# Patient Record
Sex: Female | Born: 1990
Health system: Southern US, Community
[De-identification: ages and names within clinical notes are randomized; demographics above are authoritative.]

## PROBLEM LIST (undated history)

## (undated) DIAGNOSIS — F419 Anxiety disorder, unspecified: Secondary | ICD-10-CM

## (undated) DIAGNOSIS — N979 Female infertility, unspecified: Secondary | ICD-10-CM

## (undated) DIAGNOSIS — A159 Respiratory tuberculosis unspecified: Secondary | ICD-10-CM

## (undated) HISTORY — PX: BREAST SURGERY: SHX581

## (undated) HISTORY — DX: Respiratory tuberculosis unspecified: A15.9

## (undated) HISTORY — DX: Female infertility, unspecified: N97.9

---

## 2017-04-09 ENCOUNTER — Emergency Department (HOSPITAL_COMMUNITY)
Admission: EM | Admit: 2017-04-09 | Discharge: 2017-04-09 | Disposition: A | Discharge status: Home / Self Care | Payer: Self-pay | Attending: Emergency Medicine | Admitting: Emergency Medicine

## 2017-04-09 ENCOUNTER — Encounter (HOSPITAL_COMMUNITY): Payer: Self-pay | Admitting: Emergency Medicine

## 2017-04-09 DIAGNOSIS — B373 Candidiasis of vulva and vagina: Secondary | ICD-10-CM | POA: Insufficient documentation

## 2017-04-09 DIAGNOSIS — B3731 Acute candidiasis of vulva and vagina: Secondary | ICD-10-CM

## 2017-04-09 LAB — WET PREP, GENITAL
Clue Cells Wet Prep HPF POC: NONE SEEN
Sperm: NONE SEEN
Trich, Wet Prep: NONE SEEN

## 2017-04-09 MED ORDER — TERCONAZOLE 0.8 % VA CREA: 1.0000 | TOPICAL_CREAM | Freq: Every day | VAGINAL | 0 refills | Status: DC

## 2017-04-09 MED ORDER — FLUCONAZOLE 150 MG PO TABS
150.0000 mg | ORAL_TABLET | Freq: Once | ORAL | Status: AC
  Administered 2017-04-09: 150 mg via ORAL
  Filled 2017-04-09: qty 1

## 2017-04-09 NOTE — ED Provider Notes (Signed)
[facility] EMERGENCY DEPARTMENT Provider Note   CSN: [id number] Arrival date & time: 04/09/17  1956     History   Chief Complaint Chief Complaint  Patient presents with  . vaginal itching    HPI Nancy Hurst is a 27 y.o. female who presents to the ED for vaginal itching and burning for 2 days. Patient denies vaginal bleeding or discharge. Patient is sexually active. No hx of STI's.   HPI  History reviewed. No pertinent past medical history.  There are no active problems to display for this patient.   History reviewed. No pertinent surgical history.  OB History    No data available       Home Medications    Prior to Admission medications   Medication Sig Start Date End Date Taking? Authorizing Provider  terconazole (TERAZOL 3) 0.8 % vaginal cream Place 1 applicator vaginally at bedtime. 04/09/17   Janne Napoleon, NP    Family History No family history on file.  Social History Social History   Tobacco Use  . Smoking status: Never Smoker  . Smokeless tobacco: Never Used  Substance Use Topics  . Alcohol use: No    Frequency: Never  . Drug use: No     Allergies   Patient has no allergy information on record.   Review of Systems Review of Systems  Genitourinary: Positive for vaginal pain.  All other systems reviewed and are negative.    Physical Exam Updated Vital Signs BP 107/64 (BP Location: Right Arm)   Pulse 68   Temp 98.4 F (36.9 C) (Oral)   Resp 12   Ht 5\' 4"  (1.626 m)   Wt 43 kg (94 lb 12.8 oz)   LMP 03/16/2017 (Exact Date)   SpO2 100%   BMI 16.27 kg/m   Physical Exam  Constitutional: She appears well-developed and well-nourished. No distress.  HENT:  Head: Normocephalic.  Eyes: EOM are normal.  Neck: Neck supple.  Cardiovascular: Normal rate.  Pulmonary/Chest: Effort normal.  Abdominal: Soft. There is no tenderness.  Genitourinary:  Genitourinary Comments: External genitalia without lesions, thick white  cheesy d/c vaginal vault. No CMT, no adnexal tenderness, uterus not enlarged.   Musculoskeletal: Normal range of motion.  Neurological: She is alert.  Skin: Skin is warm and dry.  Psychiatric: She has a normal mood and affect.  Nursing note and vitals reviewed.    ED Treatments / Results  Labs (all labs ordered are listed, but only abnormal results are displayed) Labs Reviewed  WET PREP, GENITAL - Abnormal; Notable for the following components:      Result Value   Yeast Wet Prep HPF POC PRESENT (*)    WBC, Wet Prep HPF POC MANY (*)    All other components within normal limits  GC/CHLAMYDIA PROBE AMP ([place]) NOT AT [facility]  WET PREP  (BD AFFIRM) ([place])    Radiology No results found.  Procedures Procedures (including critical care time)  Medications Ordered in ED Medications  fluconazole (DIFLUCAN) tablet 150 mg (150 mg Oral Given 04/09/17 2253)     Initial Impression / Assessment and Plan / ED Course  I have reviewed the triage vital signs and the nursing notes. 27 y.o. female with vaginal itching stable for d/c. Will treat for yeast infection. Discussed with the patient that cultures are pending and someone will call if she needs additional treatment.   Final Clinical Impressions(s) / ED Diagnoses   Final diagnoses:  Monilial vaginitis  ED Discharge Orders        Ordered    terconazole (TERAZOL 3) 0.8 % vaginal cream  Daily at bedtime     04/09/17 2228       Kerrie Buffaloeese, [place], [place] 04/09/17 2255    Margarita Grizzleay, Danielle, MD 04/10/17 [phone]

## 2017-04-09 NOTE — ED Triage Notes (Signed)
Reports having vaginal itching and burning for two days.  Denies having any discharge.

## 2017-04-11 LAB — GC/CHLAMYDIA PROBE AMP (~~LOC~~) NOT AT ARMC
Chlamydia: NEGATIVE
Neisseria Gonorrhea: NEGATIVE

## 2017-10-31 ENCOUNTER — Emergency Department (HOSPITAL_COMMUNITY): Payer: Self-pay

## 2017-10-31 ENCOUNTER — Emergency Department (HOSPITAL_COMMUNITY)
Admission: EM | Admit: 2017-10-31 | Discharge: 2017-10-31 | Disposition: A | Discharge status: Home / Self Care | Payer: Self-pay | Attending: Emergency Medicine | Admitting: Emergency Medicine

## 2017-10-31 ENCOUNTER — Encounter (HOSPITAL_COMMUNITY): Payer: Self-pay | Admitting: Emergency Medicine

## 2017-10-31 DIAGNOSIS — N739 Female pelvic inflammatory disease, unspecified: Secondary | ICD-10-CM | POA: Insufficient documentation

## 2017-10-31 LAB — COMPREHENSIVE METABOLIC PANEL
ALT: 11 U/L (ref 0–44)
AST: 18 U/L (ref 15–41)
Albumin: 4 g/dL (ref 3.5–5.0)
Alkaline Phosphatase: 39 U/L (ref 38–126)
Anion gap: 10 (ref 5–15)
BUN: 13 mg/dL (ref 6–20)
CHLORIDE: 107 mmol/L (ref 98–111)
CO2: 22 mmol/L (ref 22–32)
CREATININE: 0.6 mg/dL (ref 0.44–1.00)
Calcium: 9 mg/dL (ref 8.9–10.3)
GFR calc non Af Amer: >60 mL/min (ref >60)
Glucose, Bld: 104 mg/dL — ABNORMAL HIGH (ref 70–99)
Potassium: 3.6 mmol/L (ref 3.5–5.1)
SODIUM: 139 mmol/L (ref 135–145)
Total Bilirubin: 0.8 mg/dL (ref 0.3–1.2)
Total Protein: 8 g/dL (ref 6.5–8.1)

## 2017-10-31 LAB — CBC
HCT: 39.2 % (ref 36.0–46.0)
Hemoglobin: 13.1 g/dL (ref 12.0–15.0)
MCH: 29.8 pg (ref 26.0–34.0)
MCHC: 33.4 g/dL (ref 30.0–36.0)
MCV: 89.3 fL (ref 78.0–100.0)
PLATELETS: 429 10*3/uL — AB (ref 150–400)
RBC: 4.39 MIL/uL (ref 3.87–5.11)
RDW: 14.3 % (ref 11.5–15.5)
WBC: 12.6 10*3/uL — ABNORMAL HIGH (ref 4.0–10.5)

## 2017-10-31 LAB — URINALYSIS, ROUTINE W REFLEX MICROSCOPIC
BILIRUBIN URINE: NEGATIVE
Bacteria, UA: NONE SEEN
GLUCOSE, UA: NEGATIVE mg/dL
KETONES UR: 5 mg/dL — AB
NITRITE: NEGATIVE
PH: 5 (ref 5.0–8.0)
Protein, ur: 100 mg/dL — AB
SPECIFIC GRAVITY, URINE: 1.026 (ref 1.005–1.030)

## 2017-10-31 LAB — WET PREP, GENITAL
CLUE CELLS WET PREP: NONE SEEN
SPERM: NONE SEEN
Trich, Wet Prep: NONE SEEN
Yeast Wet Prep HPF POC: NONE SEEN

## 2017-10-31 LAB — LIPASE, BLOOD: LIPASE: 30 U/L (ref 11–51)

## 2017-10-31 LAB — I-STAT BETA HCG BLOOD, ED (MC, WL, AP ONLY): I-stat hCG, quantitative: <5 m[IU]/mL (ref <5)

## 2017-10-31 MED ORDER — DOXYCYCLINE HYCLATE 100 MG PO TABS
100.0000 mg | ORAL_TABLET | Freq: Once | ORAL | Status: AC
  Administered 2017-10-31: 100 mg via ORAL
  Filled 2017-10-31: qty 1

## 2017-10-31 MED ORDER — KETOROLAC TROMETHAMINE 30 MG/ML IJ SOLN
30.0000 mg | Freq: Once | INTRAMUSCULAR | Status: AC
  Administered 2017-10-31: 30 mg via INTRAVENOUS
  Filled 2017-10-31: qty 1

## 2017-10-31 MED ORDER — ONDANSETRON 4 MG PO TBDP
4.0000 mg | ORAL_TABLET | Freq: Once | ORAL | Status: AC | PRN
  Administered 2017-10-31: 4 mg via ORAL
  Filled 2017-10-31: qty 1

## 2017-10-31 MED ORDER — NAPROXEN 500 MG PO TABS: 500.0000 mg | ORAL_TABLET | Freq: Two times a day (BID) | ORAL | 0 refills | Status: AC

## 2017-10-31 MED ORDER — MORPHINE SULFATE (PF) 4 MG/ML IV SOLN
4.0000 mg | Freq: Once | INTRAVENOUS | Status: AC
  Administered 2017-10-31: 4 mg via INTRAVENOUS
  Filled 2017-10-31: qty 1

## 2017-10-31 MED ORDER — IOPAMIDOL (ISOVUE-300) INJECTION 61%
INTRAVENOUS | Status: AC
  Filled 2017-10-31: qty 100

## 2017-10-31 MED ORDER — IOPAMIDOL (ISOVUE-300) INJECTION 61%
100.0000 mL | Freq: Once | INTRAVENOUS | Status: AC | PRN
  Administered 2017-10-31: 100 mL via INTRAVENOUS

## 2017-10-31 MED ORDER — CEFTRIAXONE SODIUM 250 MG IJ SOLR
250.0000 mg | Freq: Once | INTRAMUSCULAR | Status: AC
  Administered 2017-10-31: 250 mg via INTRAMUSCULAR
  Filled 2017-10-31: qty 250

## 2017-10-31 MED ORDER — ONDANSETRON 4 MG PO TBDP: 4.0000 mg | ORAL_TABLET | Freq: Three times a day (TID) | ORAL | 0 refills | Status: DC | PRN

## 2017-10-31 MED ORDER — AZITHROMYCIN 250 MG PO TABS
1000.0000 mg | ORAL_TABLET | Freq: Once | ORAL | Status: AC
  Administered 2017-10-31: 1000 mg via ORAL
  Filled 2017-10-31: qty 4

## 2017-10-31 MED ORDER — DOXYCYCLINE HYCLATE 100 MG PO CAPS: 100.0000 mg | ORAL_CAPSULE | Freq: Two times a day (BID) | ORAL | 0 refills | Status: AC

## 2017-10-31 MED ORDER — SODIUM CHLORIDE 0.9 % IV BOLUS
1000.0000 mL | Freq: Once | INTRAVENOUS | Status: AC
  Administered 2017-10-31: 1000 mL via INTRAVENOUS

## 2017-10-31 NOTE — Discharge Instructions (Addendum)
You were evaluated in the emergency room for abdominal pain.   You have pelvic inflammatory disease.  Your gonorrhea and chlamydia tests are pending, you will be called in 2-3 days if they are positive only.    Take doxycyline until completed.   For pain you can take acetaminophen (tylenol) [phone] mg every 6 hours PLUS naproxen 500 mg every 12 hours.   Return for fevers, chills, sweats, worsening pain.  Usted fue evaluado en la sala de emergencias por dolor abdominal.   Tiene enfermedad inflamatoria plvica. Sus pruebas de [place] y clamidia estn pendientes, se Engineer, structuralle llamar en 2-3 das si solo son positivas. Tome Albertson'sdoxiciclina hasta que se complete.   Para el dolor, puede tomar acetaminophen (tylenol) [phone] mg cada 6 horas MS naproxeno 500 mg cada 12 horas. Regrese por fiebres, escalofros, sudores, empeoramiento del dolor.

## 2017-10-31 NOTE — ED Notes (Signed)
Patient transported to CT 

## 2017-10-31 NOTE — ED Triage Notes (Signed)
Per [facility] pt from home for abd pain with nausea. Pt currently on her menstrual cycle.

## 2017-10-31 NOTE — ED Provider Notes (Signed)
[facility]-EMERGENCY DEPT Provider Note   CSN: [id number] Arrival date & time: 10/31/17  1434     History   Chief Complaint Chief Complaint  Patient presents with  . Abdominal Pain    HPI Nancy Hurst is a 27 y.o. female with history of colitis is here for evaluation of abdominal pain.  Pain is described as severe, constant, localized to the lower abdomen and around the bellybutton.  Pain began at 2 AM.  She took Advil without relief.  Associated symptoms include nausea, none bilious nonbloody emesis.  Reports history of colitis many years ago and this feels similar but slightly worse.  Has not had anything to eat today.  Denies fevers, hematemesis, changes in bowel movements, dysuria, urinary frequency, bladder pressure, abnormal vaginal discharge or bleeding.  Last BM was 7 AM today, normal.  Menstrual cycle began yesterday.  She is sexually active with one female partner without condom use.  Remote h/o of pelvic infection but not sure what type.  4 family members at bedside. Spanish speaking only. No previous pregnancies. No recent pelvic procedures.   HPI  History reviewed. No pertinent past medical history.  There are no active problems to display for this patient.   History reviewed. No pertinent surgical history.   OB History   None      Home Medications    Prior to Admission medications   Medication Sig Start Date End Date Taking? Authorizing Provider  ibuprofen (ADVIL,MOTRIN) 200 MG tablet Take 600 mg by mouth every 6 (six) hours as needed for mild pain.   Yes [provider]  doxycycline (VIBRAMYCIN) 100 MG capsule Take 1 capsule (100 mg total) by mouth 2 (two) times daily for 14 days. 10/31/17 11/14/17  Liberty Handy, PA-C  naproxen (NAPROSYN) 500 MG tablet Take 1 tablet (500 mg total) by mouth 2 (two) times daily for 14 days. 10/31/17 11/14/17  Liberty Handy, PA-C  ondansetron (ZOFRAN ODT) 4 MG disintegrating  tablet Take 1 tablet (4 mg total) by mouth every 8 (eight) hours as needed for nausea or vomiting. 10/31/17   Liberty Handy, PA-C    Family History No family history on file.  Social History Social History   Tobacco Use  . Smoking status: Not on file  Substance Use Topics  . Alcohol use: Not on file  . Drug use: Not on file     Allergies   Patient has no known allergies.   Review of Systems Review of Systems  Gastrointestinal: Positive for abdominal pain, nausea and vomiting.  Genitourinary: Positive for vaginal bleeding (menstrual cycle).  All other systems reviewed and are negative.    Physical Exam Updated Vital Signs BP (!) 87/59   Pulse 94   Resp 18   LMP 10/31/2017   SpO2 100%   Physical Exam  Constitutional: She is oriented to person, place, and time. She appears well-developed and well-nourished.  Nontoxic but appears uncomfortable.  HENT:  Head: Normocephalic and atraumatic.  Nose: Nose normal.  Eyes: Pupils are equal, round, and reactive to light. Conjunctivae and EOM are normal.  Neck: Normal range of motion.  Cardiovascular: Normal rate and regular rhythm.  Pulmonary/Chest: Effort normal and breath sounds normal.  Abdominal: Soft. Bowel sounds are normal. There is tenderness in the right lower quadrant, periumbilical area, suprapubic area and left lower quadrant.  Diffuse lower abdominal tenderness.  Mild guarding throughout.  Positive McBurney's.  Active bowel sounds to lower quadrants.  Soft.  No CVA tenderness.  Genitourinary: Cervix exhibits motion tenderness. There is bleeding in the vagina.  Genitourinary Comments:  External genitalia normal without tenderness, lesions.  No groin lymphadenopathy.  Cervix closed with dark blood, moderate blood in vaginal vault.  Marked CMT.  Non palpable adnexa. Pelvic done with RN at bedside.  Musculoskeletal: Normal range of motion.  Neurological: She is alert and oriented to person, place, and time.    Skin: Skin is warm and dry. Capillary refill takes less than 2 seconds.  Psychiatric: She has a normal mood and affect. Her behavior is normal.  Nursing note and vitals reviewed.    ED Treatments / Results  Labs (all labs ordered are listed, but only abnormal results are displayed) Labs Reviewed  WET PREP, GENITAL - Abnormal; Notable for the following components:      Result Value   WBC, Wet Prep HPF POC FEW (*)    All other components within normal limits  COMPREHENSIVE METABOLIC PANEL - Abnormal; Notable for the following components:   Glucose, Bld 104 (*)    All other components within normal limits  CBC - Abnormal; Notable for the following components:   WBC 12.6 (*)    Platelets 429 (*)    All other components within normal limits  URINALYSIS, ROUTINE W REFLEX MICROSCOPIC - Abnormal; Notable for the following components:   APPearance CLOUDY (*)    Hgb urine dipstick LARGE (*)    Ketones, ur 5 (*)    Protein, ur 100 (*)    Leukocytes, UA TRACE (*)    RBC / HPF >50 (*)    All other components within normal limits  LIPASE, BLOOD  HIV ANTIBODY (ROUTINE TESTING W REFLEX)  I-STAT BETA HCG BLOOD, ED ([facility], [facility], [facility] ONLY)  GC/CHLAMYDIA PROBE AMP ([place]    EKG None  Radiology Ct Abdomen Pelvis W Contrast  Result Date: 10/31/2017 CLINICAL DATA:  Lower abdominal pain.  Nausea.  Leukocytosis. EXAM: CT ABDOMEN AND PELVIS WITH CONTRAST TECHNIQUE: Multidetector CT imaging of the abdomen and pelvis was performed using the standard protocol following bolus administration of intravenous contrast. CONTRAST:  100mL ISOVUE-300 IOPAMIDOL (ISOVUE-300) INJECTION 61% COMPARISON:  None. FINDINGS: Lower chest: No significant pulmonary nodules or acute consolidative airspace disease. Hepatobiliary: Normal liver size. No liver mass. Normal gallbladder with no radiopaque cholelithiasis. No biliary ductal dilatation. Pancreas: Normal, with no mass or duct dilation. Spleen: Normal  size. No mass. Adrenals/Urinary Tract: Normal adrenals. Normal kidneys with no hydronephrosis and no renal mass. Normal bladder. Stomach/Bowel: Normal non-distended stomach. Normal caliber small bowel. Mild wall thickening within a few pelvic small bowel loops. Prominent edema in the lower small bowel mesentery and lower omentum. Mild wall thickening and wall hyperenhancement throughout the appendix with gas noted within the appendiceal lumen. Mild wall thickening in the cecum. Otherwise normal large bowel with no diverticulosis. Vascular/Lymphatic: Normal caliber abdominal aorta. Patent portal, splenic, hepatic and renal veins. No pathologically enlarged lymph nodes in the abdomen or pelvis. Reproductive: There is extensive thickening and hyperenhancement of the pelvic peritoneum. Moderate volume free fluid throughout the pelvis. Serosal enhancement throughout the uterus and adnexal structures. No adnexal mass. Other: No pneumoperitoneum. Musculoskeletal: No aggressive appearing focal osseous lesions. IMPRESSION: 1. Extensive thickening and hyperenhancement throughout the pelvic peritoneum. Moderate volume free fluid throughout the pelvis. Serosal enhancement throughout the uterus and adnexal structures. Prominent edema in the lower omentum and lower small bowel mesentery. This spectrum of findings is most compatible with acute pelvic  inflammatory disease (PID). No evidence of tubo-ovarian abscess. 2. Mild wall thickening in the pelvic small bowel loops, appendix and cecum, more likely reactive. Electronically Signed   By: Delbert Phenix M.D.   On: 10/31/2017 19:10    Procedures Procedures (including critical care time)  Medications Ordered in ED Medications  ondansetron (ZOFRAN-ODT) disintegrating tablet 4 mg (4 mg Oral Given 10/31/17 1731)  morphine 4 MG/ML injection 4 mg (4 mg Intravenous Given 10/31/17 1733)  sodium chloride 0.9 % bolus 1,000 mL (0 mLs Intravenous Stopped 10/31/17 2309)  iopamidol  (ISOVUE-300) 61 % injection 100 mL (100 mLs Intravenous Contrast Given 10/31/17 1831)  ketorolac (TORADOL) 30 MG/ML injection 30 mg (30 mg Intravenous Given 10/31/17 2108)  cefTRIAXone (ROCEPHIN) injection 250 mg (250 mg Intramuscular Given 10/31/17 2256)  doxycycline (VIBRA-TABS) tablet 100 mg (100 mg Oral Given 10/31/17 2257)  azithromycin (ZITHROMAX) tablet 1,000 mg (1,000 mg Oral Given 10/31/17 2256)     Initial Impression / Assessment and Plan / ED Course  I have reviewed the triage vital signs and the nursing notes.  Pertinent labs & imaging results that were available during my care of the patient were reviewed by me and considered in my medical decision making (see chart for details).  Clinical Course as of Nov 01 2310  Mon Oct 31, 2017  1700 WBC(!): 12.6 [CG]  1700 Hgb urine dipstick(!): LARGE [CG]  1700 Protein(!): 100 [CG]  1700 Leukocytes, UA(!): TRACE [CG]  1939 IMPRESSION: 1. Extensive thickening and hyperenhancement throughout the pelvic peritoneum. Moderate volume free fluid throughout the pelvis. Serosal enhancement throughout the uterus and adnexal structures. Prominent edema in the lower omentum and lower small bowel mesentery. This spectrum of findings is most compatible with acute pelvic inflammatory disease (PID). No evidence of tubo-ovarian abscess. 2. Mild wall thickening in the pelvic small bowel loops, appendix and cecum, more likely reactive.  CT ABDOMEN PELVIS W CONTRAST [CG]    Clinical Course User Index [CG] Liberty Handy, PA-C   27 year old female here with periumbilical and lower abdominal pain, nausea, vomiting.  No fever.  No hematemesis, changes in bowel movements, abnormal vaginal or urinary symptoms.  Considering appendicitis versus viral gastroenteritis versus renal stone.  She reports previous history of colitis and pelvic infection but does not know what type.  Given age and sexual practices, PID, ectopic pregnancy, diverticulitis also  considered.  On exam she appears uncomfortable but nontoxic, she has periumbilical and positive McBurney's tenderness.  No peritonitis.  Will obtain screening labs, reassess.  2120: CT scan shows extensive thickening and hyperenhancement of the pelvic peritoneum, moderate free fluid consistent with acute pelvic inflammatory disease without abscess.  There is local reactive changes to the small bowel, appendix and cecum.  I reevaluated patient and discussed results with patient and mother at bedside.  Reports mild soreness diffusely but much improved.  No emesis.  Remains afebrile.  Pelvic exam was performed as above suggestive of PID with exquisite CMT.  Wet prep pending.  2310: Wet prep swab had to be repeated, few WBC noted otherwise unremarkable.  Patient's pain is tolerable and she feels comfortable with discharge.  DC with doxycycline, Zofran, naproxen.  Discussed specific return precautions with patient and mother.  They verbalized understanding. Final Clinical Impressions(s) / ED Diagnoses   Final diagnoses:  Pelvic inflammatory disease    ED Discharge Orders         Ordered    doxycycline (VIBRAMYCIN) 100 MG capsule  2 times daily  10/31/17 2124    naproxen (NAPROSYN) 500 MG tablet  2 times daily     10/31/17 2124    ondansetron (ZOFRAN ODT) 4 MG disintegrating tablet  Every 8 hours PRN     10/31/17 2124           Jerrell Mylar 10/31/17 2312    Lorre Nick, MD 10/31/17 [phone]

## 2017-10-31 NOTE — ED Notes (Signed)
Contrast given at CT. RN linked it to order.

## 2017-11-01 ENCOUNTER — Other Ambulatory Visit: Payer: Self-pay | Admitting: Emergency Medicine

## 2017-11-01 ENCOUNTER — Encounter (HOSPITAL_COMMUNITY): Payer: Self-pay | Admitting: Emergency Medicine

## 2017-11-01 LAB — CERVICOVAGINAL ANCILLARY ONLY
Chlamydia: NEGATIVE
Neisseria Gonorrhea: NEGATIVE

## 2017-11-01 LAB — HIV ANTIBODY (ROUTINE TESTING W REFLEX): HIV SCREEN 4TH GENERATION: NONREACTIVE

## 2017-11-02 ENCOUNTER — Other Ambulatory Visit: Payer: Self-pay

## 2017-11-02 ENCOUNTER — Encounter (HOSPITAL_COMMUNITY): Payer: Self-pay

## 2017-11-02 DIAGNOSIS — I96 Gangrene, not elsewhere classified: Secondary | ICD-10-CM | POA: Diagnosis present

## 2017-11-02 DIAGNOSIS — K3533 Acute appendicitis with perforation and localized peritonitis, with abscess: Principal | ICD-10-CM | POA: Diagnosis present

## 2017-11-02 DIAGNOSIS — Z79899 Other long term (current) drug therapy: Secondary | ICD-10-CM

## 2017-11-02 DIAGNOSIS — R300 Dysuria: Secondary | ICD-10-CM | POA: Diagnosis not present

## 2017-11-02 LAB — I-STAT BETA HCG BLOOD, ED (MC, WL, AP ONLY)

## 2017-11-02 LAB — COMPREHENSIVE METABOLIC PANEL
ALK PHOS: 57 U/L (ref 38–126)
ALT: 11 U/L (ref 0–44)
ANION GAP: 8 (ref 5–15)
AST: 16 U/L (ref 15–41)
Albumin: 3.7 g/dL (ref 3.5–5.0)
BILIRUBIN TOTAL: 0.7 mg/dL (ref 0.3–1.2)
BUN: 9 mg/dL (ref 6–20)
CALCIUM: 9 mg/dL (ref 8.9–10.3)
CO2: 23 mmol/L (ref 22–32)
Chloride: 107 mmol/L (ref 98–111)
Creatinine, Ser: 0.71 mg/dL (ref 0.44–1.00)
GFR calc non Af Amer: >60 mL/min (ref >60)
Glucose, Bld: 112 mg/dL — ABNORMAL HIGH (ref 70–99)
Potassium: 3.5 mmol/L (ref 3.5–5.1)
Sodium: 138 mmol/L (ref 135–145)
TOTAL PROTEIN: 7.7 g/dL (ref 6.5–8.1)

## 2017-11-02 LAB — CBC
HCT: 34.2 % — ABNORMAL LOW (ref 36.0–46.0)
Hemoglobin: 11.6 g/dL — ABNORMAL LOW (ref 12.0–15.0)
MCH: 30 pg (ref 26.0–34.0)
MCHC: 33.9 g/dL (ref 30.0–36.0)
MCV: 88.4 fL (ref 78.0–100.0)
Platelets: 412 K/uL — ABNORMAL HIGH (ref 150–400)
RBC: 3.87 MIL/uL (ref 3.87–5.11)
RDW: 14.4 % (ref 11.5–15.5)
WBC: 23.6 K/uL — ABNORMAL HIGH (ref 4.0–10.5)

## 2017-11-02 LAB — LIPASE, BLOOD: Lipase: 36 U/L (ref 11–51)

## 2017-11-02 NOTE — ED Triage Notes (Signed)
Pt was seen yesterday Monday for fever and diarrhea no nausea. Pt family accompanied pt and reports that she had a fever today and almost passed out. Pt reports taking tylenol at 3 pm . Pt is unable to keep food or fluids down  ** Pt was started on abx on Monday. Pt does report not eating prior to taking medication.

## 2017-11-03 ENCOUNTER — Inpatient Hospital Stay (HOSPITAL_COMMUNITY)
Admission: EM | Admit: 2017-11-03 | Discharge: 2017-11-06 | DRG: 339 | Disposition: A | Discharge status: Home / Self Care | Payer: Self-pay | Attending: General Surgery | Admitting: General Surgery

## 2017-11-03 ENCOUNTER — Encounter (HOSPITAL_COMMUNITY): Payer: Self-pay

## 2017-11-03 ENCOUNTER — Emergency Department (HOSPITAL_COMMUNITY): Payer: Self-pay | Admitting: Anesthesiology

## 2017-11-03 ENCOUNTER — Emergency Department (HOSPITAL_COMMUNITY): Payer: Self-pay

## 2017-11-03 ENCOUNTER — Encounter (HOSPITAL_COMMUNITY): Admission: EM | Disposition: A | Discharge status: Home / Self Care | Payer: Self-pay | Source: Home / Self Care

## 2017-11-03 DIAGNOSIS — K3580 Unspecified acute appendicitis: Secondary | ICD-10-CM

## 2017-11-03 DIAGNOSIS — K3532 Acute appendicitis with perforation and localized peritonitis, without abscess: Secondary | ICD-10-CM | POA: Diagnosis present

## 2017-11-03 HISTORY — PX: LAPAROSCOPIC APPENDECTOMY: SHX408

## 2017-11-03 LAB — CBC
HEMATOCRIT: 31.5 % — AB (ref 36.0–46.0)
HEMOGLOBIN: 10.7 g/dL — AB (ref 12.0–15.0)
MCH: 30 pg (ref 26.0–34.0)
MCHC: 34 g/dL (ref 30.0–36.0)
MCV: 88.2 fL (ref 78.0–100.0)
Platelets: 415 10*3/uL — ABNORMAL HIGH (ref 150–400)
RBC: 3.57 MIL/uL — AB (ref 3.87–5.11)
RDW: 14.3 % (ref 11.5–15.5)
WBC: 18.7 10*3/uL — AB (ref 4.0–10.5)

## 2017-11-03 LAB — CREATININE, SERUM: Creatinine, Ser: 0.6 mg/dL (ref 0.44–1.00)

## 2017-11-03 SURGERY — APPENDECTOMY, LAPAROSCOPIC
Anesthesia: General

## 2017-11-03 MED ORDER — ONDANSETRON HCL 4 MG/2ML IJ SOLN
4.0000 mg | Freq: Once | INTRAMUSCULAR | Status: AC
  Administered 2017-11-03: 4 mg via INTRAVENOUS
  Filled 2017-11-03: qty 2

## 2017-11-03 MED ORDER — DEXAMETHASONE SODIUM PHOSPHATE 10 MG/ML IJ SOLN
INTRAMUSCULAR | Status: DC | PRN
  Administered 2017-11-03: 10 mg via INTRAVENOUS

## 2017-11-03 MED ORDER — DIPHENHYDRAMINE HCL 50 MG/ML IJ SOLN: 12.5000 mg | Freq: Four times a day (QID) | INTRAMUSCULAR | Status: DC | PRN

## 2017-11-03 MED ORDER — EPHEDRINE 5 MG/ML INJ
INTRAVENOUS | Status: AC
  Filled 2017-11-03: qty 10

## 2017-11-03 MED ORDER — SENNA 8.6 MG PO TABS
1.0000 | ORAL_TABLET | Freq: Two times a day (BID) | ORAL | Status: DC
  Administered 2017-11-03 – 2017-11-05 (×4): 8.6 mg via ORAL
  Filled 2017-11-03 (×5): qty 1

## 2017-11-03 MED ORDER — EPHEDRINE SULFATE-NACL 50-0.9 MG/10ML-% IV SOSY
PREFILLED_SYRINGE | INTRAVENOUS | Status: DC | PRN
  Administered 2017-11-03: 5 mg via INTRAVENOUS

## 2017-11-03 MED ORDER — PROCHLORPERAZINE EDISYLATE 10 MG/2ML IJ SOLN
5.0000 mg | Freq: Four times a day (QID) | INTRAMUSCULAR | Status: DC | PRN
  Administered 2017-11-05: 10 mg via INTRAVENOUS
  Administered 2017-11-05: 5 mg via INTRAVENOUS
  Filled 2017-11-03 (×2): qty 2

## 2017-11-03 MED ORDER — DEXAMETHASONE SODIUM PHOSPHATE 10 MG/ML IJ SOLN
INTRAMUSCULAR | Status: AC
  Filled 2017-11-03: qty 1

## 2017-11-03 MED ORDER — ACETAMINOPHEN 650 MG RE SUPP: 650.0000 mg | Freq: Four times a day (QID) | RECTAL | Status: DC | PRN

## 2017-11-03 MED ORDER — DOXYCYCLINE HYCLATE 100 MG PO CAPS: 100.0000 mg | ORAL_CAPSULE | Freq: Two times a day (BID) | ORAL | Status: DC

## 2017-11-03 MED ORDER — FENTANYL CITRATE (PF) 100 MCG/2ML IJ SOLN
25.0000 ug | INTRAMUSCULAR | Status: DC | PRN
  Administered 2017-11-03: 50 ug via INTRAVENOUS
  Administered 2017-11-03 (×2): 25 ug via INTRAVENOUS

## 2017-11-03 MED ORDER — MORPHINE SULFATE (PF) 4 MG/ML IV SOLN
4.0000 mg | Freq: Once | INTRAVENOUS | Status: AC
  Administered 2017-11-03: 4 mg via INTRAVENOUS
  Filled 2017-11-03: qty 1

## 2017-11-03 MED ORDER — METRONIDAZOLE IN NACL 5-0.79 MG/ML-% IV SOLN
500.0000 mg | Freq: Three times a day (TID) | INTRAVENOUS | Status: DC
  Administered 2017-11-03 – 2017-11-06 (×8): 500 mg via INTRAVENOUS
  Filled 2017-11-03 (×8): qty 100

## 2017-11-03 MED ORDER — DEXTROSE IN LACTATED RINGERS 5 % IV SOLN
INTRAVENOUS | Status: DC
  Administered 2017-11-03 – 2017-11-04 (×3): via INTRAVENOUS

## 2017-11-03 MED ORDER — GABAPENTIN 300 MG PO CAPS
300.0000 mg | ORAL_CAPSULE | Freq: Two times a day (BID) | ORAL | Status: DC
  Administered 2017-11-03 – 2017-11-05 (×4): 300 mg via ORAL
  Filled 2017-11-03 (×5): qty 1

## 2017-11-03 MED ORDER — IOHEXOL 300 MG/ML  SOLN
75.0000 mL | Freq: Once | INTRAMUSCULAR | Status: AC | PRN
  Administered 2017-11-03: 75 mL via INTRAVENOUS

## 2017-11-03 MED ORDER — KETOROLAC TROMETHAMINE 30 MG/ML IJ SOLN
INTRAMUSCULAR | Status: AC
  Filled 2017-11-03: qty 1

## 2017-11-03 MED ORDER — PHENYLEPHRINE 40 MCG/ML (10ML) SYRINGE FOR IV PUSH (FOR BLOOD PRESSURE SUPPORT)
PREFILLED_SYRINGE | INTRAVENOUS | Status: DC | PRN
  Administered 2017-11-03 (×6): 80 ug via INTRAVENOUS

## 2017-11-03 MED ORDER — SUCCINYLCHOLINE CHLORIDE 200 MG/10ML IV SOSY
PREFILLED_SYRINGE | INTRAVENOUS | Status: AC
  Filled 2017-11-03: qty 10

## 2017-11-03 MED ORDER — PROMETHAZINE HCL 25 MG/ML IJ SOLN: 6.2500 mg | INTRAMUSCULAR | Status: DC | PRN

## 2017-11-03 MED ORDER — SODIUM CHLORIDE 0.9 % IV SOLN
1.0000 g | Freq: Once | INTRAVENOUS | Status: AC
  Administered 2017-11-03: 1 g via INTRAVENOUS
  Filled 2017-11-03: qty 10

## 2017-11-03 MED ORDER — MIDAZOLAM HCL 2 MG/2ML IJ SOLN
INTRAMUSCULAR | Status: AC
  Filled 2017-11-03: qty 2

## 2017-11-03 MED ORDER — PROPOFOL 10 MG/ML IV BOLUS
INTRAVENOUS | Status: AC
  Filled 2017-11-03: qty 20

## 2017-11-03 MED ORDER — KETOROLAC TROMETHAMINE 30 MG/ML IJ SOLN
30.0000 mg | Freq: Four times a day (QID) | INTRAMUSCULAR | Status: DC
  Administered 2017-11-03 – 2017-11-04 (×3): 30 mg via INTRAVENOUS
  Filled 2017-11-03 (×3): qty 1

## 2017-11-03 MED ORDER — ROCURONIUM BROMIDE 10 MG/ML (PF) SYRINGE
PREFILLED_SYRINGE | INTRAVENOUS | Status: AC
  Filled 2017-11-03: qty 10

## 2017-11-03 MED ORDER — SIMETHICONE 80 MG PO CHEW: 40.0000 mg | CHEWABLE_TABLET | Freq: Four times a day (QID) | ORAL | Status: DC | PRN

## 2017-11-03 MED ORDER — ACETAMINOPHEN 325 MG PO TABS: 650.0000 mg | ORAL_TABLET | Freq: Four times a day (QID) | ORAL | Status: DC | PRN

## 2017-11-03 MED ORDER — SODIUM CHLORIDE 0.9 % IV BOLUS
1000.0000 mL | Freq: Once | INTRAVENOUS | Status: AC
  Administered 2017-11-03: 1000 mL via INTRAVENOUS

## 2017-11-03 MED ORDER — ROCURONIUM BROMIDE 50 MG/5ML IV SOSY
PREFILLED_SYRINGE | INTRAVENOUS | Status: DC | PRN
  Administered 2017-11-03: 35 mg via INTRAVENOUS

## 2017-11-03 MED ORDER — SUGAMMADEX SODIUM 200 MG/2ML IV SOLN
INTRAVENOUS | Status: DC | PRN
  Administered 2017-11-03: 100 mg via INTRAVENOUS

## 2017-11-03 MED ORDER — HYDROMORPHONE HCL 1 MG/ML IJ SOLN
0.2500 mg | INTRAMUSCULAR | Status: DC | PRN
  Administered 2017-11-03 (×6): 0.5 mg via INTRAVENOUS

## 2017-11-03 MED ORDER — LIDOCAINE HCL (PF) 1 % IJ SOLN
INTRAMUSCULAR | Status: DC | PRN
  Administered 2017-11-03: 7.5 mL

## 2017-11-03 MED ORDER — LACTATED RINGERS IV SOLN
INTRAVENOUS | Status: DC
  Administered 2017-11-03 (×2): via INTRAVENOUS

## 2017-11-03 MED ORDER — OXYCODONE HCL 5 MG PO TABS
5.0000 mg | ORAL_TABLET | ORAL | Status: DC | PRN
  Administered 2017-11-03: 5 mg via ORAL
  Administered 2017-11-04: 10 mg via ORAL
  Filled 2017-11-03 (×2): qty 2
  Filled 2017-11-03: qty 1

## 2017-11-03 MED ORDER — METHOCARBAMOL 500 MG PO TABS: 500.0000 mg | ORAL_TABLET | Freq: Four times a day (QID) | ORAL | Status: DC | PRN

## 2017-11-03 MED ORDER — SUGAMMADEX SODIUM 200 MG/2ML IV SOLN
INTRAVENOUS | Status: AC
  Filled 2017-11-03: qty 2

## 2017-11-03 MED ORDER — ONDANSETRON HCL 4 MG/2ML IJ SOLN
INTRAMUSCULAR | Status: AC
  Filled 2017-11-03: qty 2

## 2017-11-03 MED ORDER — SUCCINYLCHOLINE CHLORIDE 200 MG/10ML IV SOSY
PREFILLED_SYRINGE | INTRAVENOUS | Status: DC | PRN
  Administered 2017-11-03: 100 mg via INTRAVENOUS

## 2017-11-03 MED ORDER — LIDOCAINE HCL (PF) 1 % IJ SOLN
INTRAMUSCULAR | Status: AC
  Filled 2017-11-03: qty 30

## 2017-11-03 MED ORDER — LIDOCAINE 2% (20 MG/ML) 5 ML SYRINGE
INTRAMUSCULAR | Status: AC
  Filled 2017-11-03: qty 5

## 2017-11-03 MED ORDER — METRONIDAZOLE IN NACL 5-0.79 MG/ML-% IV SOLN
500.0000 mg | Freq: Once | INTRAVENOUS | Status: AC
  Administered 2017-11-03: 500 mg via INTRAVENOUS
  Filled 2017-11-03: qty 100

## 2017-11-03 MED ORDER — MIDAZOLAM HCL 5 MG/5ML IJ SOLN
INTRAMUSCULAR | Status: DC | PRN
  Administered 2017-11-03 (×2): 1 mg via INTRAVENOUS

## 2017-11-03 MED ORDER — PROCHLORPERAZINE MALEATE 10 MG PO TABS: 10.0000 mg | ORAL_TABLET | Freq: Four times a day (QID) | ORAL | Status: DC | PRN

## 2017-11-03 MED ORDER — PROPOFOL 10 MG/ML IV BOLUS
INTRAVENOUS | Status: DC | PRN
  Administered 2017-11-03: 120 mg via INTRAVENOUS

## 2017-11-03 MED ORDER — FENTANYL CITRATE (PF) 100 MCG/2ML IJ SOLN
INTRAMUSCULAR | Status: AC
  Filled 2017-11-03: qty 2

## 2017-11-03 MED ORDER — BUPIVACAINE-EPINEPHRINE 0.25% -1:200000 IJ SOLN
INTRAMUSCULAR | Status: DC | PRN
  Administered 2017-11-03: 7.5 mL

## 2017-11-03 MED ORDER — ENOXAPARIN SODIUM 40 MG/0.4ML ~~LOC~~ SOLN
40.0000 mg | SUBCUTANEOUS | Status: DC
  Administered 2017-11-04 – 2017-11-05 (×2): 40 mg via SUBCUTANEOUS
  Filled 2017-11-03 (×3): qty 0.4

## 2017-11-03 MED ORDER — SODIUM CHLORIDE 0.9 % IV SOLN
2.0000 g | INTRAVENOUS | Status: DC
  Administered 2017-11-04 – 2017-11-06 (×3): 2 g via INTRAVENOUS
  Filled 2017-11-03 (×2): qty 2
  Filled 2017-11-03 (×2): qty 20

## 2017-11-03 MED ORDER — PHENYLEPHRINE 40 MCG/ML (10ML) SYRINGE FOR IV PUSH (FOR BLOOD PRESSURE SUPPORT)
PREFILLED_SYRINGE | INTRAVENOUS | Status: AC
  Filled 2017-11-03: qty 10

## 2017-11-03 MED ORDER — METRONIDAZOLE IN NACL 5-0.79 MG/ML-% IV SOLN
INTRAVENOUS | Status: AC
  Filled 2017-11-03: qty 100

## 2017-11-03 MED ORDER — HYDROMORPHONE HCL 1 MG/ML IJ SOLN
0.5000 mg | INTRAMUSCULAR | Status: DC | PRN
  Administered 2017-11-03: 1 mg via INTRAVENOUS
  Filled 2017-11-03: qty 1

## 2017-11-03 MED ORDER — LIDOCAINE 2% (20 MG/ML) 5 ML SYRINGE
INTRAMUSCULAR | Status: DC | PRN
  Administered 2017-11-03: 60 mg via INTRAVENOUS

## 2017-11-03 MED ORDER — LACTATED RINGERS IR SOLN
Status: DC | PRN
  Administered 2017-11-03: 1000 mL

## 2017-11-03 MED ORDER — SCOPOLAMINE 1 MG/3DAYS TD PT72
1.0000 | MEDICATED_PATCH | TRANSDERMAL | Status: DC
  Administered 2017-11-03: 1.5 mg via TRANSDERMAL
  Filled 2017-11-03: qty 1

## 2017-11-03 MED ORDER — KETOROLAC TROMETHAMINE 30 MG/ML IJ SOLN
30.0000 mg | Freq: Four times a day (QID) | INTRAMUSCULAR | Status: DC | PRN
  Administered 2017-11-03: 30 mg via INTRAVENOUS

## 2017-11-03 MED ORDER — DIPHENHYDRAMINE HCL 12.5 MG/5ML PO ELIX: 12.5000 mg | ORAL_SOLUTION | Freq: Four times a day (QID) | ORAL | Status: DC | PRN

## 2017-11-03 MED ORDER — ONDANSETRON HCL 4 MG/2ML IJ SOLN
INTRAMUSCULAR | Status: DC | PRN
  Administered 2017-11-03: 4 mg via INTRAVENOUS

## 2017-11-03 MED ORDER — ONDANSETRON 4 MG PO TBDP
4.0000 mg | ORAL_TABLET | Freq: Three times a day (TID) | ORAL | Status: DC | PRN
  Administered 2017-11-04 – 2017-11-05 (×2): 4 mg via ORAL
  Filled 2017-11-03 (×2): qty 1

## 2017-11-03 MED ORDER — FENTANYL CITRATE (PF) 100 MCG/2ML IJ SOLN
INTRAMUSCULAR | Status: DC | PRN
  Administered 2017-11-03 (×2): 50 ug via INTRAVENOUS
  Administered 2017-11-03: 100 ug via INTRAVENOUS

## 2017-11-03 MED ORDER — BUPIVACAINE-EPINEPHRINE (PF) 0.25% -1:200000 IJ SOLN
INTRAMUSCULAR | Status: AC
  Filled 2017-11-03: qty 30

## 2017-11-03 MED ORDER — HYDROMORPHONE HCL 1 MG/ML IJ SOLN
INTRAMUSCULAR | Status: AC
  Filled 2017-11-03: qty 1

## 2017-11-03 MED ORDER — DOXYCYCLINE HYCLATE 100 MG PO TABS
100.0000 mg | ORAL_TABLET | Freq: Two times a day (BID) | ORAL | Status: DC
  Administered 2017-11-03 – 2017-11-04 (×2): 100 mg via ORAL
  Filled 2017-11-03 (×2): qty 1

## 2017-11-03 MED ORDER — FENTANYL CITRATE (PF) 250 MCG/5ML IJ SOLN
INTRAMUSCULAR | Status: AC
  Filled 2017-11-03: qty 5

## 2017-11-03 MED ORDER — IBUPROFEN 200 MG PO TABS: 600.0000 mg | ORAL_TABLET | Freq: Four times a day (QID) | ORAL | Status: DC | PRN

## 2017-11-03 SURGICAL SUPPLY — 31 items
APPLIER CLIP ROT 10 11.4 M/L (STAPLE)
CABLE HIGH FREQUENCY MONO STRZ (ELECTRODE) ×3 IMPLANT
CLIP APPLIE ROT 10 11.4 M/L (STAPLE) IMPLANT
COVER SURGICAL LIGHT HANDLE (MISCELLANEOUS) ×3 IMPLANT
CUTTER FLEX LINEAR 45M (STAPLE) IMPLANT
DECANTER SPIKE VIAL GLASS SM (MISCELLANEOUS) ×3 IMPLANT
DERMABOND ADVANCED (GAUZE/BANDAGES/DRESSINGS) ×2
DERMABOND ADVANCED .7 DNX12 (GAUZE/BANDAGES/DRESSINGS) ×1 IMPLANT
DRAPE LAPAROSCOPIC ABDOMINAL (DRAPES) ×3 IMPLANT
ELECT REM PT RETURN 15FT ADLT (MISCELLANEOUS) ×3 IMPLANT
ENDOLOOP SUT PDS II  0 18 (SUTURE)
ENDOLOOP SUT PDS II 0 18 (SUTURE) IMPLANT
GLOVE BIO SURGEON STRL SZ 6 (GLOVE) ×3 IMPLANT
GLOVE INDICATOR 6.5 STRL GRN (GLOVE) ×3 IMPLANT
GOWN STRL REUS W/TWL 2XL LVL3 (GOWN DISPOSABLE) ×3 IMPLANT
GOWN STRL REUS W/TWL XL LVL3 (GOWN DISPOSABLE) ×3 IMPLANT
KIT BASIN OR (CUSTOM PROCEDURE TRAY) ×3 IMPLANT
POUCH SPECIMEN RETRIEVAL 10MM (ENDOMECHANICALS) ×3 IMPLANT
RELOAD 45 VASCULAR/THIN (ENDOMECHANICALS) IMPLANT
RELOAD STAPLE TA45 3.5 REG BLU (ENDOMECHANICALS) ×6 IMPLANT
SCISSORS LAP 5X35 DISP (ENDOMECHANICALS) ×3 IMPLANT
SET IRRIG TUBING LAPAROSCOPIC (IRRIGATION / IRRIGATOR) ×3 IMPLANT
SHEARS HARMONIC ACE PLUS 36CM (ENDOMECHANICALS) ×3 IMPLANT
SLEEVE XCEL OPT CAN 5 100 (ENDOMECHANICALS) ×3 IMPLANT
SUT MNCRL AB 4-0 PS2 18 (SUTURE) ×3 IMPLANT
TOWEL OR 17X26 10 PK STRL BLUE (TOWEL DISPOSABLE) ×3 IMPLANT
TRAY FOLEY MTR SLVR 16FR STAT (SET/KITS/TRAYS/PACK) IMPLANT
TRAY LAPAROSCOPIC (CUSTOM PROCEDURE TRAY) ×3 IMPLANT
TROCAR BLADELESS OPT 5 100 (ENDOMECHANICALS) ×3 IMPLANT
TROCAR XCEL BLUNT TIP 100MML (ENDOMECHANICALS) ×3 IMPLANT
TUBING INSUF HEATED (TUBING) ×3 IMPLANT

## 2017-11-03 NOTE — Op Note (Signed)
Appendectomy, Lap, Procedure Note  Indications: The patient presented with a history of right-sided abdominal pain. A CT revealed findings consistent with acute appendicitis.  Pre-operative Diagnosis: Acute appendicitis with abscess  Post-operative Diagnosis: Acute perforated appendicitis with peritoneal abscess  Surgeon: Almond Lint   Assistants: n/a  Anesthesia: General endotracheal anesthesia and Local anesthesia   ASA Class: e  Procedure Details  The patient was seen again in the Holding Room. The risks, benefits, complications, treatment options, and expected outcomes were discussed with the patient and/or family. The possibilities of perforation of viscus, bleeding, recurrent infection, the need for additional procedures, failure to diagnose a condition, and creating a complication requiring transfusion or operation were discussed. There was concurrence with the proposed plan and informed consent was obtained. The site of surgery was properly noted. The patient was taken to Operating Room, identified as Nancy Hurst and the procedure verified as Appendectomy. A Time Out was held and the above information confirmed.  The patient was placed in the supine position and general anesthesia was induced, along with placement of orogastric tube, Venodyne boots, and a Foley catheter. The abdomen was prepped and draped in a sterile fashion. Local anesthetic was infiltrated in the infraumbilical region.  A 1.5 cm curvilinear transverse incision was made just below the umbilicus.  The Kelly clamp was used to spread the subcutaneous tissues.  The fascia was elevated with 2 Kocher clamps and incised with the #11 blade.  A Tresa Endo was used to confirm entrance into the peritoneal cavity.  A pursestring suture was placed around the fascial incision.  The Hasson trocar was inserted into the abdomen and held in place with the tails of the suture.  The pneumoperitoneum was then established to  steady pressure of 15 mmHg.     Additional 5 mm cannulas then placed in the left lower quadrant of the abdomen and the suprapubic region under direct visualization.  A careful evaluation of the entire abdomen was carried out. The patient was placed in Trendelenburg and rotated to the left.  The omentum and small intestines were stuck in the pelvis.  They were gently retracted in the cephalad and left lateral direction away from the pelvis and right lower quadrant.   There was purulent drainage in the pelvis.  This was aspirated.  The small bowel was also stuck in the RLQ.  This was gently pulled away as well, it it came easily.  Pus expressed from near the cecum.  The patient was found to have an enlarged and inflamed appendix that was curled at the base of the cecum.  It appeared gangrenous and perforated.    The appendix was carefully dissected. The appendix was was skeletonized with the harmonic scalpel.   The appendix was divided at its base using an endo-GIA stapler. Minimal appendiceal stump was left in place. The appendix was removed from the abdomen with an Endocatch bag through the umbilical port.  There was no evidence of bleeding, leakage, or complication after division of the appendix. Irrigation was also performed and irrigate suctioned from the abdomen as well.  The 5 mm trocars were removed.  The pneumoperitoneum was evacuated from the abdomen.    The trocar site skin wounds were closed with 4-0 Monocryl and dressed with Dermabond.  Instrument, sponge, and needle counts were correct at the conclusion of the case.   Findings: The appendix was found to be inflamed. There were signs of necrosis.  There was perforation. There was abscess formation.  Estimated Blood Loss:  Minimal              Specimens: appendix to pathology         Complications:  None; patient tolerated the procedure well.         Disposition: PACU - hemodynamically stable.         Condition: stable

## 2017-11-03 NOTE — Interval H&P Note (Signed)
History and Physical Interval Note:  11/03/2017 11:06 AM  Nancy Hurst  has presented today for surgery, with the diagnosis of appendicitis  The various methods of treatment have been discussed with the patient and family. After consideration of risks, benefits and other options for treatment, the patient has consented to  Procedure(s): APPENDECTOMY LAPAROSCOPIC (N/A) as a surgical intervention .  The patient's history has been reviewed, patient examined, no change in status, stable for surgery.  I have reviewed the patient's chart and labs.  Questions were answered to the patient's satisfaction.     Almond Lint

## 2017-11-03 NOTE — ED Provider Notes (Signed)
[facility]-EMERGENCY DEPT Provider Note   CSN: [id number] Arrival date & time: 11/02/17  2006     History   Chief Complaint Chief Complaint  Patient presents with  . Fever  . Diarrhea    HPI Nancy Hurst is a 27 y.o. female.  Patient is a 27 year old female with no significant past medical history.  She presents for evaluation of abdominal pain and loose stools.  She was seen here 2 days ago with similar complaints.  She returns with worsening pain.  She denies any fevers or chills.  She denies any ill contacts.  The history is provided by the patient.  Fever   This is a new problem. The current episode started 2 days ago. The problem has been gradually worsening. Associated symptoms include diarrhea. She has tried nothing for the symptoms.  Diarrhea      History reviewed. No pertinent past medical history.  There are no active problems to display for this patient.   History reviewed. No pertinent surgical history.   OB History   None      Home Medications    Prior to Admission medications   Medication Sig Start Date End Date Taking? Authorizing Provider  doxycycline (VIBRAMYCIN) 100 MG capsule Take 1 capsule (100 mg total) by mouth 2 (two) times daily for 14 days. 10/31/17 11/14/17  Liberty Handy, PA-C  ibuprofen (ADVIL,MOTRIN) 200 MG tablet Take 600 mg by mouth every 6 (six) hours as needed for mild pain.    [provider]  naproxen (NAPROSYN) 500 MG tablet Take 1 tablet (500 mg total) by mouth 2 (two) times daily for 14 days. 10/31/17 11/14/17  Liberty Handy, PA-C  ondansetron (ZOFRAN ODT) 4 MG disintegrating tablet Take 1 tablet (4 mg total) by mouth every 8 (eight) hours as needed for nausea or vomiting. 10/31/17   Liberty Handy, PA-C  terconazole (TERAZOL 3) 0.8 % vaginal cream Place 1 applicator vaginally at bedtime. 04/09/17   Janne Napoleon, NP    Family History History reviewed. No pertinent family  history.  Social History Social History   Tobacco Use  . Smoking status: Never Smoker  . Smokeless tobacco: Never Used  Substance Use Topics  . Alcohol use: No    Frequency: Never  . Drug use: No     Allergies   Patient has no known allergies.   Review of Systems Review of Systems  Constitutional: Positive for fever.  Gastrointestinal: Positive for diarrhea.  All other systems reviewed and are negative.    Physical Exam Updated Vital Signs BP 124/75 (BP Location: Right Arm)   Pulse 92   Temp 98.5 F (36.9 C) (Oral)   Resp 15   Ht 5\' 2"  (1.575 m)   Wt 43.1 kg   LMP 10/31/2017   SpO2 100%   BMI 17.38 kg/m   Physical Exam  Constitutional: She is oriented to person, place, and time. She appears well-developed and well-nourished. No distress.  HENT:  Head: Normocephalic and atraumatic.  Neck: Normal range of motion. Neck supple.  Cardiovascular: Normal rate and regular rhythm. Exam reveals no gallop and no friction rub.  No murmur heard. Pulmonary/Chest: Effort normal and breath sounds normal. No respiratory distress. She has no wheezes.  Abdominal: Soft. Bowel sounds are normal. She exhibits no distension. There is tenderness. There is no rebound and no guarding.  There is tenderness to palpation to the suprapubic region and right lower quadrant.  Musculoskeletal: Normal range  of motion.  Neurological: She is alert and oriented to person, place, and time.  Skin: Skin is warm and dry. She is not diaphoretic.  Nursing note and vitals reviewed.    ED Treatments / Results  Labs (all labs ordered are listed, but only abnormal results are displayed) Labs Reviewed  COMPREHENSIVE METABOLIC PANEL - Abnormal; Notable for the following components:      Result Value   Glucose, Bld 112 (*)    All other components within normal limits  CBC - Abnormal; Notable for the following components:   WBC 23.6 (*)    Hemoglobin 11.6 (*)    HCT 34.2 (*)    Platelets 412 (*)      All other components within normal limits  LIPASE, BLOOD  URINALYSIS, ROUTINE W REFLEX MICROSCOPIC  I-STAT BETA HCG BLOOD, ED ([facility], [facility], [facility] ONLY)    EKG None  Radiology No results found.  Procedures Procedures (including critical care time)  Medications Ordered in ED Medications  ondansetron (ZOFRAN) injection 4 mg (has no administration in time range)  morphine 4 MG/ML injection 4 mg (has no administration in time range)  sodium chloride 0.9 % bolus 1,000 mL (has no administration in time range)     Initial Impression / Assessment and Plan / ED Course  I have reviewed the triage vital signs and the nursing notes.  Pertinent labs & imaging results that were available during my care of the patient were reviewed by me and considered in my medical decision making (see chart for details).  CT scan shows acute appendicitis.  She will be given Rocephin and Flagyl.  I have spoken with Dr. Maisie Fus from general surgery who will evaluate the patient in the ER.  Final Clinical Impressions(s) / ED Diagnoses   Final diagnoses:  None    ED Discharge Orders    None       Geoffery Lyons, MD 11/03/17 [phone]

## 2017-11-03 NOTE — Anesthesia Postprocedure Evaluation (Signed)
Anesthesia Post Note  Patient: Nancy Hurst  Procedure(s) Performed: APPENDECTOMY LAPAROSCOPIC (N/A )     Patient location during evaluation: PACU Anesthesia Type: General Level of consciousness: awake and alert Pain management: pain level controlled Vital Signs Assessment: post-procedure vital signs reviewed and stable Respiratory status: spontaneous breathing, nonlabored ventilation, respiratory function stable and patient connected to nasal cannula oxygen Cardiovascular status: blood pressure returned to baseline and stable Postop Assessment: no apparent nausea or vomiting Anesthetic complications: no    Last Vitals:  Vitals:   11/03/17 1300 11/03/17 1315  BP: 109/66 107/70  Pulse: 92 83  Resp: 17 14  Temp:    SpO2: 100% 100%    Last Pain:  Vitals:   11/03/17 1315  TempSrc:   PainSc: 9                  Reginald Mangels S

## 2017-11-03 NOTE — Progress Notes (Signed)
Used [employer], [place] Post [phone] to complete admission data base

## 2017-11-03 NOTE — Anesthesia Preprocedure Evaluation (Addendum)
Anesthesia Evaluation  Patient identified by MRN, date of birth, ID band Patient awake    Reviewed: Allergy & Precautions, NPO status , Patient's Chart, lab work & pertinent test results  History of Anesthesia Complications Negative for: history of anesthetic complications  Airway Mallampati: II  TM Distance: >3 FB Neck ROM: Full    Dental  (+) Dental Advisory Given, Teeth Intact, Caps   Pulmonary neg pulmonary ROS,    Pulmonary exam normal        Cardiovascular negative cardio ROS Normal cardiovascular exam     Neuro/Psych negative neurological ROS     GI/Hepatic Neg liver ROS,   Endo/Other  negative endocrine ROS  Renal/GU negative Renal ROS     Musculoskeletal negative musculoskeletal ROS (+)   Abdominal   Peds  Hematology negative hematology ROS (+)   Anesthesia Other Findings Day of surgery medications reviewed with the patient.  Reproductive/Obstetrics                            Anesthesia Physical Anesthesia Plan  ASA: II  Anesthesia Plan: General   Post-op Pain Management:    Induction: Intravenous, Rapid sequence and Cricoid pressure planned  PONV Risk Score and Plan: 4 or greater and Ondansetron, Dexamethasone and Scopolamine patch - Pre-op  Airway Management Planned: Oral ETT  Additional Equipment:   Intra-op Plan:   Post-operative Plan: Extubation in OR  Informed Consent: I have reviewed the patients History and Physical, chart, labs and discussed the procedure including the risks, benefits and alternatives for the proposed anesthesia with the patient or authorized representative who has indicated his/her understanding and acceptance.   Dental advisory given  Plan Discussed with: CRNA, Anesthesiologist and Surgeon  Anesthesia Plan Comments: (Interpreter was used for the interview)      Anesthesia Quick Evaluation

## 2017-11-03 NOTE — Anesthesia Procedure Notes (Signed)
Procedure Name: Intubation Date/Time: 11/03/2017 11:29 AM Performed by: Maxwell Caul, CRNA Pre-anesthesia Checklist: Patient identified, Emergency Drugs available, Suction available and Patient being monitored Patient Re-evaluated:Patient Re-evaluated prior to induction Oxygen Delivery Method: Circle system utilized Preoxygenation: Pre-oxygenation with 100% oxygen Induction Type: IV induction, Rapid sequence and Cricoid Pressure applied Laryngoscope Size: Mac and 3 Grade View: Grade I Tube type: Oral Tube size: 7.0 mm Number of attempts: 1 Airway Equipment and Method: Stylet Placement Confirmation: ETT inserted through vocal cords under direct vision,  positive ETCO2 and breath sounds checked- equal and bilateral Secured at: 20 cm Tube secured with: Tape Dental Injury: Teeth and Oropharynx as per pre-operative assessment

## 2017-11-03 NOTE — H&P (Signed)
Nancy Hurst Nancy Hurst is an 27 y.o. female.   Chief Complaint: RLQ pain HPI: 27 year old female who presents to the emergency department with 4 to 5 days of abdominal pain and pelvic pain.  She was seen in the emergency department approximately 3 days ago and diagnosed with pelvic inflammatory disease.  She was sent home on p.o. antibiotics.  Her pain worsened and become localized to the right lower quadrant.  On presentation to the ED now she has a white count of 24.  CT shows signs concerning for appendicitis with possible perforation.  History reviewed. No pertinent past medical history.  History reviewed. No pertinent surgical history.  History reviewed. No pertinent family history. Social History:  reports that she has never smoked. She has never used smokeless tobacco. She reports that she does not drink alcohol or use drugs.  Allergies: No Known Allergies   (Not in a hospital admission)  Results for orders placed or performed during the hospital encounter of 11/03/17 (from the past 48 hour(s))  Lipase, blood     Status: None   Collection Time: 11/02/17  9:18 PM  Result Value Ref Range   Lipase 36 11 - 51 U/L    Comment: Performed at [facility], [place] [address], [place], [place] [address]  Comprehensive metabolic panel     Status: Abnormal   Collection Time: 11/02/17  9:18 PM  Result Value Ref Range   Sodium 138 135 - 145 mmol/L   Potassium 3.5 3.5 - 5.1 mmol/L   Chloride 107 98 - 111 mmol/L   CO2 23 22 - 32 mmol/L   Glucose, Bld 112 (H) 70 - 99 mg/dL   BUN 9 6 - 20 mg/dL   Creatinine, Ser 0.71 0.44 - 1.00 mg/dL   Calcium 9.0 8.9 - 10.3 mg/dL   Total Protein 7.7 6.5 - 8.1 g/dL   Albumin 3.7 3.5 - 5.0 g/dL   AST 16 15 - 41 U/L   ALT 11 0 - 44 U/L   Alkaline Phosphatase 57 38 - 126 U/L   Total Bilirubin 0.7 0.3 - 1.2 mg/dL   GFR calc non Af Amer >60 >60 mL/min   GFR calc Af Amer >60 >60 mL/min    Comment: (NOTE) The eGFR has been calculated  using the CKD EPI equation. This calculation has not been validated in all clinical situations. eGFR's persistently <60 mL/min signify possible Chronic Kidney Disease.    Anion gap 8 5 - 15    Comment: Performed at [facility], [place] [address], [place], [place] [address]  CBC     Status: Abnormal   Collection Time: 11/02/17  9:18 PM  Result Value Ref Range   WBC 23.6 (H) 4.0 - 10.5 K/uL   RBC 3.87 3.87 - 5.11 MIL/uL   Hemoglobin 11.6 (L) 12.0 - 15.0 g/dL   HCT 34.2 (L) 36.0 - 46.0 %   MCV 88.4 78.0 - 100.0 fL   MCH 30.0 26.0 - 34.0 pg   MCHC 33.9 30.0 - 36.0 g/dL   RDW 14.4 11.5 - 15.5 %   Platelets 412 (H) 150 - 400 K/uL    Comment: Performed at [facility], [place] [address] [place], [place] [address]  I-Stat beta hCG blood, ED     Status: None   Collection Time: 11/02/17  9:25 PM  Result Value Ref Range   I-stat hCG, quantitative <5.0 <5 mIU/mL   Comment 3  Comment:   GEST. AGE      CONC.  (mIU/mL)   <=1 WEEK        5 - 50     2 WEEKS       50 - 500     3 WEEKS       100 - 10,000     4 WEEKS     1,000 - 30,000        FEMALE AND NON-PREGNANT FEMALE:     LESS THAN 5 mIU/mL    Ct Abdomen Pelvis W Contrast  Result Date: 11/03/2017 CLINICAL DATA:  27 year old female with abdominal pain. EXAM: CT ABDOMEN AND PELVIS WITH CONTRAST TECHNIQUE: Multidetector CT imaging of the abdomen and pelvis was performed using the standard protocol following bolus administration of intravenous contrast. CONTRAST:  27m OMNIPAQUE IOHEXOL 300 MG/ML  SOLN COMPARISON:  None. FINDINGS: Lower chest: The visualized lung bases are clear. No intra-abdominal free air. There is diffuse inflammatory changes and stranding of the pelvic floor fat and mesentery with probable trace pelvic fluid. Hepatobiliary: The liver is unremarkable. There is mild periportal edema. The gallbladder is unremarkable. Pancreas: Unremarkable. No pancreatic ductal dilatation or surrounding  inflammatory changes. Spleen: Normal in size without focal abnormality. Adrenals/Urinary Tract: The adrenal glands, and kidneys appear unremarkable. The urinary bladder is partially distended. Mild thickened appearance of the bladder wall secondary to inflammatory changes of the pelvis. Stomach/Bowel: There is no bowel obstruction. There is an appendicoliths within the proximal appendix. There is dilatation and inflammatory changes of the appendix distal to the appendicoliths measuring 8 mm in diameter. There is mild enhancement of the appendiceal mucosa. The appendix is located in the right lower quadrant medial to the cecum. There is an area of inflammation or complex fluid with organizing wall adjacent to the appendix measuring approximately 2.2 x 0.7 cm most consistent with phlegmon or developing abscess. No drainable fluid identified at this time. There is thickening of multiple loops of small bowel within the pelvis reactive to appendicitis. Vascular/Lymphatic: The abdominal aorta and IVC are grossly unremarkable. No portal venous gas. There is no adenopathy. Reproductive: The uterus is retroverted. The ovaries are grossly unremarkable. Other: None Musculoskeletal: No acute or significant osseous findings. IMPRESSION: Acute appendicitis with small adjacent phlegmon or developing abscess. No drainable fluid collection at this time. Electronically Signed   By: AAnner CreteM.D.   On: 11/03/2017 05:38    Review of Systems  Constitutional: Negative for chills and fever.  HENT: Negative for hearing loss.   Eyes: Negative for blurred vision.  Respiratory: Negative for cough and shortness of breath.   Cardiovascular: Negative for chest pain and palpitations.  Gastrointestinal: Positive for abdominal pain. Negative for nausea and vomiting.  Genitourinary: Negative for dysuria and urgency.  Skin: Negative for itching and rash.  Neurological: Negative for dizziness.    Blood pressure 97/68, pulse 89,  temperature 98.5 F (36.9 C), temperature source Oral, resp. rate 15, height _0  (1.575 m), weight 43.1 kg, last menstrual period 10/31/2017, SpO2 100 %. Physical Exam  Constitutional: She is oriented to person, place, and time. She appears well-developed and well-nourished.  HENT:  Head: Normocephalic and atraumatic.  Eyes: Pupils are equal, round, and reactive to light. Conjunctivae and EOM are normal.  Neck: Normal range of motion. Neck supple.  Cardiovascular: Normal rate and regular rhythm.  Respiratory: Effort normal and breath sounds normal. No respiratory distress.  GI: Soft. There is tenderness (RLQ).  Musculoskeletal: Normal range of motion.  Neurological: She is alert and oriented to person, place, and time.  Skin: Skin is warm and dry.     Assessment/Plan 27 year old female who appears to have appendicitis with possible perforation.  Given her elevated white count and prolonged symptoms, I think it is reasonable to proceed to the OR for laparoscopic appendectomy.  I have discussed this with Dr. Barry Dienes, the operating surgeon, and she agrees.  We will plan on this procedure later today.  Rosario Adie., MD 1/36/4383, 7:10 AM

## 2017-11-03 NOTE — Transfer of Care (Signed)
Immediate Anesthesia Transfer of Care Note  Patient: Nancy Hurst  Procedure(s) Performed: APPENDECTOMY LAPAROSCOPIC (N/A )  Patient Location: PACU  Anesthesia Type:General  Level of Consciousness: awake, alert  and oriented  Airway & Oxygen Therapy: Patient Spontanous Breathing and Patient connected to face mask oxygen  Post-op Assessment: Report given to RN and Post -op Vital signs reviewed and stable  Post vital signs: Reviewed and stable  Last Vitals:  Vitals Value Taken Time  BP 109/70 11/03/2017 12:54 PM  Temp    Pulse 104 11/03/2017 12:56 PM  Resp 17 11/03/2017 12:56 PM  SpO2 100 % 11/03/2017 12:56 PM  Vitals shown include unvalidated device data.  Last Pain:  Vitals:   11/03/17 0952  TempSrc: Oral  PainSc:       Patients Stated Pain Goal: 2 (11/02/17 2103)  Complications: No apparent anesthesia complications

## 2017-11-04 ENCOUNTER — Encounter (HOSPITAL_COMMUNITY): Payer: Self-pay | Admitting: General Surgery

## 2017-11-04 LAB — URINALYSIS, ROUTINE W REFLEX MICROSCOPIC
Bilirubin Urine: NEGATIVE
Glucose, UA: NEGATIVE mg/dL
Ketones, ur: NEGATIVE mg/dL
Nitrite: NEGATIVE
PROTEIN: NEGATIVE mg/dL
Specific Gravity, Urine: 1.023 (ref 1.005–1.030)
pH: 6 (ref 5.0–8.0)

## 2017-11-04 LAB — BASIC METABOLIC PANEL
ANION GAP: 8 (ref 5–15)
BUN: 8 mg/dL (ref 6–20)
CHLORIDE: 107 mmol/L (ref 98–111)
CO2: 23 mmol/L (ref 22–32)
CREATININE: 0.49 mg/dL (ref 0.44–1.00)
Calcium: 8.2 mg/dL — ABNORMAL LOW (ref 8.9–10.3)
GFR calc non Af Amer: >60 mL/min (ref >60)
Glucose, Bld: 157 mg/dL — ABNORMAL HIGH (ref 70–99)
Potassium: 3.8 mmol/L (ref 3.5–5.1)
SODIUM: 138 mmol/L (ref 135–145)

## 2017-11-04 LAB — CBC
HEMATOCRIT: 29.9 % — AB (ref 36.0–46.0)
HEMOGLOBIN: 10.2 g/dL — AB (ref 12.0–15.0)
MCH: 30.1 pg (ref 26.0–34.0)
MCHC: 34.1 g/dL (ref 30.0–36.0)
MCV: 88.2 fL (ref 78.0–100.0)
Platelets: 406 10*3/uL — ABNORMAL HIGH (ref 150–400)
RBC: 3.39 MIL/uL — AB (ref 3.87–5.11)
RDW: 14.7 % (ref 11.5–15.5)
WBC: 12.4 10*3/uL — AB (ref 4.0–10.5)

## 2017-11-04 MED ORDER — SODIUM CHLORIDE 0.9 % IV SOLN
INTRAVENOUS | Status: DC | PRN
  Administered 2017-11-04: 250 mL via INTRAVENOUS

## 2017-11-04 MED ORDER — HYDROMORPHONE HCL 1 MG/ML IJ SOLN
0.5000 mg | INTRAMUSCULAR | Status: DC | PRN
  Administered 2017-11-04 – 2017-11-05 (×3): 1 mg via INTRAVENOUS
  Filled 2017-11-04 (×3): qty 1

## 2017-11-04 MED ORDER — ACETAMINOPHEN 500 MG PO TABS
1000.0000 mg | ORAL_TABLET | Freq: Three times a day (TID) | ORAL | Status: DC
  Administered 2017-11-04 – 2017-11-05 (×4): 1000 mg via ORAL
  Filled 2017-11-04 (×5): qty 2

## 2017-11-04 NOTE — Progress Notes (Signed)
1 Day Post-Op    CC: Abdominal pain  Subjective: [employer] interpreter [phone] was used to do the translating.  Patient is feeling better but still sore and tender after her surgery.  She is only had water to drink.  No flatus so far.  Her abdomen actually looks pretty good not overly distended or tender.  Says it burns when she voids.  Objective: Vital signs in last 24 hours: Temp:  [97.4 F (36.3 C)-98.4 F (36.9 C)] 97.8 F (36.6 C) (09/27 1019) Pulse Rate:  [54-103] 54 (09/27 1019) Resp:  [10-18] 18 (09/27 1019) BP: (96-114)/(61-78) 111/73 (09/27 1019) SpO2:  [100 %] 100 % (09/27 1019) Last BM Date: 11/02/17  Intake/Output from previous day: 09/26 0701 - 09/27 0700 In: 3568.2 [P.O.:120; I.V.:3135.8; IV Piggyback:312.4] Out: 860 [Urine:850; Blood:10] Intake/Output this shift: No intake/output data recorded.  General appearance: alert, cooperative and no distress Resp: clear to auscultation bilaterally GI: Soft, tender, minimal if any distention.  Hypoactive no flatus so far.  Lab Results:  Recent Labs    11/03/17 1353 11/04/17 0335  WBC 18.7* 12.4*  HGB 10.7* 10.2*  HCT 31.5* 29.9*  PLT 415* 406*    BMET Recent Labs    11/02/17 2118 11/03/17 1353 11/04/17 0335  NA 138  --  138  K 3.5  --  3.8  CL 107  --  107  CO2 23  --  23  GLUCOSE 112*  --  157*  BUN 9  --  8  CREATININE 0.71 0.60 0.49  CALCIUM 9.0  --  8.2*   PT/INR No results for input(s): LABPROT, INR in the last 72 hours.  Recent Labs  Lab 10/31/17 1547 11/02/17 2118  AST 18 16  ALT 11 11  ALKPHOS 39 57  BILITOT 0.8 0.7  PROT 8.0 7.7  ALBUMIN 4.0 3.7     Lipase     Component Value Date/Time   LIPASE 36 11/02/2017 2118     Prior to Admission medications   Medication Sig Start Date End Date Taking? Authorizing Provider  acetaminophen (TYLENOL) 500 MG tablet Take 1,000 mg by mouth every 6 (six) hours as needed for mild pain.   Yes [provider]  ibuprofen (ADVIL,MOTRIN)  200 MG tablet Take 600 mg by mouth every 6 (six) hours as needed for mild pain.   Yes [provider]  naproxen (NAPROSYN) 500 MG tablet Take 1 tablet (500 mg total) by mouth 2 (two) times daily for 14 days. 10/31/17 11/14/17 Yes Liberty Handy, PA-C  doxycycline (VIBRAMYCIN) 100 MG capsule Take 1 capsule (100 mg total) by mouth 2 (two) times daily for 14 days. 10/31/17 11/14/17  Liberty Handy, PA-C  ondansetron (ZOFRAN ODT) 4 MG disintegrating tablet Take 1 tablet (4 mg total) by mouth every 8 (eight) hours as needed for nausea or vomiting. 10/31/17   Liberty Handy, PA-C  terconazole (TERAZOL 3) 0.8 % vaginal cream Place 1 applicator vaginally at bedtime. Patient not taking: Reported on 11/03/2017 04/09/17   Janne Napoleon, NP    Medications: . doxycycline  100 mg Oral Q12H  . enoxaparin (LOVENOX) injection  40 mg Subcutaneous Q24H  . gabapentin  300 mg Oral BID  . ketorolac  30 mg Intravenous Q6H  . senna  1 tablet Oral BID   . sodium chloride 250 mL (11/04/17 0639)  . cefTRIAXone (ROCEPHIN)  IV 2 g (11/04/17 0645)   And  . metronidazole 500 mg (11/04/17 0454)  . dextrose 5%  lactated ringers Stopped (11/04/17 0454)  . lactated ringers 50 mL/hr at 11/03/17 0955   Anti-infectives (From admission, onward)   Start     Dose/Rate Route Frequency Ordered Stop   11/04/17 0600  cefTRIAXone (ROCEPHIN) 2 g in sodium chloride 0.9 % 100 mL IVPB     2 g 200 mL/hr over 30 Minutes Intravenous Every 24 hours 11/03/17 1631     11/03/17 2200  doxycycline (VIBRAMYCIN) capsule 100 mg  Status:  Discontinued     100 mg Oral 2 times daily 11/03/17 1631 11/03/17 1637   11/03/17 2200  doxycycline (VIBRA-TABS) tablet 100 mg     100 mg Oral Every 12 hours 11/03/17 1639     11/03/17 2000  metroNIDAZOLE (FLAGYL) IVPB 500 mg     500 mg 100 mL/hr over 60 Minutes Intravenous Every 8 hours 11/03/17 1631     11/03/17 1117  metroNIDAZOLE (FLAGYL) 5-0.79 MG/ML-% IVPB    Note to Pharmacy:  Maricela Curet    : cabinet override      11/03/17 1117 11/03/17 1205   11/03/17 0600  cefTRIAXone (ROCEPHIN) 1 g in sodium chloride 0.9 % 100 mL IVPB     1 g 200 mL/hr over 30 Minutes Intravenous  Once 11/03/17 0552 11/03/17 0714   11/03/17 0600  metroNIDAZOLE (FLAGYL) IVPB 500 mg     500 mg 100 mL/hr over 60 Minutes Intravenous  Once 11/03/17 0552 11/03/17 1235     Assessment/Plan  Acute perforated appendicitis with peritoneal abscess Dr. Almond Lint PID dx 10/31/17 =>> doxycycline Dysuria - new UA/urine culture -GC/chlamydia negative on 10/31/2017  FEN:  IV fluids/clears ID:  Doxycycline pre op for possible PID 10/31/17 - I am going to stop for now;          Rocephin/Flagyl 9/26 =>> day 2 DVT:  Lovenox Follow up:  [facility]  Plan: Continue antibiotics advance her diet to clears and then full's as tolerated.  Mobilize.  I will recheck her urine.  I told her she can go home when she was taking p.o.'s well, bowel function returned and voiding without difficulty.  She has follow-up information and follow-up appointment with the [facility] in her AVS.  She does not work.  Hopefully she will be ready to go home in the next 24 to 48 hours.   LOS: 1 day    Antionette Luster 11/04/2017 [phone]

## 2017-11-05 LAB — CBC
HEMATOCRIT: 29.4 % — AB (ref 36.0–46.0)
HEMOGLOBIN: 9.9 g/dL — AB (ref 12.0–15.0)
MCH: 29.6 pg (ref 26.0–34.0)
MCHC: 33.7 g/dL (ref 30.0–36.0)
MCV: 88 fL (ref 78.0–100.0)
Platelets: 421 10*3/uL — ABNORMAL HIGH (ref 150–400)
RBC: 3.34 MIL/uL — ABNORMAL LOW (ref 3.87–5.11)
RDW: 14.6 % (ref 11.5–15.5)
WBC: 11.9 10*3/uL — ABNORMAL HIGH (ref 4.0–10.5)

## 2017-11-05 LAB — BASIC METABOLIC PANEL
Anion gap: 7 (ref 5–15)
BUN: 11 mg/dL (ref 6–20)
CHLORIDE: 109 mmol/L (ref 98–111)
CO2: 23 mmol/L (ref 22–32)
CREATININE: 0.66 mg/dL (ref 0.44–1.00)
Calcium: 8.4 mg/dL — ABNORMAL LOW (ref 8.9–10.3)
GFR calc Af Amer: >60 mL/min (ref >60)
GFR calc non Af Amer: >60 mL/min (ref >60)
Glucose, Bld: 114 mg/dL — ABNORMAL HIGH (ref 70–99)
Potassium: 3.2 mmol/L — ABNORMAL LOW (ref 3.5–5.1)
SODIUM: 139 mmol/L (ref 135–145)

## 2017-11-05 LAB — URINE CULTURE: CULTURE: NO GROWTH

## 2017-11-05 NOTE — Progress Notes (Signed)
Patient ID: Nancy Hurst, female   DOB: 09-05-1990, 27 y.o.   MRN: [id number]   Acute Care Surgery Service Progress Note:    Chief Complaint/Subjective: Via video interpreter -  No n/v Tolerating liquids. More burping than flatus. But does report a BM  Objective: Vital signs in last 24 hours: Temp:  [97.5 F (36.4 C)-98.2 F (36.8 C)] 97.5 F (36.4 C) (09/28 0536) Pulse Rate:  [53-72] 72 (09/28 0536) Resp:  [18] 18 (09/28 0536) BP: (110-118)/(71-76) 118/76 (09/28 0536) SpO2:  [95 %-100 %] 95 % (09/28 0536) Last BM Date: 11/02/17  Intake/Output from previous day: 09/27 0701 - 09/28 0700 In: 4196.4 [P.O.:480; I.V.:2487.7; IV Piggyback:1228.6] Out: 350 [Urine:350] Intake/Output this shift: Total I/O In: 240 [P.O.:240] Out: 0   Lungs: cta, nonlabored  Cardiovascular: reg  Abd: soft, nd, incisions ok. Mild approp TTP  Extremities: no edema, +SCDs  Neuro: alert, nonfocal  Lab Results: CBC  Recent Labs    11/04/17 0335 11/05/17 0324  WBC 12.4* 11.9*  HGB 10.2* 9.9*  HCT 29.9* 29.4*  PLT 406* 421*   BMET Recent Labs    11/04/17 0335 11/05/17 0324  NA 138 139  K 3.8 3.2*  CL 107 109  CO2 23 23  GLUCOSE 157* 114*  BUN 8 11  CREATININE 0.49 0.66  CALCIUM 8.2* 8.4*   LFT Hepatic Function Latest Ref Rng & Units 11/02/2017 10/31/2017  Total Protein 6.5 - 8.1 g/dL 7.7 8.0  Albumin 3.5 - 5.0 g/dL 3.7 4.0  AST 15 - 41 U/L 16 18  ALT 0 - 44 U/L 11 11  Alk Phosphatase 38 - 126 U/L 57 39  Total Bilirubin 0.3 - 1.2 mg/dL 0.7 0.8   PT/INR No results for input(s): LABPROT, INR in the last 72 hours. ABG No results for input(s): PHART, HCO3 in the last 72 hours.  Invalid input(s): PCO2, PO2  Studies/Results:  Anti-infectives: Anti-infectives (From admission, onward)   Start     Dose/Rate Route Frequency Ordered Stop   11/04/17 0600  cefTRIAXone (ROCEPHIN) 2 g in sodium chloride 0.9 % 100 mL IVPB     2 g 200 mL/hr over 30 Minutes Intravenous  Every 24 hours 11/03/17 1631     11/03/17 2200  doxycycline (VIBRAMYCIN) capsule 100 mg  Status:  Discontinued     100 mg Oral 2 times daily 11/03/17 1631 11/03/17 1637   11/03/17 2200  doxycycline (VIBRA-TABS) tablet 100 mg  Status:  Discontinued     100 mg Oral Every 12 hours 11/03/17 1639 11/04/17 1121   11/03/17 2000  metroNIDAZOLE (FLAGYL) IVPB 500 mg     500 mg 100 mL/hr over 60 Minutes Intravenous Every 8 hours 11/03/17 1631     11/03/17 1117  metroNIDAZOLE (FLAGYL) 5-0.79 MG/ML-% IVPB    Note to Pharmacy:  Virgia Hurst   : cabinet override      11/03/17 1117 11/03/17 1205   11/03/17 0600  cefTRIAXone (ROCEPHIN) 1 g in sodium chloride 0.9 % 100 mL IVPB     1 g 200 mL/hr over 30 Minutes Intravenous  Once 11/03/17 0552 11/03/17 0714   11/03/17 0600  metroNIDAZOLE (FLAGYL) IVPB 500 mg     500 mg 100 mL/hr over 60 Minutes Intravenous  Once 11/03/17 0552 11/03/17 1235      Medications: Scheduled Meds: . acetaminophen  1,000 mg Oral Q8H  . enoxaparin (LOVENOX) injection  40 mg Subcutaneous Q24H  . gabapentin  300 mg Oral BID  . senna  1 tablet Oral BID   Continuous Infusions: . sodium chloride 250 mL (11/04/17 0639)  . cefTRIAXone (ROCEPHIN)  IV 200 mL/hr at 11/05/17 0639   And  . metronidazole Stopped (11/05/17 0449)  . dextrose 5% lactated ringers 75 mL/hr at 11/04/17 2347  . lactated ringers 50 mL/hr at 11/03/17 0955   PRN Meds:.sodium chloride, diphenhydrAMINE **OR** diphenhydrAMINE, HYDROmorphone (DILAUDID) injection, ibuprofen, methocarbamol, ondansetron, oxyCODONE, prochlorperazine **OR** prochlorperazine, simethicone  Assessment/Plan: Patient Active Problem List   Diagnosis Date Noted  . Perforated appendicitis 11/03/2017   s/p Procedure(s): Acute perforated appendicitis with peritoneal abscess Dr. Stark Hurst PID dx 10/31/17 =>> doxycycline Dysuria - new UA/urine culture -GC/chlamydia negative on 10/31/2017  FEN:  IV fluids/clears ID:  Doxycycline pre op  for possible PID 10/31/17 - I am going to stop for now;          Rocephin/Flagyl 9/26 =>> day 2 DVT:  Lovenox Follow up:  [facility]  Plan: Continue antibiotics advance her diet to fulls and then soft diet for lunch as tolerated.  Mobilize. e.  If does well with breakfast and lunch can go home later today.  Discussed dc instructions with pt and husband via video interpreter    She has follow-up information and follow-up appointment with the [facility] in her AVS.  She does not work.   Disposition:  LOS: 2 days    Nancy Ruff. Redmond Pulling, MD, FACS General, Bariatric, & Minimally Invasive Surgery [phone] [facility]

## 2017-11-05 NOTE — Progress Notes (Signed)
Patient IV leaking and had to be d/c. Tolerating meal trays with no nausea or vomiting. When given pills to take she had nausea and emesis with those. Emesis was clear; small amount of water that she drank to take pills. Ambulating in hall with no complaints of pain or any additional issues noted. Will continue to monitor.

## 2017-11-05 NOTE — Plan of Care (Signed)
Pt resting in bed this morning. No complaints of pain voiced at this time. Family in room with patient. Will continue to monitor.

## 2017-11-06 LAB — CBC WITH DIFFERENTIAL/PLATELET
BASOS PCT: 1 %
Basophils Absolute: 0 10*3/uL (ref 0.0–0.1)
Basophils Absolute: 0.1 10*3/uL (ref 0.0–0.1)
Basophils Relative: 0 %
EOS PCT: 0 %
EOS PCT: 1 %
Eosinophils Absolute: 0 10*3/uL (ref 0.0–0.7)
Eosinophils Absolute: 0.1 10*3/uL (ref 0.0–0.7)
HCT: 35 % — ABNORMAL LOW (ref 36.0–46.0)
HEMATOCRIT: 31.3 % — AB (ref 36.0–46.0)
Hemoglobin: 12.1 g/dL (ref 12.0–15.0)
Hemoglobin: 10.8 g/dL — ABNORMAL LOW (ref 12.0–15.0)
LYMPHS ABS: 2.1 10*3/uL (ref 0.7–4.0)
LYMPHS PCT: 11 %
Lymphocytes Relative: 19 %
Lymphs Abs: 1.5 10*3/uL (ref 0.7–4.0)
MCH: 30.3 pg (ref 26.0–34.0)
MCH: 30.1 pg (ref 26.0–34.0)
MCHC: 34.6 g/dL (ref 30.0–36.0)
MCHC: 34.5 g/dL (ref 30.0–36.0)
MCV: 87.7 fL (ref 78.0–100.0)
MCV: 87.2 fL (ref 78.0–100.0)
MONO ABS: 0.8 10*3/uL (ref 0.1–1.0)
MONOS PCT: 9 %
Monocytes Absolute: 1.2 10*3/uL — ABNORMAL HIGH (ref 0.1–1.0)
Monocytes Relative: 7 %
NEUTROS PCT: 78 %
Neutro Abs: 8.3 10*3/uL — ABNORMAL HIGH (ref 1.7–7.7)
Neutro Abs: 10.3 10*3/uL — ABNORMAL HIGH (ref 1.7–7.7)
Neutrophils Relative %: 74 %
PLATELETS: 516 10*3/uL — AB (ref 150–400)
Platelets: 499 10*3/uL — ABNORMAL HIGH (ref 150–400)
RBC: 3.99 MIL/uL (ref 3.87–5.11)
RBC: 3.59 MIL/uL — AB (ref 3.87–5.11)
RDW: 14.3 % (ref 11.5–15.5)
RDW: 14.4 % (ref 11.5–15.5)
WBC: 11.2 10*3/uL — ABNORMAL HIGH (ref 4.0–10.5)
WBC: 13.2 10*3/uL — ABNORMAL HIGH (ref 4.0–10.5)

## 2017-11-06 MED ORDER — LACTATED RINGERS IV BOLUS
1000.0000 mL | Freq: Once | INTRAVENOUS | Status: AC
  Administered 2017-11-06: 1000 mL via INTRAVENOUS

## 2017-11-06 MED ORDER — OXYCODONE HCL 5 MG PO TABS: 5.0000 mg | ORAL_TABLET | ORAL | 0 refills | Status: DC | PRN

## 2017-11-06 MED ORDER — IBUPROFEN 600 MG PO TABS: 600.0000 mg | ORAL_TABLET | Freq: Four times a day (QID) | ORAL | 0 refills | Status: DC | PRN

## 2017-11-06 NOTE — Progress Notes (Signed)
Patient refusing all PO medication. Patient is eating/drinking without vomiting. Patient states "medication will make me throw up". Declines medication even after antiemetic given. Will continue to monitor.

## 2017-11-06 NOTE — Progress Notes (Signed)
Patient ID: Nancy Hurst, female   DOB: 08/05/1990, 27 y.o.   MRN: [id number]   Acute Care Surgery Service Progress Note:    Chief Complaint/Subjective: Via video interpreter -  Had bloody bm last night, but has since had normal BM.  No n/v.  Tolerating diet.  Passing flatus.    Objective: Vital signs in last 24 hours: Temp:  [97.9 F (36.6 C)-98.6 F (37 C)] 98.6 F (37 C) (09/29 0519) Pulse Rate:  [59-61] 61 (09/29 0519) Resp:  [16-18] 16 (09/29 0519) BP: (84-138)/(63-82) 84/63 (09/29 0519) SpO2:  [97 %-100 %] 100 % (09/29 0519) Last BM Date: 11/05/17  Intake/Output from previous day: 09/28 0701 - 09/29 0700 In: 3211.3 [P.O.:780; I.V.:2051.3; IV Piggyback:380.1] Out: 550 [Urine:550] Intake/Output this shift: No intake/output data recorded. Gen:  A&o x 3  Lungs:  nonlabored  Cardiovascular: reg  Abd: soft, nd, incisions ok. Mild approp TTP  Extremities: no edema, +SCDs  Neuro: alert, nonfocal  Lab Results: CBC  Recent Labs    11/05/17 2144 11/06/17 0646  WBC 11.2* 13.2*  HGB 12.1 10.8*  HCT 35.0* 31.3*  PLT 516* 499*   BMET Recent Labs    11/04/17 0335 11/05/17 0324  NA 138 139  K 3.8 3.2*  CL 107 109  CO2 23 23  GLUCOSE 157* 114*  BUN 8 11  CREATININE 0.49 0.66  CALCIUM 8.2* 8.4*   LFT Hepatic Function Latest Ref Rng & Units 11/02/2017 10/31/2017  Total Protein 6.5 - 8.1 g/dL 7.7 8.0  Albumin 3.5 - 5.0 g/dL 3.7 4.0  AST 15 - 41 U/L 16 18  ALT 0 - 44 U/L 11 11  Alk Phosphatase 38 - 126 U/L 57 39  Total Bilirubin 0.3 - 1.2 mg/dL 0.7 0.8   PT/INR No results for input(s): LABPROT, INR in the last 72 hours. ABG No results for input(s): PHART, HCO3 in the last 72 hours.  Invalid input(s): PCO2, PO2  Studies/Results:  Anti-infectives: Anti-infectives (From admission, onward)   Start     Dose/Rate Route Frequency Ordered Stop   11/04/17 0600  cefTRIAXone (ROCEPHIN) 2 g in sodium chloride 0.9 % 100 mL IVPB     2 g 200 mL/hr  over 30 Minutes Intravenous Every 24 hours 11/03/17 1631     11/03/17 2200  doxycycline (VIBRAMYCIN) capsule 100 mg  Status:  Discontinued     100 mg Oral 2 times daily 11/03/17 1631 11/03/17 1637   11/03/17 2200  doxycycline (VIBRA-TABS) tablet 100 mg  Status:  Discontinued     100 mg Oral Every 12 hours 11/03/17 1639 11/04/17 1121   11/03/17 2000  metroNIDAZOLE (FLAGYL) IVPB 500 mg     500 mg 100 mL/hr over 60 Minutes Intravenous Every 8 hours 11/03/17 1631     11/03/17 1117  metroNIDAZOLE (FLAGYL) 5-0.79 MG/ML-% IVPB    Note to Pharmacy:  [name]   : cabinet override      11/03/17 1117 11/03/17 1205   11/03/17 0600  cefTRIAXone (ROCEPHIN) 1 g in sodium chloride 0.9 % 100 mL IVPB     1 g 200 mL/hr over 30 Minutes Intravenous  Once 11/03/17 0552 11/03/17 0714   11/03/17 0600  metroNIDAZOLE (FLAGYL) IVPB 500 mg     500 mg 100 mL/hr over 60 Minutes Intravenous  Once 11/03/17 0552 11/03/17 1235      Medications: Scheduled Meds: . acetaminophen  1,000 mg Oral Q8H  . enoxaparin (LOVENOX) injection  40 mg Subcutaneous Q24H  .  gabapentin  300 mg Oral BID  . senna  1 tablet Oral BID   Continuous Infusions: . sodium chloride 250 mL (11/04/17 0639)  . cefTRIAXone (ROCEPHIN)  IV 2 g (11/06/17 0524)   And  . metronidazole 500 mg (11/06/17 0323)  . dextrose 5% lactated ringers 75 mL/hr at 11/04/17 2347  . lactated ringers 50 mL/hr at 11/03/17 0955   PRN Meds:.sodium chloride, diphenhydrAMINE **OR** diphenhydrAMINE, HYDROmorphone (DILAUDID) injection, ibuprofen, methocarbamol, ondansetron, oxyCODONE, prochlorperazine **OR** prochlorperazine, simethicone  Assessment/Plan: Patient Active Problem List   Diagnosis Date Noted  . Perforated appendicitis 11/03/2017   s/p Procedure(s): Acute perforated appendicitis with peritoneal abscess Dr. Stark Klein PID dx 10/31/17 =>> doxycycline Dysuria - new UA/urine culture -GC/chlamydia negative on 10/31/2017  FEN:  IV fluids/clears ID:   Doxycycline pre op for possible PID 10/31/17 -          Rocephin/Flagyl 9/26 =>> day 3 DVT:  Lovenox Follow up:  [facility]  Plan: Home today.     LOS: 3 days

## 2017-11-06 NOTE — Progress Notes (Signed)
Patient with episode of frank red blood with bowel movement. Abdomen is firm and distended. Rectal exam shows no obvious signs of trauma or hemorrhoids. Patient denies straining or history of hemorrhoids. Vital signs stable, patient reports no pain or dizziness. MD notified. CBC ordered and instructed to hold morning lovenox. Will continue to monitor.

## 2017-11-06 NOTE — Progress Notes (Signed)
Assessment unchanged. Pt and husband verbalized understanding of dc instructions given by Dr. Donell Beers and primary care nurse using [employer] Interpreter, Lanora Manis [phone] including follow up care and when to call the doctor. No scripts at dc. Md called meds to personal pharmacy and pt aware. Discharged via wc to front entrance accompanied by NT, husband and family.

## 2017-11-06 NOTE — Discharge Instructions (Signed)
Apendicectoma laparoscpica en adultos, cuidados posteriores Laparoscopic Appendectomy, Adult, Care After Siga estas instrucciones durante las prximas semanas. Estas indicaciones le proporcionan informacin acerca de cmo deber cuidarse despus del procedimiento. Su mdico tambin podr darle instrucciones ms especficas. El tratamiento ha sido planificado segn las prcticas mdicas actuales, pero en algunos casos pueden ocurrir problemas. Comunquese con el mdico si tiene algn problema o dudas despus del procedimiento. Qu puedo esperar despus del procedimiento? Despus del procedimiento, es comn DIRECTV siguientes sntomas:  Disminucin en el nivel de Martha.  Dolor leve en la zona donde se realizaron los cortes quirrgicos (incisiones).  Estreimiento. Puede ser a causa de los analgsicos y la disminucin de sus [place].  Siga estas instrucciones en su casa: Medicamentos  [employer] de venta libre y los recetados solamente como se lo haya indicado el mdico.  No conduzca durante 24horas si le administraron un sedante.  No conduzca ni opere maquinaria pesada mientras toma analgsicos recetados.  Si le recetaron un antibitico, tmelo como se lo haya indicado el mdico. No deje de tomar los antibiticos aunque comience a Actor. Actividad  Siga las siguientes indicaciones durante 3 semanas o el tiempo que le haya indicado el mdico: ? No levante ningn objeto que pese ms de 10libras (4,5 kg). ? No practique deportes de contacto.  Retome gradualmente sus actividades normales. Pregntele al mdico qu actividades son seguras para usted. El bao  Mantenga las incisiones limpias y secas. Lmpielas como se lo haya indicado el mdico: ? Lave las incisiones suavemente con agua y [place]. ? Enjuguelas con agua para quitar todo el jabn. ? Squelas bien con una toalla limpia dando golpecitos. No frote sobre las incisiones.  Puede tomar una ducha  despus de 48horas.  No tome baos de inmersin, no practique natacin ni use el jacuzzi durante 2semanas o segn las indicaciones del mdico. Cuidados de la herida  Siga las indicaciones del mdico acerca del cuidado de las incisiones. Haga lo siguiente: ? Lvese las manos con agua y jabn antes de Multimedia programmer las vendas (vendaje). Use desinfectante para manos si no dispone de [place] y [place]. ? Cambie el vendaje como se lo haya indicado el mdico. ? No retire los puntos (suturas), el [employer]COMM para la piel o las tiras [place]. Es posible que estos deban quedar puestos en la piel durante 2semanas o ms tiempo. Si los bordes de las tiras [address] empiezan a despegarse y Scientific laboratory technician, puede recortar los que estn sueltos. No retire las tiras [employer] por completo a menos que el mdico se lo indique.  Controle todos los [employer] zonas de las incisiones para detectar signos de infeccin. Est atento a los siguientes signos: ? Aumento del enrojecimiento, de la hinchazn o del dolor. ? Ms lquido [place]. ? Calor. ? Pus o mal olor. Otras indicaciones  Si lo enviaron de regreso a su casa con un drenaje, siga las instrucciones del mdico sobre cmo cuidarlo.  Haga respiraciones profundas. Esto ayuda a prevenir que se inflamen los pulmones.  Lyda Perone y prevenir el estreimiento: ? Beba abundantes lquidos. ? Coma muchas frutas y verduras frescas.  Concurra a todas las visitas de control como se lo haya indicado el mdico. Esto es importante. Comunquese con un mdico si:  Aumentan el enrojecimiento, la hinchazn o el dolor alrededor de una incisin.  Le sale ms lquido o sangre de [place] incisin.  La incisin est caliente al tacto.  Tiene pus o un olor ftido que provienen de [place] incisin  o de un vendaje.  Se le abren los bordes de la incisin despus de que le hayan sacado las suturas.  Siente ms dolor en los hombros.  Se siente mareado o se desmaya.  Le falta el aire.  Sigue  teniendo nuseas o vomitando.  Tiene diarrea o no pierde el control de las funciones intestinales.  Pierde el apetito.  Presenta dolor o hinchazn en las piernas. Solicite ayuda de inmediato si:  Tiene fiebre.  Le aparece una erupcin cutnea.  Tiene dificultad para respirar.  Siente dolores fuertes [employer]aya. Esta informacin no tiene Theme park manager el consejo del mdico. Asegrese de hacerle al mdico cualquier pregunta que tenga. Document Released: 02/14/2007 Document Revised: 05/03/2016 Document Reviewed: 07/15/2014 Elsevier Interactive Patient Education  2018 [employer].  CCS ______[facility] LAPAROSCOPIC SURGERY: POST OP INSTRUCTIONS Always review your discharge instruction sheet given to you by the facility where your surgery was performed. IF YOU HAVE DISABILITY OR FAMILY LEAVE FORMS, YOU MUST BRING THEM TO THE OFFICE FOR PROCESSING.   DO NOT GIVE THEM TO YOUR DOCTOR.  1. A prescription for pain medication may be given to you upon discharge.  Take your pain medication as prescribed, if needed.  If narcotic pain medicine is not needed, then you may take acetaminophen (Tylenol) or ibuprofen (Advil) as needed. 2. Take your usually prescribed medications unless otherwise directed. 3. If you need a refill on your pain medication, please contact your pharmacy.  They will contact our office to request authorization. Prescriptions will not be filled after 5pm or on week-ends. 4. You should follow a light diet the first few days after arrival home, such as soup and crackers, etc.  Be sure to include lots of fluids daily. 5. Most patients will experience some swelling and bruising in the area of the incisions.  Ice packs will help.  Swelling and bruising can take several days to resolve.  6. It is common to experience some constipation if taking pain medication after surgery.  Increasing fluid intake and taking a stool softener (such as Colace) will usually  help or prevent this problem from occurring.  A mild laxative (Milk of Magnesia or Miralax) should be taken according to package instructions if there are no bowel movements after 48 hours. 7. Unless discharge instructions indicate otherwise, you may remove your bandages 24-48 hours after surgery, and you may shower at that time.  You may have steri-strips (small skin tapes) in place directly over the incision.  These strips should be left on the skin for 7-10 days.  If your surgeon used skin glue on the incision, you may shower in 24 hours.  The glue will flake off over the next 2-3 weeks.  Any sutures or staples will be removed at the office during your follow-up visit. 8. ACTIVITIES:  You may resume regular (light) daily activities beginning the next day--such as daily self-care, walking, climbing stairs--gradually increasing activities as tolerated.  You may have sexual intercourse when it is comfortable.  Refrain from any heavy lifting or straining until approved by your doctor. a. You may drive when you are no longer taking prescription pain medication, you can comfortably wear a seatbelt, and you can safely maneuver your car and apply brakes. b. RETURN TO WORK:  __________________________________________________________ 9. You should see your doctor in the office for a follow-up appointment approximately 2-3 weeks after your surgery.  Make sure that you call for this appointment within a day or two after you  arrive home to insure a convenient appointment time. 10. OTHER INSTRUCTIONS: __________________________________________________________________________________________________________________________ __________________________________________________________________________________________________________________________ WHEN TO CALL YOUR DOCTOR: 1. Fever over 101.0 2. Inability to urinate 3. Continued bleeding from incision. 4. Increased pain, redness, or drainage from the  incision. 5. Increasing abdominal pain  The clinic staff is available to answer your questions during regular business hours.  Please dont hesitate to call and ask to speak to one of the nurses for clinical concerns.  If you have a medical emergency, go to the nearest emergency room or call 911.  A surgeon from [facility] is always on call at the hospital. [address], [place], [place]  [address] ? [address] [address], [place], [place]   [address] [phone] ? [phone] ? FAX [phone] Web site: [email or web address]

## 2017-11-14 NOTE — Discharge Summary (Signed)
[facility] Surgery/Trauma Discharge Summary   Patient ID: Nancy Hurst MRN: [id number] DOB/AGE: Jan 18, 1991 27 y.o.  Admit date: 11/03/2017 Discharge date: 11/06/2017  Admitting Diagnosis: appendicitis  Discharge Diagnosis Patient Active Problem List   Diagnosis Date Noted  . Perforated appendicitis 11/03/2017    Consultants none  Imaging: No results found.  Procedures Dr. Donell Beers (11/03/17) - Laparoscopic Appendectomy  HPI: 27 year old female who presents to the emergency department with 4 to 5 days of abdominal pain and pelvic pain.  She was seen in the emergency department approximately 3 days ago and diagnosed with pelvic inflammatory disease.  She was sent home on p.o. antibiotics.  Her pain worsened and become localized to the right lower quadrant.  On presentation to the ED now she has a white count of 24.  CT shows signs concerning for appendicitis with possible perforation.   Hospital Course:  Patient was admitted and underwent procedure listed above.  Tolerated procedure well and was transferred to the floor.  Diet was advanced as tolerated.  On POD#3, the patient was voiding well, tolerating diet, ambulating well, pain well controlled, vital signs stable, incisions c/d/i and felt stable for discharge home.  Patient will follow up as outlined below and knows to call with questions or concerns.     I did not see nor take part in this patient's care. All information provided in this discharge summary was taken from the patient's EMR.   Physical Exam: Please see progress note by Dr. Donell Beers on 09/29.   Allergies as of 11/06/2017   No Known Allergies     Medication List    TAKE these medications   acetaminophen 500 MG tablet Commonly known as:  TYLENOL Take 1,000 mg by mouth every 6 (six) hours as needed for mild pain.   doxycycline 100 MG capsule Commonly known as:  VIBRAMYCIN Take 1 capsule (100 mg total) by mouth 2 (two) times daily for 14  days.   ibuprofen 600 MG tablet Commonly known as:  ADVIL,MOTRIN Take 1 tablet (600 mg total) by mouth every 6 (six) hours as needed for mild pain. What changed:  medication strength   naproxen 500 MG tablet Commonly known as:  NAPROSYN Take 1 tablet (500 mg total) by mouth 2 (two) times daily for 14 days.   ondansetron 4 MG disintegrating tablet Commonly known as:  ZOFRAN-ODT Take 1 tablet (4 mg total) by mouth every 8 (eight) hours as needed for nausea or vomiting.   oxyCODONE 5 MG immediate release tablet Commonly known as:  Oxy IR/ROXICODONE Take 1-2 tablets (5-10 mg total) by mouth every 4 (four) hours as needed for moderate pain.   terconazole 0.8 % vaginal cream Commonly known as:  TERAZOL 3 Place 1 applicator vaginally at bedtime.        Follow-up Information    [facility] Follow up on 11/17/2017.   Specialty:  General Surgery Why:  Your appointment is at 2:30 pm. Be at the office 30 minutes early for check in.  Bring photo ID and insurance information with you.   Contact information: [address] [place] [address] [phone]           Signed: Joyce Copa [facility] 11/14/2017, 3:11 PM Pager: [phone] Consults: [phone] Mon-Fri 7:00 am-4:30 pm Sat-Sun 7:00 am-11:30 am

## 2018-05-31 ENCOUNTER — Other Ambulatory Visit (HOSPITAL_COMMUNITY): Payer: Self-pay | Admitting: *Deleted

## 2018-05-31 ENCOUNTER — Telehealth (HOSPITAL_COMMUNITY): Payer: Self-pay | Admitting: *Deleted

## 2018-05-31 DIAGNOSIS — N6320 Unspecified lump in the left breast, unspecified quadrant: Secondary | ICD-10-CM

## 2018-05-31 NOTE — Telephone Encounter (Signed)
Telephoned patient at home number and confirmed [facility] appointment April 23. No symptoms of COVID-19. No contact with someone with a confirmed diagnosis of COVID-19. No travel outside of [place] in the past 14 days. Used interpreter Delorise Royals.

## 2018-06-01 ENCOUNTER — Encounter (HOSPITAL_COMMUNITY): Payer: Self-pay

## 2018-06-01 ENCOUNTER — Other Ambulatory Visit: Payer: Self-pay

## 2018-06-01 ENCOUNTER — Other Ambulatory Visit (HOSPITAL_COMMUNITY): Payer: Self-pay | Admitting: Obstetrics and Gynecology

## 2018-06-01 ENCOUNTER — Ambulatory Visit
Admission: RE | Admit: 2018-06-01 | Discharge: 2018-06-01 | Disposition: A | Discharge status: Home / Self Care | Payer: No Typology Code available for payment source | Source: Ambulatory Visit | Attending: Obstetrics and Gynecology | Admitting: Obstetrics and Gynecology

## 2018-06-01 ENCOUNTER — Ambulatory Visit (HOSPITAL_COMMUNITY)
Admission: RE | Admit: 2018-06-01 | Discharge: 2018-06-01 | Disposition: A | Discharge status: Home / Self Care | Payer: Self-pay | Source: Ambulatory Visit | Attending: Obstetrics and Gynecology | Admitting: Obstetrics and Gynecology

## 2018-06-01 VITALS — BP 100/62 | Temp 97.6°F | Wt 99.0 lb

## 2018-06-01 DIAGNOSIS — N6320 Unspecified lump in the left breast, unspecified quadrant: Secondary | ICD-10-CM

## 2018-06-01 DIAGNOSIS — N6323 Unspecified lump in the left breast, lower outer quadrant: Secondary | ICD-10-CM

## 2018-06-01 DIAGNOSIS — N6321 Unspecified lump in the left breast, upper outer quadrant: Secondary | ICD-10-CM

## 2018-06-01 DIAGNOSIS — Z01419 Encounter for gynecological examination (general) (routine) without abnormal findings: Secondary | ICD-10-CM

## 2018-06-01 NOTE — Addendum Note (Signed)
Encounter addended by: Lynnell Dike, LPN on: 1/59/4585 11:07 AM  Actions taken: Order list changed

## 2018-06-01 NOTE — Patient Instructions (Signed)
Explained breast self awareness with [name]. Explained to patient that  [facility] will cover Pap smears every 3 years unless has a history of abnormal Pap smears. Referred patient to the [facility] for a left breast ultrasound. Appointment scheduled for Thursday, June 01, 2018 at 1240. Patient aware of appointment and will be there. Let patient know will follow up with her within the next couple weeks with results of Pap smear by letter or phone. Nancy Hurst verbalized understanding.  Mattheo Swindle, Kathaleen Maser, RN 10:37 AM

## 2018-06-01 NOTE — Progress Notes (Signed)
Complaints of left breast lump x 3 months.  Pap Smear: Pap smear completed today. Per patient has never had a Pap smear completed. No Pap smear results are in Epic.  Physical exam: Breasts Breasts symmetrical. No skin abnormalities bilateral breasts. No nipple retraction bilateral breasts. No nipple discharge bilateral breasts. No lymphadenopathy. No lumps palpated right breast. Palpated two pea sized lumps within the left breast at 5 o'clock 3 cm from the nipple and a mobile lump at a 2 o'clock 7 cm from the nipple. Complaints of pain when palpated the left breast lump at 2 o'clock. Referred [facility] for a left breast ultrasound. Appointment scheduled for Thursday, June 01, 2018 at 1240.        Pelvic/Bimanual   Ext Genitalia No lesions, no swelling and no discharge observed on external genitalia.         Vagina Vagina pink and normal texture. No lesions or discharge observed in vagina.          Cervix Cervix is present. Cervix pink and of normal texture. No discharge observed.     Uterus Uterus is present and palpable. Uterus in normal position and normal size.        Adnexae Bilateral ovaries present and palpable. No tenderness on palpation.         Rectovaginal No rectal exam completed today since patient had no rectal complaints. No skin abnormalities observed on exam.    Smoking History: Patient has never smoked.  Patient Navigation: Patient education provided. Access to services provided for patient through [facility] program. Spanish interpreter provided.   Breast and Cervical Cancer Risk Assessment: Patient has no family history of breast cancer, known genetic mutations, or radiation treatment to the chest before age 2. Patient has no history of cervical dysplasia, immunocompromised, or DES exposure in-utero. Breast cancer risk assessment completed. Unable to calculate breast cancer risk due to patient is less than 29 years old.  Used  Spanish interpreter [employer] from [employer].

## 2018-06-02 LAB — CYTOLOGY - PAP: Diagnosis: NEGATIVE

## 2018-06-21 ENCOUNTER — Ambulatory Visit: Payer: Self-pay | Admitting: Nurse Practitioner

## 2018-06-26 ENCOUNTER — Encounter (HOSPITAL_COMMUNITY): Payer: Self-pay | Admitting: *Deleted

## 2018-06-29 ENCOUNTER — Encounter (HOSPITAL_COMMUNITY): Payer: Self-pay | Admitting: *Deleted

## 2018-06-29 NOTE — Progress Notes (Signed)
Letter mailed to patient with negative pap smear results. Next pap smear due in three years.  

## 2018-11-03 ENCOUNTER — Other Ambulatory Visit: Payer: Self-pay

## 2018-11-03 ENCOUNTER — Encounter (HOSPITAL_COMMUNITY): Payer: Self-pay | Admitting: Emergency Medicine

## 2018-11-03 ENCOUNTER — Observation Stay (HOSPITAL_COMMUNITY)
Admission: EM | Admit: 2018-11-03 | Discharge: 2018-11-04 | Disposition: A | Discharge status: Home / Self Care | Payer: No Typology Code available for payment source | Attending: Family Medicine | Admitting: Family Medicine

## 2018-11-03 DIAGNOSIS — Z87440 Personal history of urinary (tract) infections: Secondary | ICD-10-CM | POA: Insufficient documentation

## 2018-11-03 DIAGNOSIS — N390 Urinary tract infection, site not specified: Principal | ICD-10-CM | POA: Diagnosis present

## 2018-11-03 DIAGNOSIS — Z20828 Contact with and (suspected) exposure to other viral communicable diseases: Secondary | ICD-10-CM | POA: Insufficient documentation

## 2018-11-03 DIAGNOSIS — L299 Pruritus, unspecified: Secondary | ICD-10-CM | POA: Insufficient documentation

## 2018-11-03 DIAGNOSIS — Z79899 Other long term (current) drug therapy: Secondary | ICD-10-CM | POA: Insufficient documentation

## 2018-11-03 LAB — URINALYSIS, ROUTINE W REFLEX MICROSCOPIC
Bilirubin Urine: NEGATIVE
Glucose, UA: NEGATIVE mg/dL
Ketones, ur: NEGATIVE mg/dL
Nitrite: NEGATIVE
Protein, ur: NEGATIVE mg/dL
Specific Gravity, Urine: 1.008 (ref 1.005–1.030)
pH: 5 (ref 5.0–8.0)

## 2018-11-03 LAB — BASIC METABOLIC PANEL
Anion gap: 5 (ref 5–15)
BUN: 7 mg/dL (ref 6–20)
CO2: 27 mmol/L (ref 22–32)
Calcium: 9.7 mg/dL (ref 8.9–10.3)
Chloride: 104 mmol/L (ref 98–111)
Creatinine, Ser: 0.44 mg/dL (ref 0.44–1.00)
GFR calc Af Amer: >60 mL/min (ref >60)
GFR calc non Af Amer: >60 mL/min (ref >60)
Glucose, Bld: 95 mg/dL (ref 70–99)
Potassium: 4.9 mmol/L (ref 3.5–5.1)
Sodium: 136 mmol/L (ref 135–145)

## 2018-11-03 LAB — CBC
HCT: 39.3 % (ref 36.0–46.0)
Hemoglobin: 13.3 g/dL (ref 12.0–15.0)
MCH: 29 pg (ref 26.0–34.0)
MCHC: 33.8 g/dL (ref 30.0–36.0)
MCV: 85.6 fL (ref 80.0–100.0)
Platelets: 475 10*3/uL — ABNORMAL HIGH (ref 150–400)
RBC: 4.59 MIL/uL (ref 3.87–5.11)
RDW: 15 % (ref 11.5–15.5)
WBC: 9.4 10*3/uL (ref 4.0–10.5)
nRBC: 0 % (ref 0.0–0.2)

## 2018-11-03 LAB — WET PREP, GENITAL
Clue Cells Wet Prep HPF POC: NONE SEEN
Sperm: NONE SEEN
Trich, Wet Prep: NONE SEEN
Yeast Wet Prep HPF POC: NONE SEEN

## 2018-11-03 LAB — SARS CORONAVIRUS 2 (TAT 6-24 HRS): SARS Coronavirus 2: NEGATIVE

## 2018-11-03 LAB — I-STAT BETA HCG BLOOD, ED (MC, WL, AP ONLY): I-stat hCG, quantitative: <5 m[IU]/mL (ref <5)

## 2018-11-03 MED ORDER — ACETAMINOPHEN 325 MG PO TABS: 650.0000 mg | ORAL_TABLET | Freq: Four times a day (QID) | ORAL | Status: DC | PRN

## 2018-11-03 MED ORDER — SODIUM CHLORIDE 0.9 % IV SOLN
2.0000 g | Freq: Two times a day (BID) | INTRAVENOUS | Status: DC
  Administered 2018-11-04: 05:00:00 2 g via INTRAVENOUS
  Filled 2018-11-03 (×2): qty 2

## 2018-11-03 MED ORDER — ENOXAPARIN SODIUM 30 MG/0.3ML ~~LOC~~ SOLN: 30.0000 mg | SUBCUTANEOUS | Status: DC

## 2018-11-03 MED ORDER — SODIUM CHLORIDE 0.9 % IV BOLUS
1000.0000 mL | Freq: Once | INTRAVENOUS | Status: AC
  Administered 2018-11-03: 1000 mL via INTRAVENOUS

## 2018-11-03 MED ORDER — SODIUM CHLORIDE 0.9 % IV SOLN
1.0000 g | Freq: Once | INTRAVENOUS | Status: AC
  Administered 2018-11-03: 1 g via INTRAVENOUS
  Filled 2018-11-03: qty 1

## 2018-11-03 NOTE — ED Notes (Signed)
ED TO INPATIENT HANDOFF REPORT  ED Nurse Name and Phone #: Annie Main [phone]  S Name/Age/Gender Nancy Hurst 28 y.o. female Room/Bed: 036C/036C  Code Status   Code Status: Full Code  Home/SNF/Other Home Patient oriented to: self, place, time and situation Is this baseline? Yes   Triage Complete: Triage complete  Chief Complaint SENT BY DR/NEED IV ANITBOTICS  Triage Note Sent from [facility] for IV antibiotics -- has resistant UTI per information sent from office.     Allergies No Known Allergies  Level of Care/Admitting Diagnosis ED Disposition    ED Disposition Condition [facility] Area: [place] [100100]  Level of Care: Med-Surg [16]  Covid Evaluation: Asymptomatic Screening Protocol (No Symptoms)  Diagnosis: Recurrent UTI [625638]  Admitting Physician: Danna Hefty [[id number]]  Attending Physician: Leeanne Rio [[id number]  PT Class (Do Not Modify): Observation [104]  PT Acc Code (Do Not Modify): Observation [10022]       B Medical/Surgery History History reviewed. No pertinent past medical history. Past Surgical History:  Procedure Laterality Date  . BREAST SURGERY     left breast  . LAPAROSCOPIC APPENDECTOMY N/A 11/03/2017   Procedure: APPENDECTOMY LAPAROSCOPIC;  Surgeon: Stark Klein, MD;  Location: [facility];  Service: General;  Laterality: N/A;     A IV Location/Drains/Wounds Patient Lines/Drains/Airways Status   Active Line/Drains/Airways    Name:   Placement date:   Placement time:   Site:   Days:   Peripheral IV   -    -    -      Peripheral IV 11/03/18 Left Antecubital   11/03/18    1647    Antecubital   less than 1   Incision - 3 Ports Abdomen 1: Umbilicus;Lower 2: Umbilicus 3: Right;Lateral;Lower   11/03/17    1212     365          Intake/Output Last 24 hours No intake or output data in the 24 hours ending 11/03/18 1837  Labs/Imaging Results for orders placed or  performed during the hospital encounter of 11/03/18 (from the past 48 hour(s))  CBC     Status: Abnormal   Collection Time: 11/03/18 12:34 PM  Result Value Ref Range   WBC 9.4 4.0 - 10.5 K/uL   RBC 4.59 3.87 - 5.11 MIL/uL   Hemoglobin 13.3 12.0 - 15.0 g/dL   HCT 39.3 36.0 - 46.0 %   MCV 85.6 80.0 - 100.0 fL   MCH 29.0 26.0 - 34.0 pg   MCHC 33.8 30.0 - 36.0 g/dL   RDW 15.0 11.5 - 15.5 %   Platelets 475 (H) 150 - 400 K/uL   nRBC 0.0 0.0 - 0.2 %    Comment: Performed at [facility], [place] [address], [place], [place] [address]  Basic metabolic panel     Status: None   Collection Time: 11/03/18 12:34 PM  Result Value Ref Range   Sodium 136 135 - 145 mmol/L   Potassium 4.9 3.5 - 5.1 mmol/L   Chloride 104 98 - 111 mmol/L   CO2 27 22 - 32 mmol/L   Glucose, Bld 95 70 - 99 mg/dL   BUN 7 6 - 20 mg/dL   Creatinine, Ser 0.44 0.44 - 1.00 mg/dL   Calcium 9.7 8.9 - 10.3 mg/dL   GFR calc non Af Amer >60 >60 mL/min   GFR calc Af Amer >60 >60 mL/min   Anion gap 5 5 -  15    Comment: Performed at [facility], [address] [place], [place] [address]  I-Stat beta hCG blood, ED     Status: None   Collection Time: 11/03/18 12:42 PM  Result Value Ref Range   I-stat hCG, quantitative <5.0 <5 mIU/mL   Comment 3            Comment:   GEST. AGE      CONC.  (mIU/mL)   <=1 WEEK        5 - 50     2 WEEKS       50 - 500     3 WEEKS       100 - 10,000     4 WEEKS     1,000 - 30,000        FEMALE AND NON-PREGNANT FEMALE:     LESS THAN 5 mIU/mL   Urinalysis, Routine w reflex microscopic- may I&O cath if menses     Status: Abnormal   Collection Time: 11/03/18  3:47 PM  Result Value Ref Range   Color, Urine YELLOW YELLOW   APPearance HAZY (A) CLEAR   Specific Gravity, Urine 1.008 1.005 - 1.030   pH 5.0 5.0 - 8.0   Glucose, UA NEGATIVE NEGATIVE mg/dL   Hgb urine dipstick SMALL (A) NEGATIVE   Bilirubin Urine NEGATIVE NEGATIVE   Ketones, ur NEGATIVE NEGATIVE mg/dL   Protein, ur NEGATIVE  NEGATIVE mg/dL   Nitrite NEGATIVE NEGATIVE   Leukocytes,Ua SMALL (A) NEGATIVE   RBC / HPF 0-5 0 - 5 RBC/hpf   WBC, UA 21-50 0 - 5 WBC/hpf   Bacteria, UA MANY (A) NONE SEEN   Squamous Epithelial / LPF 6-10 0 - 5   Mucus PRESENT     Comment: Performed at [facility], [address] [place], [place] [address]  Wet prep, genital     Status: Abnormal   Collection Time: 11/03/18  3:54 PM   Specimen: PATH Cytology Cervicovaginal Ancillary Only  Result Value Ref Range   Yeast Wet Prep HPF POC NONE SEEN NONE SEEN   Trich, Wet Prep NONE SEEN NONE SEEN   Clue Cells Wet Prep HPF POC NONE SEEN NONE SEEN   WBC, Wet Prep HPF POC MANY (A) NONE SEEN   Sperm NONE SEEN     Comment: Performed at [facility], [address] [place], [place] [address]   No results found.  Pending Labs Unresulted Labs (From admission, onward)    Start     Ordered   11/04/18 0500  Comprehensive metabolic panel  Tomorrow morning,   R     11/03/18 1827   11/04/18 0500  CBC  Tomorrow morning,   R     11/03/18 1827   11/03/18 1644  SARS CORONAVIRUS 2 (TAT 6-24 HRS) Nasopharyngeal Nasopharyngeal Swab  (Asymptomatic/Tier 2 Patients Labs)  Once,   STAT    Question Answer Comment  Is this test for diagnosis or screening Screening   Symptomatic for COVID-19 as defined by CDC No   Hospitalized for COVID-19 No   Admitted to ICU for COVID-19 No   Previously tested for COVID-19 No   Resident in a congregate (group) care setting No   Employed in healthcare setting No   Pregnant No      11/03/18 1643   11/03/18 1554  RPR  (STI Exposure)  Once,   STAT     11/03/18 1553   11/03/18 1554  HIV  antibody  (STI Exposure)  Once,   STAT     11/03/18 1553   11/03/18 1227  Urine C&S  Once,   STAT    Question:  Patient immune status  Answer:  Normal   11/03/18 1226          Vitals/Pain Today's Vitals   11/03/18 1526 11/03/18 1538 11/03/18 1830 11/03/18 1836  BP:   102/76   Pulse:   85   Resp:      Temp:       TempSrc:      SpO2:   100%   Weight:      Height:      PainSc: 0-No pain 0-No pain  0-No pain    Isolation Precautions No active isolations  Medications Medications  enoxaparin (LOVENOX) injection 40 mg (has no administration in time range)  acetaminophen (TYLENOL) tablet 650 mg (has no administration in time range)  ceFEPIme (MAXIPIME) 1 g in sodium chloride 0.9 % 100 mL IVPB (0 g Intravenous Stopped 11/03/18 1809)  sodium chloride 0.9 % bolus 1,000 mL (0 mLs Intravenous Stopped 11/03/18 1809)    Mobility walks Low fall risk   Focused Assessments    R Recommendations: See Admitting Provider Note  Report given to:   Additional Notes:

## 2018-11-03 NOTE — ED Notes (Signed)
Attempted report 

## 2018-11-03 NOTE — ED Notes (Signed)
Pt providing urine sample at this time.

## 2018-11-03 NOTE — ED Provider Notes (Signed)
[facility] EMERGENCY DEPARTMENT Provider Note   CSN: [id number] Arrival date & time: 11/03/18  1208     History   Chief Complaint Chief Complaint  Patient presents with  . Recurrent UTI    HPI Christella App Tawni Millers is a 28 y.o. female.     The history is provided by the patient and the spouse. The history is limited by a language barrier. A language interpreter was used.     28 year old female sent here from [facility] for managements of resistant UTI.  Patient is a Spanish-speaking, history obtained today with interpreter.  Patient states for the past year she has had recurrent urinary tract infection requiring antibiotic use on a monthly basis.  For the past 5 days she endorsed urinary discomfort including burning urination frequency and urgency with irritation.  She was seen by her PCP 5 days ago for symptoms.  She was notified today at that urine culture has came back showing the patient is resistant to a lot of different types of antibiotic and will require IV antibiotic.  Patient was recommended to come to ER.  She does complain of some mild suprapubic tenderness but otherwise denies having fever chills no nausea vomiting diarrhea no vaginal bleeding or vaginal discharge.  Her last menstrual period was September 1.  She mentioned last antibiotic that she was taking was amoxicillin.  History reviewed. No pertinent past medical history.  Patient Active Problem List   Diagnosis Date Noted  . Perforated appendicitis 11/03/2017    Past Surgical History:  Procedure Laterality Date  . BREAST SURGERY     left breast  . LAPAROSCOPIC APPENDECTOMY N/A 11/03/2017   Procedure: APPENDECTOMY LAPAROSCOPIC;  Surgeon: Almond Lint, MD;  Location: [facility];  Service: General;  Laterality: N/A;     OB History    Gravida  0   Para  0   Term  0   Preterm  0   AB  0   Living  0     SAB  0   TAB  0   Ectopic  0   Multiple  0   Live  Births  0            Home Medications    Prior to Admission medications   Medication Sig Start Date End Date Taking? Authorizing Provider  acetaminophen (TYLENOL) 500 MG tablet Take 1,000 mg by mouth every 6 (six) hours as needed for mild pain.    [provider]  ibuprofen (ADVIL,MOTRIN) 600 MG tablet Take 1 tablet (600 mg total) by mouth every 6 (six) hours as needed for mild pain. 11/06/17   Almond Lint, MD  ondansetron (ZOFRAN ODT) 4 MG disintegrating tablet Take 1 tablet (4 mg total) by mouth every 8 (eight) hours as needed for nausea or vomiting. Patient not taking: Reported on 06/01/2018 10/31/17   Liberty Handy, PA-C  oxyCODONE (OXY IR/ROXICODONE) 5 MG immediate release tablet Take 1-2 tablets (5-10 mg total) by mouth every 4 (four) hours as needed for moderate pain. Patient not taking: Reported on 06/01/2018 11/06/17   Almond Lint, MD  terconazole (TERAZOL 3) 0.8 % vaginal cream Place 1 applicator vaginally at bedtime. Patient not taking: Reported on 11/03/2017 04/09/17   Janne Napoleon, NP    Family History No family history on file.  Social History Social History   Tobacco Use  . Smoking status: Never Smoker  . Smokeless tobacco: Never Used  Substance Use Topics  .  Alcohol use: No    Frequency: Never  . Drug use: No     Allergies   Patient has no known allergies.   Review of Systems Review of Systems  All other systems reviewed and are negative.    Physical Exam Updated Vital Signs BP 103/73   Pulse 81   Temp 98 F (36.7 C) (Oral)   Resp 16   Ht 5\' 3"  (1.6 m)   Wt 47.2 kg   LMP 10/10/2018   SpO2 100%   BMI 18.42 kg/m   Physical Exam Vitals signs and nursing note reviewed.  Constitutional:      General: She is not in acute distress.    Appearance: She is well-developed.  HENT:     Head: Atraumatic.  Eyes:     Conjunctiva/sclera: Conjunctivae normal.  Neck:     Musculoskeletal: Neck supple.  Cardiovascular:     Rate and  Rhythm: Normal rate and regular rhythm.     Pulses: Normal pulses.     Heart sounds: Normal heart sounds.  Pulmonary:     Breath sounds: Normal breath sounds. No wheezing, rhonchi or rales.  Abdominal:     Palpations: Abdomen is soft.     Tenderness: There is abdominal tenderness (Mild suprapubic tenderness without guarding or rebound tenderness.). There is no right CVA tenderness or left CVA tenderness.  Genitourinary:    Comments: Chaperone present during exam.  No inguinal lymphadenopathy or inguinal hernia noted.  Normal external genitalia no significant discomfort with speculum insertion.  Mild vaginal discharge noted.  Close cervical os.  On bimanual examination, no adnexal tenderness or cervical motion tenderness. Skin:    Findings: No rash.  Neurological:     Mental Status: She is alert and oriented to person, place, and time.  Psychiatric:        Mood and Affect: Mood normal.      ED Treatments / Results  Labs (all labs ordered are listed, but only abnormal results are displayed) Labs Reviewed  WET PREP, GENITAL - Abnormal; Notable for the following components:      Result Value   WBC, Wet Prep HPF POC MANY (*)    All other components within normal limits  URINALYSIS, ROUTINE W REFLEX MICROSCOPIC - Abnormal; Notable for the following components:   APPearance HAZY (*)    Hgb urine dipstick SMALL (*)    Leukocytes,Ua SMALL (*)    Bacteria, UA MANY (*)    All other components within normal limits  CBC - Abnormal; Notable for the following components:   Platelets 475 (*)    All other components within normal limits  URINE CULTURE  SARS CORONAVIRUS 2 (TAT 6-24 HRS)  BASIC METABOLIC PANEL  RPR  HIV ANTIBODY (ROUTINE TESTING W REFLEX)  I-STAT BETA HCG BLOOD, ED ([facility], [facility], [facility] ONLY)  GC/CHLAMYDIA PROBE AMP ([place]) NOT AT [facility]    EKG None  Radiology No results found.  Procedures Procedures (including critical care time)  Medications Ordered in ED Medications   ceFEPIme (MAXIPIME) 1 g in sodium chloride 0.9 % 100 mL IVPB (1 g Intravenous New Bag/Given 11/03/18 1654)  sodium chloride 0.9 % bolus 1,000 mL (1,000 mLs Intravenous New Bag/Given 11/03/18 1645)     Initial Impression / Assessment and Plan / ED Course  I have reviewed the triage vital signs and the nursing notes.  Pertinent labs & imaging results that were available during my care of the patient were reviewed by me and considered in my  medical decision making (see chart for details).        BP 103/73   Pulse 81   Temp 98 F (36.7 C) (Oral)   Resp 16   Ht 5\' 3"  (1.6 m)   Wt 47.2 kg   LMP 10/10/2018   SpO2 100%   BMI 18.42 kg/m    Final Clinical Impressions(s) / ED Diagnoses   Final diagnoses:  Recurrent UTI    ED Discharge Orders    None     3:46 PM Patient with history of recurrent urinary tract infection coming to the ED today with a urine culture indicating E Coli >100,000 showing significant resistance to multiple antibiotics.  Patient is sensitive to amikacin, cefepime, gentamicin, meropenem, and Zosyn.  Plan to initiate cefepime IV and will admit patient for IV antibiotic.  Patient does complain of itchiness to her vaginal region, due to recurrent urinary tract infection, I will also perform pelvic examination to assess for potential STI. No CVA tenderness to suggest Pyelonephritis.   5:03 PM Labs are reassuring, no signficant finding on pelvic examination, no evidence to suggest PID.  [facility] who agrees to see and admit pt.    Fayrene Helperran, Jadier Rockers, PA-C 11/03/18 1705    Derwood KaplanNanavati, Ankit, MD 11/04/18 1434

## 2018-11-03 NOTE — Progress Notes (Addendum)
Pharmacy Antibiotic Note  Nancy Hurst [place] is a 28 y.o. female admitted on 11/03/2018 with resistant UTI. Complaining of dysuria and suprapubic pain for 5 days due to UTI. PMH is significant for appendicitis, PID, recurrent UTIs in the last year. Afebrile, WBC 9.4, Scr 0.44. Pharmacy has been consulted for cefepime dosing.  [place] [facility] had a urine culture, which revealed E. Coli >100,000 CFUs and was only sensitive to the following IV antibiotics: cefepime, gentamicin, meropenem, amikacin, and piperacillin-tazobactam.   Received Cefepime 1 g IV 1x in the ED  Plan: Cefepime 2 g IV q12h Monitor clinical improvement and renal function Monitor C/s  Height: 5\' 3"  (160 cm) Weight: 104 lb (47.2 kg) IBW/kg (Calculated) : 52.4  Temp (24hrs), Avg:98 F (36.7 C), Min:98 F (36.7 C), Max:98 F (36.7 C)  Recent Labs  Lab 11/03/18 1234  WBC 9.4  CREATININE 0.44    Estimated Creatinine Clearance: 78.7 mL/min (by C-G formula based on SCr of 0.44 mg/dL).    No Known Allergies  Antimicrobials this admission: Cefepime 9/25 >>  Microbiology results: 9/25 Chlamydia probe: pending 9/25 RPR: pending 9/25 UCx: pending 9/25 COVID: pending   Thank you for allowing pharmacy to be a part of this patient's care.  Lorel Monaco, PharmD PGY1 Ambulatory Care Resident [employer] # [phone]

## 2018-11-03 NOTE — H&P (Addendum)
Family Medicine Teaching [facility] Admission History and Physical Service Pager: [phone]  Patient name: Nancy Hurst Medical record number: [id number] Date of birth: [birth year] Age: 28 y.o. Gender: female  Primary Care Provider: Patient, No Pcp Per Consultants: None Code Status: Full Preferred Emergency Contact: [name] (Husband) [phone]  Chief Complaint: recurrent UTI w/ abx resistance  Assessment and Plan: Nancy Hurst is a 28 y.o. female presenting with dysuria and suprapubic pain for 5 days due to UTI. PMH is significant for appendicitis, PID, recurrent UTIs in the last year.  Spanish Interpreter: [phone]  Multi-drug resistant UTI: 28 yo otherwise healthy woman presents with recurrent UTI. Patient has had dysuria, pruritus, urinary frequency, and suprapubic pain x5 days.  She has had UTIs multiple times this year, and treated with several antibiotics and anti-fungals.  She went to her family doctor at the [facility] clinic and had a urine culture, which revealed E. Coli >100,000 CFUs and was only sensitive to the following IV antibiotics: cefepime, gentamicin, meropenem, amikacin, and piperacillin-tazobactam.  Patient is afebrile; exam is only remarkable for some suprapubic tenderness and no CVA tenderness which is reassuring.  BMP and CBC unremarkable.  Wet prep only revealed many WBCs.  UA positive for small hemoglobin, small leukocytes, negative nitrites, urine microscopy with many bacteria and WBCs.  Urine culture and vaginal swab obtained. Pregnancy test negative. Patient had a h/o PID in 2019, however pelvic exam today was benign per ED provider. CT ab/pelvis in 2019 revealed no renal or bladder anatomic abnormalities, however can consider renal/pelvic US. HIV, RPR, GC/chlamydia, labs pending. Patient is only sexually active with her husband, they do not use condoms as they were trying to get pregnant this year. The increased sexual activity without  condoms may be the source of the recurrent UTIs with possible outpatient over-treatment as there do not appear to be any anatomic abnormalities. ID was curbsided and suggested that this was likely over-treatment in the outpatient setting, with recommendation to treat with IV abx for 1-2 days followed by fosfomycin 3g x1d. - Admit to FPTS, med-surg, attending Dr. Pollie Meyer - continue IV cefepime - pyridium - consider outpatient urology referral given frequency of UTIs - f/u GC, chlamydia, RPR, HIV - consider prophylactic treatment as an outpatient - tylenol PRN - consider transition to PO Fosfomycin 3g x 1 dose and close follow up with PCP  Vaginal Pruritus Prep today did not reveal any candidiasis.  Patient recently treated with fluconazole as an outpatient.  Likely related to recurrent UTI. -See above for plan  FEN/GI: regular diet Prophylaxis: lovenox  Disposition: to med-surg for further medical work up and IV antibiotics  History of Present Illness:  Nancy Hurst is a 28 y.o. female presenting from PCP with recurrent UTI. Patient notes UTI started 5 days ago, endorsing burning and itching. She notes she has had 15 UTI over this past year. She has been treated with "antibiotics" almost continuously over this past year. She notes she has taken Amoxicillin and Fluconazole in the past. She notes last time she took antibiotics was 5 days ago.    She went to PCP around 9/11 and had UA performed. Was given 7 day course of Amoxicillin. She has no improvement in symptoms thus went to PCP on Monday and had another UA performed. Was given Fluconazole at that time. She received call from PCP today due to results of UA culture and was told to come to ED given results.   Patient has  never been pregnant, but is sexually active when able. She is sexually active with husband. Denies any recent travel. Patient has history of appendectomy in 2019, otherwise no other surgeries.  Patient does  have a history of PID in 2019 that was treated with antibiotics. Her first UTI was at 28 years old when she was in [place] that was treated and resolved without issue. Problem with recurrent UTIs started this year. Denies any UTI's as a child or baby. Denies any known personal or family history of renal abnormalities. Denies any douching or excessive higiene of vaginal area. Last pap in 05/2018 negative. Denies any history of STDs.   Review Of Systems: Per HPI with the following additions:   Review of Systems  Constitutional: Negative for chills and fever.  HENT: Negative for congestion and sore throat.   Respiratory: Negative for cough and shortness of breath.   Cardiovascular: Negative for chest pain.  Gastrointestinal: Negative for diarrhea, nausea and vomiting.  Genitourinary: Positive for dysuria and frequency. Negative for flank pain and hematuria.  Skin: Negative for rash.    Patient Active Problem List   Diagnosis Date Noted  . Perforated appendicitis 11/03/2017    Past Medical History: History reviewed. No pertinent past medical history.  Past Surgical History: Past Surgical History:  Procedure Laterality Date  . BREAST SURGERY     left breast  . LAPAROSCOPIC APPENDECTOMY N/A 11/03/2017   Procedure: APPENDECTOMY LAPAROSCOPIC;  Surgeon: Stark Klein, MD;  Location: [facility];  Service: General;  Laterality: N/A;    Social History: Social History   Tobacco Use  . Smoking status: Never Smoker  . Smokeless tobacco: Never Used  Substance Use Topics  . Alcohol use: No    Frequency: Never  . Drug use: No   Additional social history: Originally from [place]. Lives with Husband, who is her only sexual partner. Please also refer to relevant sections of EMR.  Family History: No family history on file. No family hx of kidney problems  Allergies and Medications: No Known Allergies No current facility-administered medications on file prior to encounter.    Current  Outpatient Medications on File Prior to Encounter  Medication Sig Dispense Refill  . acetaminophen (TYLENOL) 500 MG tablet Take 1,000 mg by mouth every 6 (six) hours as needed for mild pain.    Marland Kitchen ibuprofen (ADVIL,MOTRIN) 600 MG tablet Take 1 tablet (600 mg total) by mouth every 6 (six) hours as needed for mild pain. 30 tablet 0  . ondansetron (ZOFRAN ODT) 4 MG disintegrating tablet Take 1 tablet (4 mg total) by mouth every 8 (eight) hours as needed for nausea or vomiting. (Patient not taking: Reported on 06/01/2018) 20 tablet 0  . oxyCODONE (OXY IR/ROXICODONE) 5 MG immediate release tablet Take 1-2 tablets (5-10 mg total) by mouth every 4 (four) hours as needed for moderate pain. (Patient not taking: Reported on 06/01/2018) 20 tablet 0  . terconazole (TERAZOL 3) 0.8 % vaginal cream Place 1 applicator vaginally at bedtime. (Patient not taking: Reported on 11/03/2017) 20 g 0    Objective: BP 103/73   Pulse 81   Temp 98 F (36.7 C) (Oral)   Resp 16   Ht 5\' 3"  (1.6 m)   Wt 47.2 kg   LMP 10/10/2018   SpO2 100%   BMI 18.42 kg/m  Exam: Physical Exam Constitutional:      General: She is not in acute distress.    Appearance: Normal appearance. She is normal weight. She is not ill-appearing or  toxic-appearing.  HENT:     Head: Normocephalic and atraumatic.     Nose: Nose normal.     Mouth/Throat:     Mouth: Mucous membranes are moist.  Eyes:     Conjunctiva/sclera: Conjunctivae normal.     Pupils: Pupils are equal, round, and reactive to light.  Cardiovascular:     Rate and Rhythm: Normal rate and regular rhythm.     Heart sounds: No murmur. No friction rub. No gallop.   Pulmonary:     Effort: Pulmonary effort is normal.     Breath sounds: Normal breath sounds. No wheezing, rhonchi or rales.  Abdominal:     General: Abdomen is flat. Bowel sounds are normal.     Palpations: Abdomen is soft.     Tenderness: There is abdominal tenderness (suprapubic). There is no right CVA tenderness, left  CVA tenderness, guarding or rebound.  Musculoskeletal:     Right lower leg: No edema.     Left lower leg: No edema.  Skin:    General: Skin is warm and dry.     Capillary Refill: Capillary refill takes less than 2 seconds.     Coloration: Skin is not jaundiced or pale.     Findings: No bruising, erythema or rash.  Neurological:     General: No focal deficit present.     Mental Status: She is alert and oriented to person, place, and time. Mental status is at baseline.  Psychiatric:        Mood and Affect: Mood normal.        Behavior: Behavior normal.     Labs and Imaging: CBC BMET  Recent Labs  Lab 11/03/18 1234  WBC 9.4  HGB 13.3  HCT 39.3  PLT 475*   Recent Labs  Lab 11/03/18 1234  NA 136  K 4.9  CL 104  CO2 27  BUN 7  CREATININE 0.44  GLUCOSE 95  CALCIUM 9.7      Shirlean MylarMahoney, Caitlin, MD 11/03/2018, 5:04 PM PGY-1, [facility] FPTS Intern pager: [phone], text pages welcome   Upper Level Addendum: I have seen and evaluated this patient along with Dr. Leary RocaMahoney and reviewed the above note, making necessary revisions in green.   General: well nourished, well developed, in no acute distress with non-toxic appearance, sitting comfortably in ED bed with husband at bedside Lungs: clear to auscultation bilaterally with normal work of breathing, breathing comfortably on room air Abdomen: soft, non-tender, non-distended, normoactive bowel sounds GU: Suprapubic pain to palpation. No pelvic exam given recent exam by ED provider.  Skin: warm, dry Extremities: warm and well perfused MSK: NO CVA tenderness Neuro: Alert and oriented, speech normal  Orpah CobbKiersten Masaru Chamberlin, D.O. 11/03/2018, 9:50 PM PGY-2, [place] [facility]

## 2018-11-03 NOTE — ED Triage Notes (Signed)
Sent from [place] [facility] for IV antibiotics -- has resistant UTI per information sent from office.

## 2018-11-03 NOTE — Plan of Care (Signed)

## 2018-11-04 DIAGNOSIS — N390 Urinary tract infection, site not specified: Secondary | ICD-10-CM

## 2018-11-04 LAB — CBC
HCT: 36.3 % (ref 36.0–46.0)
Hemoglobin: 12.2 g/dL (ref 12.0–15.0)
MCH: 28.9 pg (ref 26.0–34.0)
MCHC: 33.6 g/dL (ref 30.0–36.0)
MCV: 86 fL (ref 80.0–100.0)
Platelets: 421 10*3/uL — ABNORMAL HIGH (ref 150–400)
RBC: 4.22 MIL/uL (ref 3.87–5.11)
RDW: 15 % (ref 11.5–15.5)
WBC: 7.8 10*3/uL (ref 4.0–10.5)
nRBC: 0 % (ref 0.0–0.2)

## 2018-11-04 LAB — COMPREHENSIVE METABOLIC PANEL
ALT: 12 U/L (ref 0–44)
AST: 14 U/L — ABNORMAL LOW (ref 15–41)
Albumin: 3.5 g/dL (ref 3.5–5.0)
Alkaline Phosphatase: 36 U/L — ABNORMAL LOW (ref 38–126)
Anion gap: 8 (ref 5–15)
BUN: 12 mg/dL (ref 6–20)
CO2: 21 mmol/L — ABNORMAL LOW (ref 22–32)
Calcium: 8.7 mg/dL — ABNORMAL LOW (ref 8.9–10.3)
Chloride: 105 mmol/L (ref 98–111)
Creatinine, Ser: 0.59 mg/dL (ref 0.44–1.00)
GFR calc Af Amer: >60 mL/min (ref >60)
GFR calc non Af Amer: >60 mL/min (ref >60)
Glucose, Bld: 93 mg/dL (ref 70–99)
Potassium: 4.2 mmol/L (ref 3.5–5.1)
Sodium: 134 mmol/L — ABNORMAL LOW (ref 135–145)
Total Bilirubin: 0.5 mg/dL (ref 0.3–1.2)
Total Protein: 7.5 g/dL (ref 6.5–8.1)

## 2018-11-04 LAB — HIV ANTIBODY (ROUTINE TESTING W REFLEX): HIV Screen 4th Generation wRfx: NONREACTIVE

## 2018-11-04 MED ORDER — FOSFOMYCIN TROMETHAMINE 3 G PO PACK
3.0000 g | PACK | Freq: Once | ORAL | Status: AC
  Administered 2018-11-04: 3 g via ORAL
  Filled 2018-11-04: qty 3

## 2018-11-04 NOTE — Discharge Summary (Signed)
[facility] Discharge Summary  Patient name: Nancy Hurst Medical record number: [id number] Date of birth: 1990-07-24 Age: 28 y.o. Gender: female Date of Admission: 11/03/2018  Date of Discharge: 11/04/2018 Admitting Physician: Leeanne Rio, MD  Primary Care Provider: Patient, No Pcp Per Consultants: ID  Indication for Hospitalization: MDR UTI  Discharge Diagnoses/Problem List:  MDR UTI  Disposition: discharge home  Discharge Condition: improved  Discharge Exam:  General: NAD, pleasant, able to participate in exam Cardiac: RRR, normal heart sounds, no murmurs. 2+ radial and PT pulses bilaterally Respiratory: CTAB, normal effort, No wheezes, rales or rhonchi Abdomen: soft, nontender, nondistended, no hepatic or splenomegaly, +BS Extremities: no edema or cyanosis. WWP. Skin: warm and dry, no rashes noted GU: negative for CVA tenderness, negative for suprapubic tenderness Neuro: alert and oriented x4, no focal deficits Psych: Normal affect and mood  Brief Hospital Course:  Patient was admitted for administration of IV antibiotics as UTI was seen to be MDR on culture in outpatient setting. Patient remained stable and afebrile with some dysuria and suprapubic tenderness which completely resolved by hospital day 2. She received IV cefepime with clinical improvement and was transitioned to fosfomycin PO prior to discharge.  Issues for Follow Up:  1.  Follow up with PCP for further UTI management and careful antibiotic stewardship.  2.  Patient was referred to outpatient urology for follow-up of frequent, resistant UTIs.  Significant Procedures: na  Significant Labs and Imaging:  Recent Labs  Lab 11/03/18 1234 11/04/18 0612  WBC 9.4 7.8  HGB 13.3 12.2  HCT 39.3 36.3  PLT 475* 421*   Recent Labs  Lab 11/03/18 1234 11/04/18 0612  NA 136 134*  K 4.9 4.2  CL 104 105  CO2 27 21*  GLUCOSE 95 93  BUN 7 12  CREATININE 0.44  0.59  CALCIUM 9.7 8.7*  ALKPHOS  --  36*  AST  --  14*  ALT  --  12  ALBUMIN  --  3.5   Urinalysis    Component Value Date/Time   COLORURINE YELLOW 11/03/2018 1547   APPEARANCEUR HAZY (A) 11/03/2018 1547   LABSPEC 1.008 11/03/2018 1547   PHURINE 5.0 11/03/2018 1547   GLUCOSEU NEGATIVE 11/03/2018 1547   HGBUR SMALL (A) 11/03/2018 1547   BILIRUBINUR NEGATIVE 11/03/2018 1547   KETONESUR NEGATIVE 11/03/2018 1547   PROTEINUR NEGATIVE 11/03/2018 1547   NITRITE NEGATIVE 11/03/2018 1547   LEUKOCYTESUR SMALL (A) 11/03/2018 1547    Results/Tests Pending at Time of Discharge: urine C&S  Discharge Medications:  Allergies as of 11/04/2018   No Known Allergies     Medication List    STOP taking these medications   fluconazole 150 MG tablet Commonly known as: DIFLUCAN   ibuprofen 600 MG tablet Commonly known as: ADVIL   ondansetron 4 MG disintegrating tablet Commonly known as: Zofran ODT   oxyCODONE 5 MG immediate release tablet Commonly known as: Oxy IR/ROXICODONE   terconazole 0.8 % vaginal cream Commonly known as: Terazol 3     TAKE these medications   acetaminophen 500 MG tablet Commonly known as: TYLENOL Take 1,000 mg by mouth every 6 (six) hours as needed for mild pain.       Discharge Instructions: Please refer to Patient Instructions section of EMR for full details.  Patient was counseled important signs and symptoms that should prompt return to medical care, changes in medications, dietary instructions, activity restrictions, and follow up appointments.   Follow-Up Appointments:  Lennox Solders, MD 11/04/2018, 1:22 PM PGY-3, [facility]

## 2018-11-04 NOTE — Progress Notes (Signed)
FPTS Interim Progress Note  S: patient denies burning or pain with urination. Denies suprapubic pain. Denies nausea or vomiting. She is very ready to go home today.  O: BP (!) 91/58 (BP Location: Right Arm)   Pulse 64   Temp 98.1 F (36.7 C) (Oral)   Resp 16   Ht 5\' 3"  (1.6 m)   Wt 46.7 kg   LMP 10/10/2018   SpO2 100%   BMI 18.24 kg/m   Negative suprapubic tenderness or CVA tenderness.  A/P: UTI- improved clinically -discontinue IV Abx - ordered fosfomycin for 1100 - plan to discharge this afternoon   Richarda Osmond, DO 11/04/2018, 8:07 AM PGY-2, [place] Medicine Service pager [phone]

## 2018-11-04 NOTE — Plan of Care (Signed)

## 2018-11-04 NOTE — Progress Notes (Signed)
Patient arrived to [phone] and ambulated to the bed from the stretcher. The patient is A&O x4 and has no complaints at this time. Oriented to room and to call bell. Will continue to monitor.

## 2018-11-04 NOTE — Discharge Instructions (Signed)
You were admitted for treatment of a urinary tract infection.  You received IV antibiotics and then an antibiotic called fosfomycin this morning.  Since you felt so much better today and were adequately treated, you were felt to be safe to go home.  You need to follow-up with a urologist due to your repeated UTIs.  We have referred you to a urologist and they should be calling you soon.  Infeccin urinaria en los adultos Urinary Tract Infection, Adult  Una infeccin urinaria (IU) puede ocurrir en Clinical cytogeneticist de las vas [place]. Las vas urinarias incluyen a los riones, los urteres, la vejiga y Geologist, engineering. Estos rganos fabrican, Buyer, retail y eliminan la orina del organismo. El mdico puede usar otras palabras para describir la infeccin. La IU alta afecta los urteres y a los riones (pielonefritis). La IU baja afecta la vejiga (cistitis) y Geologist, engineering (uretritis). Cules son las causas? La mayora de las infecciones de las vas urinarias es causada por la presencia de bacterias en la zona genital, alrededor de la entrada de las vas urinarias (uretra). Estas bacterias proliferan y causan inflamacin en las vas urinarias. Qu incrementa el riesgo? Es ms probable que contraiga esta afeccin si:  Tiene colocado un catter urinario (sonda urinaria) [place].  No puede controlar cundo Production assistant, radio (tiene incontinencia).  Es mujer y usted: ? [place] espermicida o diafragma como mtodo anticonceptivo. ? Tiene niveles bajos de estrgenos. ? Estn embarazadas.  Tiene ciertos genes que aumentan su riesgo (gentica).  Es sexualmente activa.  Toma antibiticos.  Tiene una afeccin que causa que el flujo de orina sea [place], como: ? Prstata agrandada, si usted es hombre. ? Obstruccin de la uretra (estenosis). ? Clculo renal. ? Una afeccin nerviosa que afecta el control de la vejiga (vejiga neurgena). ? No bebe lo suficiente o no orina con frecuencia.  Tiene ciertas  enfermedades crnicas, como: ? Diabetes. ? Un sistema que combate las enfermedades (sistemainmunitario) debilitado. ? Anemia drepanoctica. ? Gota. ? Lesin en la mdula espinal. Cules son los signos o los sntomas? Los sntomas de esta afeccin incluyen:  Necesidad inmediata (urgente) de Garment/textile technologist.  Miccin frecuente o eliminacin de pequeas cantidades de orina con frecuencia.  Ardor o dolor al [employer].  Presencia de [employer].  Orina con mal olor u [employer]AA atpico.  Dificultad para Garment/textile technologist.  Bennie Hind turbia.  Secrecin vaginal, si es mujer.  Dolor en el abdomen o en la parte inferior de la espalda. Es posible que tambin tenga:  Vmitos o disminucin del apetito.  Confusin.  Irritabilidad o cansancio.  Cristy Hilts.  Diarrea. El Psychologist, educational sntoma en los adultos mayores puede ser la confusin. En algunos casos, es posible que no tengan sntomas hasta que la infeccin empeore. Cmo se diagnostica? Esta afeccin se diagnostica en funcin de sus antecedentes mdicos y de un examen fsico. Tambin pueden hacerle otras pruebas, incluidas las siguientes:  Anlisis de [place].  Anlisis de [place].  Pruebas de infecciones de transmisin sexual (ITS). Si ha tenido ms de una infeccin urinaria (IU), se pueden hacer estudios de diagnstico por imgenes o una cistoscopia para determinar la causa de las infecciones. Cmo se trata? El tratamiento de esta afeccin incluye lo siguiente:  Antibiticos.  Medicamentos de [place] molestias.  Beber una cantidad suficiente agua para mantenerse hidratado. Si tiene infecciones con frecuencia o tiene otras afecciones, como un clculo renal, es posible que deba ver a un mdico especialista en las vas urinarias (urlogo). En casos poco  frecuentes, las infecciones urinarias pueden provocar sepsis. La sepsis es una afeccin potencialmente mortal que se produce cuando el cuerpo responde a una infeccin. La sepsis se trata  en el hospital con antibiticos, lquidos y otros medicamentos que se administran por va intravenosa. Sigue estas instrucciones en tu casa:  Medicamentos  Toma los medicamentos de venta libre y los recetados solamente como se lo haya indicado el mdico.  Si le recetaron un antibitico, tmelo como se lo haya indicado el mdico. No deje de usar el antibitico aunque comience a Actor. Indicaciones generales  Asegrese de hacer lo siguiente: ? Vaciar la vejiga con frecuencia y en su totalidad. No contener la orina durante largos perodos. ? Vaciar la vejiga despus de [employer]. ? Limpiarse de adelante hacia atrs despus de defecar, si es mujer. Use cada trozo de papel higinico solo una vez cuando se limpie.  Beba suficiente lquido como para Pharmacologist la orina de color amarillo plido.  Concurrir a todas las visitas de [address] se lo haya indicado el mdico. Esto es importante. Comuncate con un mdico si:  Los sntomas no mejoran despus de 1 o 2das de tratamiento.  Los sntomas desaparecen y luego vuelven a Research officer, trade union. Solicite ayuda inmediatamente si tiene:  Dolor intenso en la espalda o en la parte inferior del abdomen.  Grant Ruts.  Nuseas o vmitos. Resumen  Una infeccin urinaria (IU) es una infeccin en cualquier parte de las vas urinarias, que [employer] riones, los urteres, la vejiga y Engineer, mining.  La mayora de las infecciones de las vas urinarias es causada por la presencia de bacterias en la zona genital, alrededor de la entrada de las vas urinarias (uretra).  El tratamiento de esta afeccin suele incluir antibiticos.  Si le recetaron un antibitico, tmelo como se lo haya indicado el mdico. No deje de usar el antibitico aunque comience a Actor.  Concurrir a todas las visitas de [address] se lo haya indicado el mdico. Esto es importante. Esta informacin no tiene Theme park manager el consejo del mdico. Asegrese de  hacerle al mdico cualquier pregunta que tenga. Document Released: 11/04/2004 Document Revised: 01/18/2018 Document Reviewed: 01/18/2018 Elsevier Patient Education  2020 [employer].

## 2018-11-04 NOTE — Progress Notes (Signed)
Kalman Jewels to be D/C'd  per MD order. Discussed with the patient and all questions fully answered.  VSS, Skin clean, dry and intact without evidence of skin break down, no evidence of skin tears noted.  IV catheter discontinued intact. Site without signs and symptoms of complications. Dressing and pressure applied.  An After Visit Summary was printed and given to the patient. Patient received prescription.  D/c education completed with patient/family including follow up instructions, medication list, d/c activities limitations if indicated, with other d/c instructions as indicated by MD - patient able to verbalize understanding, all questions fully answered.   Patient instructed to return to ED, call 911, or call MD for any changes in condition.   Patient to be escorted via [place], and D/C home via private auto.

## 2018-11-05 LAB — URINE CULTURE
Culture: >=100000 — AB
Special Requests: NORMAL

## 2018-11-07 LAB — RPR: RPR Ser Ql: NONREACTIVE — AB

## 2018-11-07 LAB — CERVICOVAGINAL ANCILLARY ONLY
Chlamydia: NEGATIVE
Neisseria Gonorrhea: NEGATIVE

## 2018-12-05 ENCOUNTER — Other Ambulatory Visit: Payer: Self-pay

## 2018-12-05 ENCOUNTER — Other Ambulatory Visit (HOSPITAL_COMMUNITY): Payer: Self-pay | Admitting: Obstetrics and Gynecology

## 2018-12-05 ENCOUNTER — Ambulatory Visit
Admission: RE | Admit: 2018-12-05 | Discharge: 2018-12-05 | Disposition: A | Discharge status: Home / Self Care | Payer: No Typology Code available for payment source | Source: Ambulatory Visit | Attending: Obstetrics and Gynecology | Admitting: Obstetrics and Gynecology

## 2018-12-05 DIAGNOSIS — N6320 Unspecified lump in the left breast, unspecified quadrant: Secondary | ICD-10-CM

## 2019-01-09 ENCOUNTER — Emergency Department (HOSPITAL_COMMUNITY)
Admission: EM | Admit: 2019-01-09 | Discharge: 2019-01-09 | Disposition: A | Discharge status: Home / Self Care | Payer: No Typology Code available for payment source | Attending: Emergency Medicine | Admitting: Emergency Medicine

## 2019-01-09 ENCOUNTER — Encounter (HOSPITAL_COMMUNITY): Payer: Self-pay

## 2019-01-09 DIAGNOSIS — J029 Acute pharyngitis, unspecified: Secondary | ICD-10-CM | POA: Insufficient documentation

## 2019-01-09 DIAGNOSIS — Z5321 Procedure and treatment not carried out due to patient leaving prior to being seen by health care provider: Secondary | ICD-10-CM | POA: Insufficient documentation

## 2019-01-09 MED ORDER — ACETAMINOPHEN 325 MG PO TABS
650.0000 mg | ORAL_TABLET | Freq: Once | ORAL | Status: AC | PRN
  Administered 2019-01-09: 650 mg via ORAL
  Filled 2019-01-09: qty 2

## 2019-01-09 NOTE — ED Notes (Signed)
Pt and visitor informed RN that they were leaving.  Encouraged to return should they need to.

## 2019-01-09 NOTE — ED Triage Notes (Signed)
Pt is spanish speaking only, comes for fever, sore throat and cough that has been going on all day, denies SOB, denies sick contacts

## 2019-01-22 ENCOUNTER — Other Ambulatory Visit: Payer: Self-pay

## 2019-01-22 ENCOUNTER — Other Ambulatory Visit: Payer: Self-pay | Admitting: Nurse Practitioner

## 2019-01-22 ENCOUNTER — Ambulatory Visit
Admission: RE | Admit: 2019-01-22 | Discharge: 2019-01-22 | Disposition: A | Discharge status: Home / Self Care | Payer: No Typology Code available for payment source | Source: Ambulatory Visit | Attending: Nurse Practitioner | Admitting: Nurse Practitioner

## 2019-01-22 DIAGNOSIS — M549 Dorsalgia, unspecified: Secondary | ICD-10-CM

## 2019-01-23 ENCOUNTER — Emergency Department (HOSPITAL_COMMUNITY): Payer: No Typology Code available for payment source

## 2019-01-23 ENCOUNTER — Other Ambulatory Visit: Payer: Self-pay

## 2019-01-23 ENCOUNTER — Inpatient Hospital Stay (HOSPITAL_COMMUNITY)
Admission: EM | Admit: 2019-01-23 | Discharge: 2019-02-01 | DRG: 867 | Disposition: A | Discharge status: Home / Self Care | Payer: No Typology Code available for payment source | Attending: Internal Medicine | Admitting: Internal Medicine

## 2019-01-23 DIAGNOSIS — R0902 Hypoxemia: Secondary | ICD-10-CM

## 2019-01-23 DIAGNOSIS — Z0389 Encounter for observation for other suspected diseases and conditions ruled out: Secondary | ICD-10-CM

## 2019-01-23 DIAGNOSIS — Z20828 Contact with and (suspected) exposure to other viral communicable diseases: Secondary | ICD-10-CM | POA: Diagnosis present

## 2019-01-23 DIAGNOSIS — N7091 Salpingitis, unspecified: Secondary | ICD-10-CM | POA: Diagnosis present

## 2019-01-23 DIAGNOSIS — N7011 Chronic salpingitis: Secondary | ICD-10-CM

## 2019-01-23 DIAGNOSIS — Z79899 Other long term (current) drug therapy: Secondary | ICD-10-CM

## 2019-01-23 DIAGNOSIS — Z87440 Personal history of urinary (tract) infections: Secondary | ICD-10-CM

## 2019-01-23 DIAGNOSIS — R768 Other specified abnormal immunological findings in serum: Secondary | ICD-10-CM | POA: Diagnosis present

## 2019-01-23 DIAGNOSIS — A199 Miliary tuberculosis, unspecified: Principal | ICD-10-CM | POA: Diagnosis present

## 2019-01-23 DIAGNOSIS — N390 Urinary tract infection, site not specified: Secondary | ICD-10-CM | POA: Diagnosis present

## 2019-01-23 DIAGNOSIS — D649 Anemia, unspecified: Secondary | ICD-10-CM | POA: Diagnosis present

## 2019-01-23 DIAGNOSIS — J189 Pneumonia, unspecified organism: Secondary | ICD-10-CM

## 2019-01-23 DIAGNOSIS — R188 Other ascites: Secondary | ICD-10-CM | POA: Diagnosis present

## 2019-01-23 DIAGNOSIS — I959 Hypotension, unspecified: Secondary | ICD-10-CM | POA: Diagnosis present

## 2019-01-23 DIAGNOSIS — J90 Pleural effusion, not elsewhere classified: Secondary | ICD-10-CM

## 2019-01-23 DIAGNOSIS — R509 Fever, unspecified: Secondary | ICD-10-CM | POA: Diagnosis present

## 2019-01-23 DIAGNOSIS — E876 Hypokalemia: Secondary | ICD-10-CM | POA: Diagnosis present

## 2019-01-23 DIAGNOSIS — Z9889 Other specified postprocedural states: Secondary | ICD-10-CM

## 2019-01-23 DIAGNOSIS — E43 Unspecified severe protein-calorie malnutrition: Secondary | ICD-10-CM | POA: Diagnosis present

## 2019-01-23 DIAGNOSIS — E440 Moderate protein-calorie malnutrition: Secondary | ICD-10-CM | POA: Diagnosis present

## 2019-01-23 DIAGNOSIS — Z681 Body mass index (BMI) 19 or less, adult: Secondary | ICD-10-CM

## 2019-01-23 DIAGNOSIS — D62 Acute posthemorrhagic anemia: Secondary | ICD-10-CM | POA: Diagnosis present

## 2019-01-23 LAB — CBC
HCT: 34.2 % — ABNORMAL LOW (ref 36.0–46.0)
Hemoglobin: 11.1 g/dL — ABNORMAL LOW (ref 12.0–15.0)
MCH: 27.1 pg (ref 26.0–34.0)
MCHC: 32.5 g/dL (ref 30.0–36.0)
MCV: 83.6 fL (ref 80.0–100.0)
Platelets: 743 10*3/uL — ABNORMAL HIGH (ref 150–400)
RBC: 4.09 MIL/uL (ref 3.87–5.11)
RDW: 14.4 % (ref 11.5–15.5)
WBC: 6.3 10*3/uL (ref 4.0–10.5)
nRBC: 0 % (ref 0.0–0.2)

## 2019-01-23 LAB — COMPREHENSIVE METABOLIC PANEL
ALT: 13 U/L (ref 0–44)
AST: 20 U/L (ref 15–41)
Albumin: 3.1 g/dL — ABNORMAL LOW (ref 3.5–5.0)
Alkaline Phosphatase: 43 U/L (ref 38–126)
Anion gap: 12 (ref 5–15)
BUN: 8 mg/dL (ref 6–20)
CO2: 24 mmol/L (ref 22–32)
Calcium: 8.9 mg/dL (ref 8.9–10.3)
Chloride: 101 mmol/L (ref 98–111)
Creatinine, Ser: 0.68 mg/dL (ref 0.44–1.00)
GFR calc Af Amer: >60 mL/min (ref >60)
GFR calc non Af Amer: >60 mL/min (ref >60)
Glucose, Bld: 93 mg/dL (ref 70–99)
Potassium: 3.5 mmol/L (ref 3.5–5.1)
Sodium: 137 mmol/L (ref 135–145)
Total Bilirubin: 0.4 mg/dL (ref 0.3–1.2)
Total Protein: 8.3 g/dL — ABNORMAL HIGH (ref 6.5–8.1)

## 2019-01-23 LAB — TROPONIN I (HIGH SENSITIVITY)
Troponin I (High Sensitivity): <2 ng/L (ref <18)
Troponin I (High Sensitivity): <2 ng/L (ref <18)

## 2019-01-23 LAB — I-STAT BETA HCG BLOOD, ED (MC, WL, AP ONLY): I-stat hCG, quantitative: <5 m[IU]/mL (ref <5)

## 2019-01-23 LAB — LIPASE, BLOOD: Lipase: 42 U/L (ref 11–51)

## 2019-01-23 NOTE — ED Triage Notes (Signed)
Spanish interpreter used for triage:  Pt reports 1 week of chest pain that radiates to her back, pain worse with movement. Pt a.o, nad noted.

## 2019-01-24 ENCOUNTER — Encounter (HOSPITAL_COMMUNITY): Payer: Self-pay | Admitting: Radiology

## 2019-01-24 ENCOUNTER — Emergency Department (HOSPITAL_COMMUNITY): Payer: Self-pay

## 2019-01-24 DIAGNOSIS — J90 Pleural effusion, not elsewhere classified: Secondary | ICD-10-CM

## 2019-01-24 DIAGNOSIS — R188 Other ascites: Secondary | ICD-10-CM | POA: Diagnosis present

## 2019-01-24 LAB — CBC WITH DIFFERENTIAL/PLATELET
Abs Immature Granulocytes: 0.02 10*3/uL (ref 0.00–0.07)
Basophils Absolute: 0 10*3/uL (ref 0.0–0.1)
Basophils Relative: 1 %
Eosinophils Absolute: 0.1 10*3/uL (ref 0.0–0.5)
Eosinophils Relative: 1 %
HCT: 32.4 % — ABNORMAL LOW (ref 36.0–46.0)
Hemoglobin: 10.7 g/dL — ABNORMAL LOW (ref 12.0–15.0)
Immature Granulocytes: 0 %
Lymphocytes Relative: 18 %
Lymphs Abs: 1.2 10*3/uL (ref 0.7–4.0)
MCH: 27.3 pg (ref 26.0–34.0)
MCHC: 33 g/dL (ref 30.0–36.0)
MCV: 82.7 fL (ref 80.0–100.0)
Monocytes Absolute: 0.6 10*3/uL (ref 0.1–1.0)
Monocytes Relative: 10 %
Neutro Abs: 4.5 10*3/uL (ref 1.7–7.7)
Neutrophils Relative %: 70 %
Platelets: 645 10*3/uL — ABNORMAL HIGH (ref 150–400)
RBC: 3.92 MIL/uL (ref 3.87–5.11)
RDW: 14.3 % (ref 11.5–15.5)
WBC: 6.5 10*3/uL (ref 4.0–10.5)
nRBC: 0 % (ref 0.0–0.2)

## 2019-01-24 LAB — COMPREHENSIVE METABOLIC PANEL
ALT: 11 U/L (ref 0–44)
AST: 18 U/L (ref 15–41)
Albumin: 3 g/dL — ABNORMAL LOW (ref 3.5–5.0)
Alkaline Phosphatase: 39 U/L (ref 38–126)
Anion gap: 13 (ref 5–15)
BUN: 8 mg/dL (ref 6–20)
CO2: 22 mmol/L (ref 22–32)
Calcium: 8.6 mg/dL — ABNORMAL LOW (ref 8.9–10.3)
Chloride: 101 mmol/L (ref 98–111)
Creatinine, Ser: 0.69 mg/dL (ref 0.44–1.00)
GFR calc Af Amer: >60 mL/min (ref >60)
GFR calc non Af Amer: >60 mL/min (ref >60)
Glucose, Bld: 82 mg/dL (ref 70–99)
Potassium: 3.2 mmol/L — ABNORMAL LOW (ref 3.5–5.1)
Sodium: 136 mmol/L (ref 135–145)
Total Bilirubin: 0.6 mg/dL (ref 0.3–1.2)
Total Protein: 7.7 g/dL (ref 6.5–8.1)

## 2019-01-24 LAB — RAPID HIV SCREEN (HIV 1/2 AB+AG)
HIV 1/2 Antibodies: NONREACTIVE
HIV-1 P24 Antigen - HIV24: NONREACTIVE

## 2019-01-24 LAB — PROCALCITONIN: Procalcitonin: <0.1 ng/mL

## 2019-01-24 LAB — URINALYSIS, ROUTINE W REFLEX MICROSCOPIC
Bilirubin Urine: NEGATIVE
Glucose, UA: NEGATIVE mg/dL
Ketones, ur: 5 mg/dL — AB
Leukocytes,Ua: NEGATIVE
Nitrite: NEGATIVE
Protein, ur: NEGATIVE mg/dL
Specific Gravity, Urine: 1.02 (ref 1.005–1.030)
pH: 5 (ref 5.0–8.0)

## 2019-01-24 LAB — RPR
RPR Ser Ql: REACTIVE — AB
RPR Titer: 1:1 {titer}

## 2019-01-24 LAB — SARS CORONAVIRUS 2 (TAT 6-24 HRS): SARS Coronavirus 2: NEGATIVE

## 2019-01-24 MED ORDER — HYDROCODONE-ACETAMINOPHEN 5-325 MG PO TABS
1.0000 | ORAL_TABLET | ORAL | Status: DC | PRN
  Administered 2019-01-24 – 2019-01-26 (×7): 2 via ORAL
  Administered 2019-01-30 – 2019-01-31 (×4): 1 via ORAL
  Administered 2019-02-01: 2 via ORAL
  Filled 2019-01-24 (×4): qty 2
  Filled 2019-01-24 (×2): qty 1
  Filled 2019-01-24: qty 2
  Filled 2019-01-24: qty 1
  Filled 2019-01-24: qty 2
  Filled 2019-01-24: qty 1
  Filled 2019-01-24 (×2): qty 2
  Filled 2019-01-24: qty 1
  Filled 2019-01-24: qty 2
  Filled 2019-01-24: qty 1

## 2019-01-24 MED ORDER — ONDANSETRON HCL 4 MG/2ML IJ SOLN: 4.0000 mg | Freq: Four times a day (QID) | INTRAMUSCULAR | Status: DC | PRN

## 2019-01-24 MED ORDER — POTASSIUM CHLORIDE CRYS ER 20 MEQ PO TBCR
40.0000 meq | EXTENDED_RELEASE_TABLET | Freq: Every day | ORAL | Status: DC
  Administered 2019-01-24 – 2019-01-26 (×3): 40 meq via ORAL
  Filled 2019-01-24 (×3): qty 2

## 2019-01-24 MED ORDER — ACETAMINOPHEN 325 MG PO TABS
650.0000 mg | ORAL_TABLET | Freq: Four times a day (QID) | ORAL | Status: DC | PRN
  Administered 2019-01-26 – 2019-01-31 (×7): 650 mg via ORAL
  Filled 2019-01-24 (×7): qty 2

## 2019-01-24 MED ORDER — MORPHINE SULFATE (PF) 4 MG/ML IV SOLN
4.0000 mg | INTRAVENOUS | Status: DC | PRN
  Administered 2019-01-24: 4 mg via INTRAVENOUS
  Filled 2019-01-24: qty 1

## 2019-01-24 MED ORDER — MORPHINE SULFATE (PF) 2 MG/ML IV SOLN
1.0000 mg | INTRAVENOUS | Status: DC | PRN
  Administered 2019-01-25: 1 mg via INTRAVENOUS
  Filled 2019-01-24: qty 1

## 2019-01-24 MED ORDER — ACETAMINOPHEN 650 MG RE SUPP
650.0000 mg | Freq: Four times a day (QID) | RECTAL | Status: DC | PRN
  Administered 2019-01-27: 650 mg via RECTAL
  Filled 2019-01-24 (×2): qty 1

## 2019-01-24 MED ORDER — POTASSIUM CHLORIDE IN NACL 20-0.45 MEQ/L-% IV SOLN
INTRAVENOUS | Status: DC
  Filled 2019-01-24 (×5): qty 1000

## 2019-01-24 MED ORDER — ONDANSETRON HCL 4 MG PO TABS: 4.0000 mg | ORAL_TABLET | Freq: Four times a day (QID) | ORAL | Status: DC | PRN

## 2019-01-24 MED ORDER — IOHEXOL 300 MG/ML  SOLN
80.0000 mL | Freq: Once | INTRAMUSCULAR | Status: AC | PRN
  Administered 2019-01-24: 80 mL via INTRAVENOUS

## 2019-01-24 MED ORDER — IOHEXOL 300 MG/ML  SOLN
75.0000 mL | Freq: Once | INTRAMUSCULAR | Status: AC | PRN
  Administered 2019-01-24: 01:00:00 75 mL via INTRAVENOUS

## 2019-01-24 MED ORDER — HYDROCODONE-ACETAMINOPHEN 5-325 MG PO TABS: 1.0000 | ORAL_TABLET | ORAL | Status: DC | PRN

## 2019-01-24 MED ORDER — POTASSIUM CHLORIDE IN NACL 20-0.45 MEQ/L-% IV SOLN
INTRAVENOUS | Status: AC
  Filled 2019-01-24: qty 1000

## 2019-01-24 NOTE — ED Notes (Signed)
Pt aware we need urine specimen.  

## 2019-01-24 NOTE — ED Provider Notes (Signed)
[facility] EMERGENCY DEPARTMENT Provider Note   CSN: [id number] Arrival date & time: 01/23/19  1610     History Chief Complaint  Patient presents with  . Chest Pain    Nancy Hurst is a 28 y.o. female.  The history is provided by the patient and medical records.  Chest Pain Associated symptoms: cough    28 year old female here with cough and chest pain that has been radiating into her back.  She was seen at urgent care yesterday, had a chest x-ray performed that was concerning for pleural effusion versus pneumonia and was started on course of doxycycline.  She took 1 dose of this thus far but did not feel any better so she came to the ED.  She denies any sick contacts.  No known Covid exposures.  She does report some abdominal discomfort, unsure if this is from coughing.  She is not had any vomiting or diarrhea.  No urinary symptoms.  She reports some subjective fever and chills.  History reviewed. No pertinent past medical history.  Patient Active Problem List   Diagnosis Date Noted  . Recurrent UTI   . Perforated appendicitis 11/03/2017    Past Surgical History:  Procedure Laterality Date  . BREAST SURGERY     left breast  . LAPAROSCOPIC APPENDECTOMY N/A 11/03/2017   Procedure: APPENDECTOMY LAPAROSCOPIC;  Surgeon: Almond Lint, MD;  Location: [facility];  Service: General;  Laterality: N/A;     OB History    Gravida  0   Para  0   Term  0   Preterm  0   AB  0   Living  0     SAB  0   TAB  0   Ectopic  0   Multiple  0   Live Births  0           No family history on file.  Social History   Tobacco Use  . Smoking status: Never Smoker  . Smokeless tobacco: Never Used  Substance Use Topics  . Alcohol use: No  . Drug use: No    Home Medications Prior to Admission medications   Medication Sig Start Date End Date Taking? Authorizing Provider  acetaminophen (TYLENOL) 500 MG tablet Take 1,000 mg by mouth every 6  (six) hours as needed for mild pain.    [provider]    Allergies    Patient has no known allergies.  Review of Systems   Review of Systems  Respiratory: Positive for cough.   Cardiovascular: Positive for chest pain.  All other systems reviewed and are negative.   Physical Exam Updated Vital Signs BP 110/80   Pulse 91   Temp 99.5 F (37.5 C) (Oral)   Resp 16   LMP 01/09/2019   SpO2 100%   Physical Exam Vitals and nursing note reviewed.  Constitutional:      Appearance: She is well-developed.     Comments: Well appearing, non-toxic  HENT:     Head: Normocephalic and atraumatic.  Eyes:     Conjunctiva/sclera: Conjunctivae normal.     Pupils: Pupils are equal, round, and reactive to light.  Cardiovascular:     Rate and Rhythm: Normal rate and regular rhythm.     Heart sounds: Normal heart sounds.  Pulmonary:     Effort: Pulmonary effort is normal.     Breath sounds: Normal breath sounds.     Comments: Breath sounds are coarse and junky on the left,  no respiratory distress Abdominal:     General: Bowel sounds are normal. There is distension.     Palpations: Abdomen is soft.     Comments: distended  Musculoskeletal:        General: Normal range of motion.     Cervical back: Normal range of motion.  Skin:    General: Skin is warm and dry.  Neurological:     Mental Status: She is alert and oriented to person, place, and time.     ED Results / Procedures / Treatments   Labs (all labs ordered are listed, but only abnormal results are displayed) Labs Reviewed  CBC - Abnormal; Notable for the following components:      Result Value   Hemoglobin 11.1 (*)    HCT 34.2 (*)    Platelets 743 (*)    All other components within normal limits  COMPREHENSIVE METABOLIC PANEL - Abnormal; Notable for the following components:   Total Protein 8.3 (*)    Albumin 3.1 (*)    All other components within normal limits  URINALYSIS, ROUTINE W REFLEX MICROSCOPIC -  Abnormal; Notable for the following components:   APPearance HAZY (*)    Hgb urine dipstick SMALL (*)    Ketones, ur 5 (*)    Bacteria, UA RARE (*)    All other components within normal limits  CULTURE, BLOOD (ROUTINE X 2)  CULTURE, BLOOD (ROUTINE X 2)  EXPECTORATED SPUTUM ASSESSMENT W REFEX TO RESP CULTURE  LIPASE, BLOOD  RAPID HIV SCREEN (HIV 1/2 AB+AG)  RPR  QUANTIFERON-TB GOLD PLUS  I-STAT BETA HCG BLOOD, ED ([facility], [facility], [facility] ONLY)  TROPONIN I (HIGH SENSITIVITY)  TROPONIN I (HIGH SENSITIVITY)    EKG None  Radiology DG Chest 2 View  Result Date: 01/23/2019 CLINICAL DATA:  Chest pain EXAM: CHEST - 2 VIEW COMPARISON:  January 22, 2019 FINDINGS: There is a persistent left pleural effusion. There is persistent scarring in the upper lobe regions with ill-defined opacity in the left apex, stable. No new opacity evident. Heart size and pulmonary vascularity are normal. No adenopathy appreciable. No bone lesions. IMPRESSION: Persistent left pleural effusion. Scarring in the upper lobe regions with asymmetric opacity in the left upper lobe toward the apex. Question focal pneumonia in this area. Given the asymmetry in the left apex compared to the right, correlation with chest CT, ideally with intravenous contrast, may be advised to further evaluate. Heart size normal.  No adenopathy evident by radiography. Electronically Signed   By: Bretta Bang III M.D.   On: 01/23/2019 17:05   DG Chest 2 View  Result Date: 01/22/2019 CLINICAL DATA:  Back pain, cough EXAM: CHEST - 2 VIEW COMPARISON:  None. FINDINGS: The heart size and mediastinal contours are within normal limits. Moderate left pleural effusion and associated atelectasis or consolidation. The visualized skeletal structures are unremarkable. IMPRESSION: Moderate left pleural effusion and associated atelectasis or consolidation. Electronically Signed   By: Lauralyn Primes M.D.   On: 01/22/2019 13:12   CT Chest W Contrast  Result Date:  01/24/2019 CLINICAL DATA:  Pleural effusion. Cough. EXAM: CT CHEST WITH CONTRAST TECHNIQUE: Multidetector CT imaging of the chest was performed during intravenous contrast administration. CONTRAST:  75mL OMNIPAQUE IOHEXOL 300 MG/ML  SOLN COMPARISON:  CT of the pelvis dated 11/03/2017. Chest x-ray from same day. FINDINGS: Cardiovascular: There is no large centrally located pulmonary embolism. Detection of smaller pulmonary emboli is limited by technique and contrast timing. The heart size is normal. There is a  trace pericardial effusion. There is no thoracic aortic aneurysm or dissection. Mediastinum/Nodes: There is partial collapse of the left upper lobe. There is bilateral upper lobe bronchiectasis. --there are slightly prominent hilar and mediastinal lymph nodes, most notably involving the left hilum. --No axillary lymphadenopathy. --No supraclavicular lymphadenopathy. --Normal thyroid gland. --The esophagus is unremarkable Lungs/Pleura: There is a large likely loculated left-sided pleural effusion. There is bronchiectasis and architectural distortion of the bilateral upper lobes. There are areas of calcification within the bilateral upper lobes. There are pulmonary nodules bilaterally measuring up to approximately 1.4 cm. There is consolidation in the bilateral upper lobes. There appear to be bilateral micro nodules there is no pneumothorax. Upper Abdomen: The upper abdomen demonstrates a moderate to large volume of abdominal ascites. There appears to be infiltration of the omentum with possible thickening of the peritoneal lining. There are possible calcified lymph nodes in the upper abdomen. Musculoskeletal: No chest wall abnormality. No acute or significant osseous findings. Review of the MIP images confirms the above findings. IMPRESSION: 1. There is no large centrally located pulmonary embolism. Detection of smaller pulmonary emboli is limited by technique and contrast timing. 2. Large likely loculated  left-sided pleural effusion. 3. Sequela of findings in the lungs as detailed above that favor an atypical infectious process such as advanced tuberculosis. Correlation with laboratory studies is recommended. Follow-up is recommended given the presence of pulmonary nodules as detailed above. 4. Moderate to large volume of abdominal ascites with infiltration of the omentum with possible thickening of the peritoneal lining. Findings raise concern for a process such as TB peritonitis in the appropriate clinical setting. Follow-up with a contrast-enhanced CT of the abdomen is recommended for further evaluation. 5. Trace pericardial effusion. These results were called by telephone at the time of interpretation on 01/24/2019 at 2:09 am to provider [facility] , who verbally acknowledged these results. Electronically Signed   By: Katherine Mantle M.D.   On: 01/24/2019 02:10   CT ABDOMEN PELVIS W CONTRAST  Result Date: 01/24/2019 CLINICAL DATA:  Peritonitis EXAM: CT ABDOMEN AND PELVIS WITH CONTRAST TECHNIQUE: Multidetector CT imaging of the abdomen and pelvis was performed using the standard protocol following bolus administration of intravenous contrast. CONTRAST:  55mL OMNIPAQUE IOHEXOL 300 MG/ML  SOLN COMPARISON:  None. FINDINGS: Lower chest: Again noted is a large loculated left-sided pleural effusion with some pleural thickening at the lung base. Hepatobiliary: The liver is normal. Normal gallbladder.There is no biliary ductal dilation. Pancreas: Normal contours without ductal dilatation. No peripancreatic fluid collection. Spleen: No splenic laceration or hematoma. Adrenals/Urinary Tract: --Adrenal glands: No adrenal hemorrhage. --Right kidney/ureter: The appearance of the kidney is slightly striated which may be secondary to back to back contrast-enhanced studies. --Left kidney/ureter: The appearance of the kidney is striated which may be secondary to back to back contrast-enhanced studies. --Urinary bladder:  Unremarkable. Stomach/Bowel: --Stomach/Duodenum: There is some wall thickening of the distal gastric body and gastric antrum. --Small bowel: There are slightly dilated loops of small bowel scattered throughout the abdomen. Many of the small bowel loops appear somewhat matted. --Colon: No focal abnormality. --Appendix: Surgically absent. Vascular/Lymphatic: Normal course and caliber of the major abdominal vessels. --there are no pathologically enlarged retroperitoneal lymph nodes. There are few mildly enlarged but subcentimeter lymph nodes noted in the retroperitoneal space. --there are multiple enlarged low-attenuation mesenteric lymph nodes. These are best appreciated on the coronal series. The largest measures approximately 1.4 cm. --there are enlarged pelvic lymph nodes. For example there is a 1.1 cm  right pelvic sidewall lymph node (axial series 3, image 68). Reproductive: On the coronal series, there is a fluid-filled somewhat serpiginous structure in the patient's left hemipelvis. There is amorphous soft tissue within the patient's pelvis. The ovaries are somewhat indistinct. The uterus is unremarkable. Other: There is a moderate to large volume of free fluid in the patient's abdomen. This free fluid measures above water density, however this measurement may not be reliable as the gallbladder also measures above water density. There is diffuse caking of the omentum. There is edema within the mesentery and thickening of the peritoneal lining. Musculoskeletal. There is no acute displaced fracture. No dislocation IMPRESSION: 1. Multiple findings as detailed above favor a diagnosis of TB peritonitis versus less likely peritoneal carcinomatosis given the findings in the lungs. The findings include a moderate to large volume of free fluid, omental caking, and enlarged likely caseating lymph nodes among other findings. Follow-up is recommended. 2. Moderate to large volume somewhat hyperdense free fluid in the  abdomen. This is amenable to percutaneous sampling. 3. Possible wall thickening of the gastric antrum. Findings may be secondary to GI involvement of TB. 4. Serpiginous cystic appearing structure in the patient's left hemipelvis raises concern for left-sided hydrosalpinx or pyosalpinx. This can be further evaluated with a pelvic ultrasound. 5. Amorphous soft tissue in the patient's pelvis with indistinct ovaries. Attention on follow-up examinations is recommended and/or follow-up with pelvic ultrasound is recommended. 6. Again noted is a large likely loculated left-sided pleural effusion with pleural thickening at the left lung base. This is amenable to percutaneous sampling as clinically indicated. These results were called by telephone at the time of interpretation on 01/24/2019 at 3:32 am to [facility] , who verbally acknowledged these results. Electronically Signed   By: Katherine Mantlehristopher  Green M.D.   On: 01/24/2019 03:33    Procedures Procedures (including critical care time)  CRITICAL CARE Performed by: Garlon HatchetLisa M Meya Clutter   Total critical care time: 45 minutes  Critical care time was exclusive of separately billable procedures and treating other patients.  Critical care was necessary to treat or prevent imminent or life-threatening deterioration.  Critical care was time spent personally by me on the following activities: development of treatment plan with patient and/or surrogate as well as nursing, discussions with consultants, evaluation of patient's response to treatment, examination of patient, obtaining history from patient or surrogate, ordering and performing treatments and interventions, ordering and review of laboratory studies, ordering and review of radiographic studies, pulse oximetry and re-evaluation of patient's condition.   Medications Ordered in ED Medications  iohexol (OMNIPAQUE) 300 MG/ML solution 75 mL (75 mLs Intravenous Contrast Given 01/24/19 0112)  iohexol  (OMNIPAQUE) 300 MG/ML solution 80 mL (80 mLs Intravenous Contrast Given 01/24/19 0234)    ED Course  I have reviewed the triage vital signs and the nursing notes.  Pertinent labs & imaging results that were available during my care of the patient were reviewed by me and considered in my medical decision making (see chart for details).    MDM Rules/Calculators/A&P  28 year old female here with cough, chest pain radiating to the back, as well as some vague abdominal discomfort.    She was seen at urgent care yesterday with abnormal chest x-ray and started on doxycycline.  Has taken 1 dose thus far.  Reports persistent symptoms.  She has a low-grade fever here but is overall nontoxic in appearance.  Breath sounds are junky on the left but she is not have any signs of  respiratory distress and is maintaining proper saturations on room air.  Abdomen is mildly distended but she is very thin.  Denies any vomiting or diarrhea.  No urinary symptoms.  Chest x-ray here again with noted left-sided pleural effusion plus or minus infiltrate, recommended chest CT for further evaluation which has been ordered.  Her labs are overall reassuring.  2:25 AM CT with concern of disseminated TB.  Discussed CT results with patient.  She does admit for the past month she has been having sweats, worse at night, as well as ongoing cough.  She is not had any sick contacts.  She has been in the [place] for approximately 2 years.  She does report she is fully vaccinated.  She reports history of typhoid fever at age of 6.  She lives in a house with 6 other individuals, all Hispanic.  None of them have been sick with similar symptoms.  We will plan to obtain CT abdomen pelvis as recommended by radiology as well as QuantiFERON labs, HIV, RPR, and blood cultures.  Will discuss with hospitalist for admission.  Will moved to negative pressure room and placed on airborne precautions.  Nursing staff notified of her results as well to use  precaution during patient care.  Discussed with Dr. Myna Hidalgo-- will admit for ongoing care.  3:38 AM Discussed CT [facility] findings with radiology-- multiple findings as noted above, given concurrent lung findings still favored to be disseminated TB, less likely peritoneal carcinomatosis.  There is also some abnormalities noted in the pelvis, seen on prior CT's as well of undetermined significance.  Can follow-up with pelvic US.  Paracentesis and thoracentesis are recommended for evaluation of possible cancerous pathology.  Admitting physician was updated on these findings and has placed orders.  Final Clinical Impression(s) / ED Diagnoses Final diagnoses:  Disseminated tuberculosis    Rx / DC Orders ED Discharge Orders    None       Larene Pickett, PA-C 01/24/19 0342    Merrily Pew, MD 01/24/19 0730

## 2019-01-24 NOTE — ED Notes (Signed)
Transported pt with tech assistance to CT

## 2019-01-24 NOTE — H&P (Signed)
History and Physical    Nancy StairsMaria Dalid Hurst Hurst ZOX:[id number] DOB: 02/06/1991 DOA: 01/23/2019  PCP: Patient, No Pcp Per   Patient coming from: Home   Chief Complaint: Chest pain, abdominal pain and swelling   Spanish language medical interpreter was used.   HPI: Nancy GuntherMaria Dalid Tawni MillersHernandez Hurst is a 28 y.o. female with medical history significant for ESBL UTI 3 months ago, reporting that she is typically in good health, now presenting to the emergency department for evaluation of pleuritic chest pain, abdominal pain and distention, and mild nonproductive cough.  Patient reports that the symptoms developed insidiously roughly 2 weeks ago and have steadily worsened.  She had been experiencing night sweats and loss of appetite going back a little bit longer than this.  The pain is mainly in her right chest, worse with cough, and she has also had some less intense pain about the mid abdomen without vomiting or diarrhea.  She has not noticed any fevers.  She denies any significant alcohol use, history of incarceration or homelessness, or history of tuberculosis.  [place], lives with 6 other people, but does not know of any sick contacts.  She was evaluated for these complaints at an urgent care on 01/22/2019, was noted to have a left-sided pleural effusion on chest x-ray with possible consolidation, and was prescribed doxycycline.  She continued to feel poorly and came into the ED for further evaluation.  ED Course: Upon arrival to the ED, patient is found to be afebrile, saturating mid 90s on room air, slightly tachycardic, and with stable blood pressure.  EKG features sinus rhythm.  CT chest with contrast is negative for large central PE but concerning for large likely loculated left pleural effusion, pulmonary nodules bilaterally, as well as bronchiectasis, calcifications, and consolidation involving the bilateral upper lobes.  CT of the abdomen and pelvis is concerning  for ascites, omental caking, and enlarged caseating lymph nodes.  Chemistry panel features a slightly low albumin and elevated protein.  CBC notable for mild normocytic anemia and thrombocytosis with platelets 7 and 43,000.  Blood cultures, HIV, RPR, and QuantiFERON ordered from the emergency department and not yet collected.  Patient was placed on airborne precautions and hospitalist consulted for admission.  Review of Systems:  All other systems reviewed and apart from HPI, are negative.  History reviewed. No pertinent past medical history.  Past Surgical History:  Procedure Laterality Date  . BREAST SURGERY     left breast  . LAPAROSCOPIC APPENDECTOMY N/A 11/03/2017   Procedure: APPENDECTOMY LAPAROSCOPIC;  Surgeon: Almond LintByerly, Faera, MD;  Location: [facility];  Service: General;  Laterality: N/A;     reports that she has never smoked. She has never used smokeless tobacco. She reports that she does not drink alcohol or use drugs.  No Known Allergies  History reviewed. No pertinent family history.   Prior to Admission medications   Medication Sig Start Date End Date Taking? Authorizing Provider  acetaminophen (TYLENOL) 500 MG tablet Take 1,000 mg by mouth every 6 (six) hours as needed for mild pain.    [provider]    Physical Exam: Vitals:   01/23/19 1919 01/23/19 2305 01/24/19 0043 01/24/19 0300  BP: 106/72 101/71 110/80 109/79  Pulse: 89 86 91 86  Resp: 16 16  (!) 25  Temp:  99.5 F (37.5 C)    TempSrc:  Oral    SpO2: 100% 96% 100% 100%    Constitutional: NAD, calm  Eyes: PERTLA, lids and conjunctivae normal ENMT: Mucous membranes are moist. Posterior pharynx clear of any exudate or lesions.   Neck: normal, supple, no masses, no thyromegaly Respiratory: Diminished on right.  No wheezing. Normal respiratory effort.     Cardiovascular: S1 & S2 heard, regular rate and rhythm. No extremity edema.    Abdomen: Distended, generalized tenderness, no rebound pain or  guarding. Bowel sounds active.  Musculoskeletal: no clubbing / cyanosis. No joint deformity upper and lower extremities.    Skin: no significant rashes, lesions, ulcers. Warm, dry, well-perfused. Neurologic: No facial asymmetry. Sensation intact. Moving all extremities.  Psychiatric: Alert and oriented to person, place, and situation. Pleasant, cooperative.     Labs on Admission: I have personally reviewed following labs and imaging studies  CBC: Recent Labs  Lab 01/23/19 1635  WBC 6.3  HGB 11.1*  HCT 34.2*  MCV 83.6  PLT 743*   Basic Metabolic Panel: Recent Labs  Lab 01/23/19 1635  NA 137  K 3.5  CL 101  CO2 24  GLUCOSE 93  BUN 8  CREATININE 0.68  CALCIUM 8.9   GFR: CrCl cannot be calculated (Unknown ideal weight.). Liver Function Tests: Recent Labs  Lab 01/23/19 1635  AST 20  ALT 13  ALKPHOS 43  BILITOT 0.4  PROT 8.3*  ALBUMIN 3.1*   Recent Labs  Lab 01/23/19 1635  LIPASE 42   No results for input(s): AMMONIA in the last 168 hours. Coagulation Profile: No results for input(s): INR, PROTIME in the last 168 hours. Cardiac Enzymes: No results for input(s): CKTOTAL, CKMB, CKMBINDEX, TROPONINI in the last 168 hours. BNP (last 3 results) No results for input(s): PROBNP in the last 8760 hours. HbA1C: No results for input(s): HGBA1C in the last 72 hours. CBG: No results for input(s): GLUCAP in the last 168 hours. Lipid Profile: No results for input(s): CHOL, HDL, LDLCALC, TRIG, CHOLHDL, LDLDIRECT in the last 72 hours. Thyroid Function Tests: No results for input(s): TSH, T4TOTAL, FREET4, T3FREE, THYROIDAB in the last 72 hours. Anemia Panel: No results for input(s): VITAMINB12, FOLATE, FERRITIN, TIBC, IRON, RETICCTPCT in the last 72 hours. Urine analysis:    Component Value Date/Time   COLORURINE YELLOW 01/24/2019 0038   APPEARANCEUR HAZY (A) 01/24/2019 0038   LABSPEC 1.020 01/24/2019 0038   PHURINE 5.0 01/24/2019 0038   GLUCOSEU NEGATIVE 01/24/2019  0038   HGBUR SMALL (A) 01/24/2019 0038   BILIRUBINUR NEGATIVE 01/24/2019 0038   KETONESUR 5 (A) 01/24/2019 0038   PROTEINUR NEGATIVE 01/24/2019 0038   NITRITE NEGATIVE 01/24/2019 0038   LEUKOCYTESUR NEGATIVE 01/24/2019 0038   Sepsis Labs: @LABRCNTIP (procalcitonin:4,lacticidven:4) )No results found for this or any previous visit (from the past 240 hour(s)).   Radiological Exams on Admission: DG Chest 2 View  Result Date: 01/23/2019 CLINICAL DATA:  Chest pain EXAM: CHEST - 2 VIEW COMPARISON:  January 22, 2019 FINDINGS: There is a persistent left pleural effusion. There is persistent scarring in the upper lobe regions with ill-defined opacity in the left apex, stable. No new opacity evident. Heart size and pulmonary vascularity are normal. No adenopathy appreciable. No bone lesions. IMPRESSION: Persistent left pleural effusion. Scarring in the upper lobe regions with asymmetric opacity in the left upper lobe toward the apex. Question focal pneumonia in this area. Given the asymmetry in the left apex compared to the right, correlation with chest CT, ideally with intravenous contrast, may be advised to further evaluate. Heart size normal.  No adenopathy evident by radiography. Electronically Signed   By:  Lowella Grip III M.D.   On: 01/23/2019 17:05   DG Chest 2 View  Result Date: 01/22/2019 CLINICAL DATA:  Back pain, cough EXAM: CHEST - 2 VIEW COMPARISON:  None. FINDINGS: The heart size and mediastinal contours are within normal limits. Moderate left pleural effusion and associated atelectasis or consolidation. The visualized skeletal structures are unremarkable. IMPRESSION: Moderate left pleural effusion and associated atelectasis or consolidation. Electronically Signed   By: Eddie Candle M.D.   On: 01/22/2019 13:12   CT Chest W Contrast  Result Date: 01/24/2019 CLINICAL DATA:  Pleural effusion. Cough. EXAM: CT CHEST WITH CONTRAST TECHNIQUE: Multidetector CT imaging of the chest was  performed during intravenous contrast administration. CONTRAST:  16mL OMNIPAQUE IOHEXOL 300 MG/ML  SOLN COMPARISON:  CT of the pelvis dated 11/03/2017. Chest x-ray from same day. FINDINGS: Cardiovascular: There is no large centrally located pulmonary embolism. Detection of smaller pulmonary emboli is limited by technique and contrast timing. The heart size is normal. There is a trace pericardial effusion. There is no thoracic aortic aneurysm or dissection. Mediastinum/Nodes: There is partial collapse of the left upper lobe. There is bilateral upper lobe bronchiectasis. --there are slightly prominent hilar and mediastinal lymph nodes, most notably involving the left hilum. --No axillary lymphadenopathy. --No supraclavicular lymphadenopathy. --Normal thyroid gland. --The esophagus is unremarkable Lungs/Pleura: There is a large likely loculated left-sided pleural effusion. There is bronchiectasis and architectural distortion of the bilateral upper lobes. There are areas of calcification within the bilateral upper lobes. There are pulmonary nodules bilaterally measuring up to approximately 1.4 cm. There is consolidation in the bilateral upper lobes. There appear to be bilateral micro nodules there is no pneumothorax. Upper Abdomen: The upper abdomen demonstrates a moderate to large volume of abdominal ascites. There appears to be infiltration of the omentum with possible thickening of the peritoneal lining. There are possible calcified lymph nodes in the upper abdomen. Musculoskeletal: No chest wall abnormality. No acute or significant osseous findings. Review of the MIP images confirms the above findings. IMPRESSION: 1. There is no large centrally located pulmonary embolism. Detection of smaller pulmonary emboli is limited by technique and contrast timing. 2. Large likely loculated left-sided pleural effusion. 3. Sequela of findings in the lungs as detailed above that favor an atypical infectious process such as  advanced tuberculosis. Correlation with laboratory studies is recommended. Follow-up is recommended given the presence of pulmonary nodules as detailed above. 4. Moderate to large volume of abdominal ascites with infiltration of the omentum with possible thickening of the peritoneal lining. Findings raise concern for a process such as TB peritonitis in the appropriate clinical setting. Follow-up with a contrast-enhanced CT of the abdomen is recommended for further evaluation. 5. Trace pericardial effusion. These results were called by telephone at the time of interpretation on 01/24/2019 at 2:09 am to provider [facility] , who verbally acknowledged these results. Electronically Signed   By: Constance Holster M.D.   On: 01/24/2019 02:10    EKG: Independently reviewed. Sinus rhythm, rate 98, QTc 408 ms.   Assessment/Plan   1. Loculated pleural effusion; pulmonary nodules  - Presents with pleuritic chest pain and non-productive cough with ROS positive for night sweats and anorexia, and is found to have a constellation of findings on chest CT that raise concern for tuberculosis  - She was placed in negative pressure room in ED and blood cultures, quantiferon, and rapid HIV were ordered  - Continue airborne precautions, check AFB sputum smears and culture, consult IR for thoracentesis  with fluid analysis   2. Abdominal ascites  - CT abd/pelvis concerning for fluid, omental caking, and adenopathy  - DDx includes carcinomatosis, but in light of pulmonary findings, radiology favors tuberculosis  - Consult IR for diagnostic paracentesis     DVT prophylaxis: SCD's  Code Status: Full  Family Communication: Discussed with patient  Consults called: None  Admission status: Inpatient    Briscoe Deutscher, MD [facility] Pager [phone]  If 7PM-7AM, please contact night-coverage [email or web address] Password [email or web address]  01/24/2019, 3:31 AM

## 2019-01-24 NOTE — ED Notes (Signed)
Pt transported to CT ?

## 2019-01-24 NOTE — Progress Notes (Signed)
Patient seen and examined and agree with plan of care as per my partner who admitted this patient this morning  28 year old [place] lady moved here 2 years ago from [place] lives with multiple family members at home no prior illnesses or disease found to have on coming to emergency room after 2 weeks of insidious shortness of breath mild nonproductive cough night sweats possible TB  She feels fine overall and little better although she states she is having chest and abdominal pain especially when taking deep breaths She has no nausea no vomiting She is asking for pain medication  I discussed her case with Dr. Drucilla Schmidt of infectious disease who recommends trying to induce sputum and get the pleural fluid and test-if there are concerns that this is TB on testing done will need for drug therapy and health department follow-up in the outpatient setting  Patient is stable to admit to MedSurg  Verneita Griffes, MD [facility] 12:12 PM

## 2019-01-25 ENCOUNTER — Inpatient Hospital Stay (HOSPITAL_COMMUNITY): Payer: Self-pay

## 2019-01-25 HISTORY — PX: IR THORACENTESIS ASP PLEURAL SPACE W/IMG GUIDE: IMG5380

## 2019-01-25 LAB — LACTATE DEHYDROGENASE, PLEURAL OR PERITONEAL FLUID: LD, Fluid: 288 U/L — ABNORMAL HIGH (ref 3–23)

## 2019-01-25 LAB — PROTEIN, PLEURAL OR PERITONEAL FLUID: Total protein, fluid: 4.8 g/dL

## 2019-01-25 LAB — CBC WITH DIFFERENTIAL/PLATELET
Abs Immature Granulocytes: 0.04 10*3/uL (ref 0.00–0.07)
Basophils Absolute: 0 10*3/uL (ref 0.0–0.1)
Basophils Relative: 1 %
Eosinophils Absolute: 0.1 10*3/uL (ref 0.0–0.5)
Eosinophils Relative: 1 %
HCT: 34.1 % — ABNORMAL LOW (ref 36.0–46.0)
Hemoglobin: 11.1 g/dL — ABNORMAL LOW (ref 12.0–15.0)
Immature Granulocytes: 1 %
Lymphocytes Relative: 11 %
Lymphs Abs: 0.8 10*3/uL (ref 0.7–4.0)
MCH: 26.9 pg (ref 26.0–34.0)
MCHC: 32.6 g/dL (ref 30.0–36.0)
MCV: 82.8 fL (ref 80.0–100.0)
Monocytes Absolute: 0.5 10*3/uL (ref 0.1–1.0)
Monocytes Relative: 7 %
Neutro Abs: 6.1 10*3/uL (ref 1.7–7.7)
Neutrophils Relative %: 79 %
Platelets: 694 10*3/uL — ABNORMAL HIGH (ref 150–400)
RBC: 4.12 MIL/uL (ref 3.87–5.11)
RDW: 14.2 % (ref 11.5–15.5)
WBC: 7.6 10*3/uL (ref 4.0–10.5)
nRBC: 0 % (ref 0.0–0.2)

## 2019-01-25 LAB — BODY FLUID CELL COUNT WITH DIFFERENTIAL
Eos, Fluid: 0 %
Lymphs, Fluid: 76 %
Monocyte-Macrophage-Serous Fluid: 12 % — ABNORMAL LOW (ref 50–90)
Neutrophil Count, Fluid: 12 % (ref 0–25)
Total Nucleated Cell Count, Fluid: 1280 cu mm — ABNORMAL HIGH (ref 0–1000)

## 2019-01-25 LAB — COMPREHENSIVE METABOLIC PANEL
ALT: 11 U/L (ref 0–44)
AST: 18 U/L (ref 15–41)
Albumin: 2.7 g/dL — ABNORMAL LOW (ref 3.5–5.0)
Alkaline Phosphatase: 41 U/L (ref 38–126)
Anion gap: 9 (ref 5–15)
BUN: <5 mg/dL — ABNORMAL LOW (ref 6–20)
CO2: 21 mmol/L — ABNORMAL LOW (ref 22–32)
Calcium: 8.4 mg/dL — ABNORMAL LOW (ref 8.9–10.3)
Chloride: 105 mmol/L (ref 98–111)
Creatinine, Ser: 0.5 mg/dL (ref 0.44–1.00)
GFR calc Af Amer: >60 mL/min (ref >60)
GFR calc non Af Amer: >60 mL/min (ref >60)
Glucose, Bld: 93 mg/dL (ref 70–99)
Potassium: 4.2 mmol/L (ref 3.5–5.1)
Sodium: 135 mmol/L (ref 135–145)
Total Bilirubin: 0.3 mg/dL (ref 0.3–1.2)
Total Protein: 7 g/dL (ref 6.5–8.1)

## 2019-01-25 LAB — GRAM STAIN

## 2019-01-25 LAB — LACTATE DEHYDROGENASE: LDH: 188 U/L (ref 98–192)

## 2019-01-25 LAB — GLUCOSE, PLEURAL OR PERITONEAL FLUID: Glucose, Fluid: 74 mg/dL

## 2019-01-25 LAB — T.PALLIDUM AB, TOTAL: T Pallidum Abs: NONREACTIVE

## 2019-01-25 MED ORDER — LIDOCAINE HCL 1 % IJ SOLN
INTRAMUSCULAR | Status: DC | PRN
  Administered 2019-01-25: 10 mL

## 2019-01-25 MED ORDER — LIDOCAINE HCL (PF) 1 % IJ SOLN
INTRAMUSCULAR | Status: AC
  Filled 2019-01-25: qty 10

## 2019-01-25 NOTE — Progress Notes (Signed)
Hospitalist daily note   Nancy Hurst [id number] DOB: Oct 23, 1990 DOA: 01/23/2019  PCP: Patient, No Pcp Per   Narrative:  28 year old [place] lady moved here 2 years ago from [place] lives with multiple family members at home no prior illnesses or disease found to have on coming to emergency room after 2 weeks of insidious shortness of breath mild nonproductive cough night sweats possible TB ID consulted recommended confrimation of TB status prior to Empiric therapy  IR consulted and performed Thoraccentesis 12/17  Data Reviewed:  RPR is positive, Covid negative, HIV negative BUN/creatinine 5/0.5 Potassium 3.2 Procalcitonin less than 0.10 Hemoglobin 11 platelets 694 Assessment & Plan: Probable tuberculosis Await QuantiFERON/TB smear/acid-fast culture prior to decision regarding treatment for TB-thoracentesis performed 12/17 Would treat with 4 dose regimen of the same once confirmed Loculated pleural effusion Possibly secondary to TB awaiting cultures and lab work more than likely to be cancerous Positive RPR She had a -1 in 10/2018 would get FTA-ABS which is more sensitive her RPR could be positive because of possible tuberculosis  Subjective: Still having some pain however otherwise is well eating drinking Consultants:   Infectious disease  Radiology Procedures:   None yet Antimicrobials:   None yet   Objective: Vitals:   01/25/19 0353 01/25/19 0354 01/25/19 1156 01/25/19 1337  BP: 109/72  120/84 106/71  Pulse: 86   97  Resp: (!) 23 19 (!) 28   Temp: 98.1 F (36.7 C)  98.7 F (37.1 C)   TempSrc: Oral  Oral   SpO2: 100%  99% 100%  Weight:      Height:        Intake/Output Summary (Last 24 hours) at 01/25/2019 1400 Last data filed at 01/25/2019 0600 Gross per 24 hour  Intake 2239.67 ml  Output --  Net 2239.67 ml   Filed Weights   01/24/19 2241  Weight: 48 kg    Examination: Awake alert coherent no distress EOMI NCAT of the below stated age  abdomen soft nontender no lower extremity edema  Scheduled Meds: . lidocaine (PF)      . potassium chloride  40 mEq Oral Daily   Continuous Infusions: . 0.45 % NaCl with KCl 20 mEq / L 75 mL/hr at 01/25/19 0356     LOS: 1 day   Time spent: [place], MD [facility]

## 2019-01-25 NOTE — Progress Notes (Signed)
Pt underwent left side thoracentesis. VSs are stable . Pt tolerated procedure well.

## 2019-01-25 NOTE — Procedures (Signed)
PROCEDURE SUMMARY:  Successful US guided left thoracentesis. Yielded 800 mL of clear yellow fluid. Patient tolerated procedure well. No immediate complications. EBL = trace  Specimen was sent for labs.  Post procedure chest X-ray ordered.  WENDY S BLAIR PA-C 01/25/2019 4:21 PM

## 2019-01-26 ENCOUNTER — Inpatient Hospital Stay (HOSPITAL_COMMUNITY): Payer: Self-pay

## 2019-01-26 HISTORY — PX: IR PARACENTESIS: IMG2679

## 2019-01-26 LAB — ALBUMIN, PLEURAL OR PERITONEAL FLUID: Albumin, Fluid: 2.3 g/dL

## 2019-01-26 LAB — GRAM STAIN: Gram Stain: NONE SEEN

## 2019-01-26 LAB — CBC WITH DIFFERENTIAL/PLATELET
Abs Immature Granulocytes: 0.04 10*3/uL (ref 0.00–0.07)
Basophils Absolute: 0.1 10*3/uL (ref 0.0–0.1)
Basophils Relative: 1 %
Eosinophils Absolute: 0.2 10*3/uL (ref 0.0–0.5)
Eosinophils Relative: 4 %
HCT: 32.7 % — ABNORMAL LOW (ref 36.0–46.0)
Hemoglobin: 10.8 g/dL — ABNORMAL LOW (ref 12.0–15.0)
Immature Granulocytes: 1 %
Lymphocytes Relative: 16 %
Lymphs Abs: 1 10*3/uL (ref 0.7–4.0)
MCH: 26.9 pg (ref 26.0–34.0)
MCHC: 33 g/dL (ref 30.0–36.0)
MCV: 81.5 fL (ref 80.0–100.0)
Monocytes Absolute: 0.7 10*3/uL (ref 0.1–1.0)
Monocytes Relative: 12 %
Neutro Abs: 4.2 10*3/uL (ref 1.7–7.7)
Neutrophils Relative %: 66 %
Platelets: 644 10*3/uL — ABNORMAL HIGH (ref 150–400)
RBC: 4.01 MIL/uL (ref 3.87–5.11)
RDW: 14.3 % (ref 11.5–15.5)
WBC: 6.3 10*3/uL (ref 4.0–10.5)
nRBC: 0 % (ref 0.0–0.2)

## 2019-01-26 LAB — BASIC METABOLIC PANEL
Anion gap: 9 (ref 5–15)
BUN: <5 mg/dL — ABNORMAL LOW (ref 6–20)
CO2: 21 mmol/L — ABNORMAL LOW (ref 22–32)
Calcium: 8.3 mg/dL — ABNORMAL LOW (ref 8.9–10.3)
Chloride: 106 mmol/L (ref 98–111)
Creatinine, Ser: 0.61 mg/dL (ref 0.44–1.00)
GFR calc Af Amer: >60 mL/min (ref >60)
GFR calc non Af Amer: >60 mL/min (ref >60)
Glucose, Bld: 90 mg/dL (ref 70–99)
Potassium: 4.3 mmol/L (ref 3.5–5.1)
Sodium: 136 mmol/L (ref 135–145)

## 2019-01-26 LAB — CYTOLOGY - NON PAP

## 2019-01-26 LAB — BODY FLUID CELL COUNT WITH DIFFERENTIAL
Eos, Fluid: 0 %
Lymphs, Fluid: 50 %
Monocyte-Macrophage-Serous Fluid: 10 % — ABNORMAL LOW (ref 50–90)
Neutrophil Count, Fluid: 40 % — ABNORMAL HIGH (ref 0–25)
Total Nucleated Cell Count, Fluid: 628 cu mm (ref 0–1000)

## 2019-01-26 LAB — LACTATE DEHYDROGENASE, PLEURAL OR PERITONEAL FLUID: LD, Fluid: 445 U/L — ABNORMAL HIGH (ref 3–23)

## 2019-01-26 LAB — PROTEIN, PLEURAL OR PERITONEAL FLUID: Total protein, fluid: 5.6 g/dL

## 2019-01-26 LAB — ACID FAST SMEAR (AFB, MYCOBACTERIA): Acid Fast Smear: NEGATIVE

## 2019-01-26 LAB — GLUCOSE, PLEURAL OR PERITONEAL FLUID: Glucose, Fluid: 51 mg/dL

## 2019-01-26 MED ORDER — ACETAMINOPHEN 325 MG PO TABS: 325.0000 mg | ORAL_TABLET | Freq: Once | ORAL | Status: AC

## 2019-01-26 MED ORDER — POLYETHYLENE GLYCOL 3350 17 G PO PACK
17.0000 g | PACK | Freq: Every day | ORAL | Status: DC | PRN
  Administered 2019-01-26: 17 g via ORAL
  Filled 2019-01-26: qty 1

## 2019-01-26 MED ORDER — SODIUM CHLORIDE 0.9 % IV BOLUS
500.0000 mL | Freq: Once | INTRAVENOUS | Status: AC
  Administered 2019-01-26: 500 mL via INTRAVENOUS

## 2019-01-26 MED ORDER — LIDOCAINE HCL (PF) 1 % IJ SOLN
INTRAMUSCULAR | Status: DC | PRN
  Administered 2019-01-26: 10 mL

## 2019-01-26 MED ORDER — LIDOCAINE HCL 1 % IJ SOLN
INTRAMUSCULAR | Status: AC
  Filled 2019-01-26: qty 20

## 2019-01-26 NOTE — Procedures (Signed)
PROCEDURE SUMMARY:  Successful image-guided paracentesis from the left lateral abdomen.  Yielded 1.3 liters of clear gold fluid.  No immediate complications.  EBL < 5 mL. Patient tolerated well.   Specimen was sent for labs.  Please see imaging section of Epic for full dictation.   Earley Abide PA-C 01/26/2019 3:40 PM

## 2019-01-26 NOTE — Progress Notes (Signed)
Patient c/o being cold and shivering . Oral temp. 97.5, axillar. Temp 98.1 Rectal temp. done 103.1 R.N.aware and Mendy R.N. on floor and aware. H.R. is 130. Paged Bodenheimer N.P. see orders.

## 2019-01-26 NOTE — Progress Notes (Signed)
PROGRESS NOTE    Fumiko Cham  KDX:[id number] DOB: 03/23/1990 DOA: 01/23/2019 PCP: Patient, No Pcp Per    Brief Narrative:  28 year old [place] speaking female with no medical problems presented to the emergency room with cough and pleuritic chest pain for about 2 to 3 weeks duration.  Recently treated for ESBL UTI.  She went to urgent care center where x-rays showed pleural effusion, was started on doxycycline.  She also felt some abdominal discomfort.  Patient did report subjective fever and chills at night. In the emergency room, hemodynamically stable.  CT scan of the chest with loculated left-sided pleural effusion, large volume abdominal ascites and omental caking.   Assessment & Plan:   Principal Problem:   Loculated pleural effusion Active Problems:   Abdominal ascites  Suspected disseminated tuberculosis: Less likely cancer.  Currently with no pulmonary symptoms.  Hemodynamically stable. Status post thoracentesis, 800 mL exudate with protein more than 4.  Negative cultures.  First AFB negative. Status post paracentesis, 1300 mL exudate.  No bacterial growth. AFB/fungal cultures pending.  Cytology pending. Patient has no sputum production for a sputum AFB. Gold QuantiFERON test pending. Infectious disease recommended consult after results, will call consult tomorrow in anticipation of discharge planning.  Severe protein calorie malnutrition: Nutrition Status: Temporal wasting, poor appetite, BMI of 18 Intervention: Nutrition consult.  Supplemental nutrition.  Hypokalemia: Improved.                 DVT prophylaxis: SCDs Code Status: Full code Family Communication: None Disposition Plan: Discharge home after diagnosis   Consultants:   None  Procedures:   Thoracentesis, 12/17  Paracentesis, 12/18  Antimicrobials:   None   Subjective: Patient seen and examined.  Used video interpreter.  Underwent paracentesis today.  On my  examination, patient had no complaints.  No more pain.  She is walking around in the room. Wants to take shower. Denies any chest pain or abdominal pain.  Objective: Vitals:   01/26/19 1515 01/26/19 1520 01/26/19 1524 01/26/19 1525  BP: 115/79 106/74 103/70 105/74  Pulse: 86 77  77  Resp:      Temp:      TempSrc:      SpO2: 100% 100% 100% 100%  Weight:      Height:        Intake/Output Summary (Last 24 hours) at 01/26/2019 1742 Last data filed at 01/26/2019 1528 Gross per 24 hour  Intake 120 ml  Output 1300 ml  Net -1180 ml   Filed Weights   01/24/19 2241  Weight: 48 kg    Examination:  General exam: Appears calm and comfortable, thinly built, on room air Respiratory system: Clear to auscultation. Respiratory effort normal. Cardiovascular system: S1 & S2 heard, RRR. No JVD, murmurs, rubs, gallops or clicks. No pedal edema. Gastrointestinal system: Abdomen is distended but soft.   Central nervous system: Alert and oriented. No focal neurological deficits. Extremities: Symmetric 5 x 5 power. Skin: No rashes, lesions or ulcers Psychiatry: Judgement and insight appear normal. Mood & affect appropriate.     Data Reviewed: I have personally reviewed following labs and imaging studies  CBC: Recent Labs  Lab 01/23/19 1635 01/24/19 0450 01/25/19 1307 01/26/19 0714  WBC 6.3 6.5 7.6 6.3  NEUTROABS  --  4.5 6.1 4.2  HGB 11.1* 10.7* 11.1* 10.8*  HCT 34.2* 32.4* 34.1* 32.7*  MCV 83.6 82.7 82.8 81.5  PLT 743* 645* 694* 976*   Basic Metabolic Panel: Recent Labs  Lab 01/23/19  1635 01/24/19 0450 01/25/19 1307 01/26/19 0714  NA 137 136 135 136  K 3.5 3.2* 4.2 4.3  CL 101 101 105 106  CO2 24 22 21* 21*  GLUCOSE 93 82 93 90  BUN 8 8 <5* <5*  CREATININE 0.68 0.69 0.50 0.61  CALCIUM 8.9 8.6* 8.4* 8.3*   GFR: Estimated Creatinine Clearance: 80 mL/min (by C-G formula based on SCr of 0.61 mg/dL). Liver Function Tests: Recent Labs  Lab 01/23/19 1635 01/24/19 0450  01/25/19 1307  AST 20 18 18   ALT 13 11 11   ALKPHOS 43 39 41  BILITOT 0.4 0.6 0.3  PROT 8.3* 7.7 7.0  ALBUMIN 3.1* 3.0* 2.7*   Recent Labs  Lab 01/23/19 1635  LIPASE 42   No results for input(s): AMMONIA in the last 168 hours. Coagulation Profile: No results for input(s): INR, PROTIME in the last 168 hours. Cardiac Enzymes: No results for input(s): CKTOTAL, CKMB, CKMBINDEX, TROPONINI in the last 168 hours. BNP (last 3 results) No results for input(s): PROBNP in the last 8760 hours. HbA1C: No results for input(s): HGBA1C in the last 72 hours. CBG: No results for input(s): GLUCAP in the last 168 hours. Lipid Profile: No results for input(s): CHOL, HDL, LDLCALC, TRIG, CHOLHDL, LDLDIRECT in the last 72 hours. Thyroid Function Tests: No results for input(s): TSH, T4TOTAL, FREET4, T3FREE, THYROIDAB in the last 72 hours. Anemia Panel: No results for input(s): VITAMINB12, FOLATE, FERRITIN, TIBC, IRON, RETICCTPCT in the last 72 hours. Sepsis Labs: Recent Labs  Lab 01/24/19 0450  PROCALCITON <0.10    Recent Results (from the past 240 hour(s))  Blood culture (routine x 2)     Status: None (Preliminary result)   Collection Time: 01/24/19  4:38 AM   Specimen: BLOOD RIGHT HAND  Result Value Ref Range Status   Specimen Description BLOOD RIGHT HAND  Final   Special Requests   Final    BOTTLES DRAWN AEROBIC AND ANAEROBIC Blood Culture results may not be optimal due to an inadequate volume of blood received in culture bottles   Culture   Final    NO GROWTH 2 DAYS Performed at [facility], [address], [place], [address] [place]    Report Status PENDING  Incomplete  Blood culture (routine x 2)     Status: None (Preliminary result)   Collection Time: 01/24/19  4:38 AM   Specimen: BLOOD  Result Value Ref Range Status   Specimen Description BLOOD LEFT ANTECUBITAL  Final   Special Requests   Final    BOTTLES DRAWN AEROBIC AND ANAEROBIC Blood Culture adequate volume    Culture   Final    NO GROWTH 2 DAYS Performed at [facility], [address] [place], [address] [place]    Report Status PENDING  Incomplete  SARS CORONAVIRUS 2 (TAT 6-24 HRS) Nasopharyngeal Nasopharyngeal Swab     Status: None   Collection Time: 01/24/19  8:53 AM   Specimen: Nasopharyngeal Swab  Result Value Ref Range Status   SARS Coronavirus 2 NEGATIVE NEGATIVE Final    Comment: (NOTE) SARS-CoV-2 target nucleic acids are NOT DETECTED. The SARS-CoV-2 RNA is generally detectable in upper and lower respiratory specimens during the acute phase of infection. Negative results do not preclude SARS-CoV-2 infection, do not rule out co-infections with other pathogens, and should not be used as the sole basis for treatment or other patient management decisions. Negative results must be combined with clinical observations, patient history, and epidemiological information. The expected result  is Negative. Fact Sheet for Patients: HairSlick.no Fact Sheet for Healthcare Providers: quierodirigir.com This test is not yet approved or cleared by the [place] FDA and  has been authorized for detection and/or diagnosis of SARS-CoV-2 by FDA under an Emergency Use Authorization (EUA). This EUA will remain  in effect (meaning this test can be used) for the duration of the COVID-19 declaration under Section 56 4(b)(1) of the Act, 21 U.S.C. section 360bbb-3(b)(1), unless the authorization is terminated or revoked sooner. Performed at [facility], [address], [place], [place] [address]   Gram stain     Status: None   Collection Time: 01/25/19  1:53 PM   Specimen: Lung, Left; Pleural Fluid  Result Value Ref Range Status   Specimen Description PLEURAL LEFT  Final   Special Requests NONE  Final   Gram Stain   Final    ABUNDANT WBC PRESENT, PREDOMINANTLY MONONUCLEAR NO ORGANISMS SEEN Performed at [facility], [address], [place], [place] [address]    Report Status 01/25/2019 FINAL  Final  Acid Fast Smear (AFB)     Status: None   Collection Time: 01/25/19  1:53 PM   Specimen: Lung, Left; Pleural Fluid  Result Value Ref Range Status   AFB Specimen Processing Concentration  Final   Acid Fast Smear Negative  Final    Comment: (NOTE) Performed At: [facility] [address] [place], [place] [id number] Jolene Schimke MD WG:[id number]    Source (AFB) PLEURAL  Final    Comment: LEFT Performed at [facility], [address], [place], [place] [address]   Culture, body fluid-bottle     Status: None (Preliminary result)   Collection Time: 01/25/19  1:53 PM   Specimen: Pleura  Result Value Ref Range Status   Specimen Description PLEURAL LEFT  Final   Special Requests NONE  Final   Culture   Final    NO GROWTH 1 DAY Performed at [facility], [address], [place], [place] [address]    Report Status PENDING  Incomplete         Radiology Studies: DG Chest 1 View  Result Date: 01/25/2019 CLINICAL DATA:  Follow-up left-sided pleural effusion. Status post thoracentesis EXAM: CHEST  1 VIEW COMPARISON:  Chest x-ray 01/23/2019 and CT scan 01/24/2019 FINDINGS: Interval evacuation of the left pleural effusion. No significant residual pleural fluid is identified. There is moderate left lower lobe atelectasis. Persistent nodular interstitial process in the lungs. No postprocedural pneumothorax. IMPRESSION: 1. Evacuation of left pleural effusion status post thoracentesis. No postprocedural pneumothorax. 2. Stable diffuse nodular interstitial process in the lungs. 3. Left lower lobe atelectasis. Electronically Signed   By: Rudie Meyer M.D.   On: 01/25/2019 17:14   IR Paracentesis  Result Date: 01/26/2019 INDICATION: Patient with history of dyspnea, probable TB, loculated pleural effusion s/p thoracentesis in IR 01/25/2019, abdominal distension, and ascites. Request is made for  diagnostic and therapeutic paracentesis. EXAM: ULTRASOUND GUIDED DIAGNOSTIC AND THERAPEUTIC PARACENTESIS MEDICATIONS: 7 mL 1% lidocaine COMPLICATIONS: None immediate. PROCEDURE: Informed written consent was obtained from the patient after a discussion of the risks, benefits and alternatives to treatment. A timeout was performed prior to the initiation of the procedure. Initial ultrasound scanning demonstrates a moderate amount of ascites within the left lower abdominal quadrant. The left lower abdomen was prepped and draped in the usual sterile fashion. 1% lidocaine was used for local anesthesia. Following this, a 19 gauge, 7-cm, Yueh catheter was  introduced. An ultrasound image was saved for documentation purposes. The paracentesis was performed. The catheter was removed and a dressing was applied. The patient tolerated the procedure well without immediate post procedural complication. FINDINGS: A total of approximately 1.3 L of clear gold fluid was removed. Samples were sent to the laboratory as requested by the clinical team. IMPRESSION: Successful ultrasound-guided paracentesis yielding 1.3 L of peritoneal fluid. Spanish language medical interpreter was used during today's procedure. Read by: Elwin MochaAlexandra Louk, PA-C Electronically Signed   By: Irish LackGlenn  Yamagata M.D.   On: 01/26/2019 15:44   IR THORACENTESIS ASP PLEURAL SPACE W/IMG GUIDE  Result Date: 01/25/2019 INDICATION: Cough and shortness of breath. Left pleural effusion. Rule out tuberculosis. Request for diagnostic and therapeutic thoracentesis. EXAM: ULTRASOUND GUIDED LEFT THORACENTESIS MEDICATIONS: 1% lidocaine 8 mL COMPLICATIONS: None immediate. PROCEDURE: An ultrasound guided thoracentesis was thoroughly discussed with the patient and questions answered. The benefits, risks, alternatives and complications were also discussed. The patient understands and wishes to proceed with the procedure. Written consent was obtained. Ultrasound was performed to  localize and mark an adequate pocket of fluid in the left chest. The area was then prepped and draped in the normal sterile fashion. 1% Lidocaine was used for local anesthesia. Under ultrasound guidance a 6 Fr Safe-T-Centesis catheter was introduced. Thoracentesis was performed. The catheter was removed and a dressing applied. FINDINGS: A total of approximately 800 mL of clear yellow fluid was removed. Samples were sent to the laboratory as requested by the clinical team. IMPRESSION: Successful ultrasound guided left thoracentesis yielding 800 mL of pleural fluid. Read by: Corrin ParkerWendy Blair, PA-C Electronically Signed   By: Irish LackGlenn  Yamagata M.D.   On: 01/25/2019 16:20        Scheduled Meds: . lidocaine       Continuous Infusions:   LOS: 2 days    Time spent: 30 minutes    Dorcas CarrowKuber Radiance Deady, MD [facility] Pager [phone]

## 2019-01-26 NOTE — Progress Notes (Addendum)
Bedside paracentesis completed by IR staff, patient tolerated procedure well, vital signs are stable will continue to monitor.

## 2019-01-27 DIAGNOSIS — Z9089 Acquired absence of other organs: Secondary | ICD-10-CM

## 2019-01-27 DIAGNOSIS — K659 Peritonitis, unspecified: Secondary | ICD-10-CM

## 2019-01-27 DIAGNOSIS — D649 Anemia, unspecified: Secondary | ICD-10-CM | POA: Diagnosis present

## 2019-01-27 DIAGNOSIS — R109 Unspecified abdominal pain: Secondary | ICD-10-CM

## 2019-01-27 DIAGNOSIS — R768 Other specified abnormal immunological findings in serum: Secondary | ICD-10-CM | POA: Diagnosis present

## 2019-01-27 DIAGNOSIS — J90 Pleural effusion, not elsewhere classified: Secondary | ICD-10-CM

## 2019-01-27 DIAGNOSIS — R509 Fever, unspecified: Secondary | ICD-10-CM | POA: Diagnosis present

## 2019-01-27 DIAGNOSIS — E440 Moderate protein-calorie malnutrition: Secondary | ICD-10-CM | POA: Diagnosis present

## 2019-01-27 DIAGNOSIS — Z87440 Personal history of urinary (tract) infections: Secondary | ICD-10-CM

## 2019-01-27 LAB — HEPATITIS PANEL, ACUTE
HCV Ab: NONREACTIVE
Hep A IgM: NONREACTIVE
Hep B C IgM: NONREACTIVE
Hepatitis B Surface Ag: NONREACTIVE

## 2019-01-27 LAB — ACID FAST SMEAR (AFB, MYCOBACTERIA): Acid Fast Smear: NEGATIVE

## 2019-01-27 LAB — QUANTIFERON-TB GOLD PLUS: QuantiFERON-TB Gold Plus: NEGATIVE

## 2019-01-27 LAB — QUANTIFERON-TB GOLD PLUS (RQFGPL)
QuantiFERON Mitogen Value: 1.08 IU/mL
QuantiFERON Nil Value: 0.41 IU/mL
QuantiFERON TB1 Ag Value: 0.72 IU/mL
QuantiFERON TB2 Ag Value: 0.72 IU/mL

## 2019-01-27 MED ORDER — IBUPROFEN 600 MG PO TABS
600.0000 mg | ORAL_TABLET | Freq: Once | ORAL | Status: AC
  Administered 2019-01-27: 600 mg via ORAL
  Filled 2019-01-27: qty 1

## 2019-01-27 MED ORDER — SODIUM CHLORIDE 0.9 % IV BOLUS
1000.0000 mL | Freq: Once | INTRAVENOUS | Status: AC
  Administered 2019-01-27: 1000 mL via INTRAVENOUS

## 2019-01-27 MED ORDER — ENSURE MAX PROTEIN PO LIQD
11.0000 [oz_av] | Freq: Every day | ORAL | Status: DC
  Administered 2019-01-27 – 2019-02-01 (×6): 11 [oz_av] via ORAL
  Filled 2019-01-27 (×6): qty 330

## 2019-01-27 MED ORDER — ENOXAPARIN SODIUM 40 MG/0.4ML ~~LOC~~ SOLN
40.0000 mg | Freq: Every day | SUBCUTANEOUS | Status: DC
  Administered 2019-01-27 – 2019-01-28 (×2): 40 mg via SUBCUTANEOUS
  Filled 2019-01-27 (×2): qty 0.4

## 2019-01-27 MED ORDER — SODIUM CHLORIDE 0.9 % IV SOLN: INTRAVENOUS | Status: DC

## 2019-01-27 MED ORDER — ALBUMIN HUMAN 25 % IV SOLN
50.0000 g | Freq: Four times a day (QID) | INTRAVENOUS | Status: DC
  Administered 2019-01-27: 50 g via INTRAVENOUS
  Filled 2019-01-27 (×2): qty 200

## 2019-01-27 MED ORDER — ALBUMIN HUMAN 25 % IV SOLN
50.0000 g | Freq: Four times a day (QID) | INTRAVENOUS | Status: AC
  Administered 2019-01-27 – 2019-01-28 (×3): 50 g via INTRAVENOUS
  Filled 2019-01-27 (×3): qty 200

## 2019-01-27 NOTE — Progress Notes (Addendum)
B.P. 81/59 P-84 Patient voiced no complaints. Skin has been warm and dry. Marland KitchenSpoke with spanish inter. And patient is aware of the situation and had no questions. Patient has not voided since before bedtime 10 pm tonight. Patient does not feel uncomfortable bladder scan got 369 ml Patient has been told not to get out of bed alone. Charge R.N. aware and Mindy R.N. aware.. Awaiting Bodenheimer return call.

## 2019-01-27 NOTE — Progress Notes (Signed)
Bodenheimer N.P. return call see orders.

## 2019-01-27 NOTE — Consult Note (Signed)
[facility]    Date of Admission:  01/23/2019          Reason for Consult: Fever, exudative, lymphocytic pleural effusion and peritonitis    Referring Provider: Dr. Raphael Gibney   Assessment: Given her constellation of findings I am certainly concerned about tuberculosis.  QuantiFERON TB Gold assay was negative but this does not rule out active tuberculosis.  If she had acute bacterial peritonitis and pleuritis I would expect her to be much sicker.  However, if she continues to have high fever and becomes more unstable I would consider treating her with meropenem pending final stains and cultures.  I suspect that her positive RPR is a false positive.  Plan: 1. Observe off of antibiotics for now 2. Await results of AFB smear and culture  Principal Problem:   Loculated pleural effusion Active Problems:   Abdominal ascites   Fever   Recurrent UTI   Normocytic anemia   Moderate protein-calorie malnutrition (HCC)   Biological false positive RPR test   Scheduled Meds: . enoxaparin (LOVENOX) injection  40 mg Subcutaneous Daily  . Ensure Max Protein  11 oz Oral Daily   Continuous Infusions: . sodium chloride 75 mL/hr at 01/27/19 0907  . albumin human 50 g (01/27/19 1731)   PRN Meds:.acetaminophen **OR** acetaminophen, HYDROcodone-acetaminophen, lidocaine (PF), lidocaine, ondansetron **OR** ondansetron (ZOFRAN) IV, polyethylene glycol  HPI: Nancy Hurst is a 28 y.o. female who has a 15-day history of progressive fever, dry cough, right-sided pleuritic chest pain and painful abdominal distention.  She was seen in urgent care on 12/23/2018 where chest x-ray revealed a left pleural effusion.  She was started on oral doxycycline.  She did not improve leading to admission on 01/24/2019.  Chest x-ray and CT scan showed a moderate size left pleural effusion.  800 cc of clear yellow fluid was removed.  It is exudative with a white blood cell  count of 1280 of which 76% are lymphocytes.  No organisms were seen on Gram stain.  AFB stain and cultures are pending.  A CT scan of her abdomen showed caseating lymph node enlargement, ascites, peritoneal thickening and omental caking.  1.3 L of clear golden fluid was obtained by paracentesis yesterday.  Fluid analyses are still pending.  She was initially afebrile but had a temperature of 103.1 degrees last night.  She has had intermittent hypotension.  She has history of recurrent UTIs.  She was hospitalized in September with a UTI caused by an ESBL producing E. coli.  Her RPR was negative at that time.  RPR was repeated on this admission and is positive at a titer of 1:1.  Her treponemal antibody is negative.  Her HIV antibody is negative.  She immigrated from [place] 2 years ago.  He has no known contact with anyone with active tuberculosis.  She has never been diagnosed with any sexually transmitted diseases or hepatitis.  She was hospitalized in September 2019 with perforated appendicitis and underwent a laparoscopic appendectomy.   Review of Systems: Review of Systems  Constitutional: Positive for chills, diaphoresis, fever, malaise/fatigue and weight loss.  HENT: Negative for congestion and sore throat.   Respiratory: Positive for cough. Negative for sputum production and shortness of breath.   Cardiovascular: Positive for chest pain.  Gastrointestinal: Positive for abdominal pain. Negative for diarrhea, nausea and vomiting.  Genitourinary: Negative for dysuria.  Musculoskeletal: Negative for back pain and joint pain.  Skin: Negative  for rash.  Neurological: Negative for headaches.    History reviewed. No pertinent past medical history.  Social History   Tobacco Use  . Smoking status: Never Smoker  . Smokeless tobacco: Never Used  Substance Use Topics  . Alcohol use: No  . Drug use: No    Family History  Family history unknown: Yes   No Known  Allergies  OBJECTIVE: Blood pressure 124/81, pulse 100, temperature 99.7 F (37.6 C), temperature source Oral, resp. rate (!) 29, height 5\' 3"  (1.6 m), weight 48 kg, last menstrual period 01/09/2019, SpO2 99 %.  Physical Exam Constitutional:      Comments: She is resting quietly in bed.  She is thin but does not appear ill.  HENT:     Mouth/Throat:     Pharynx: No oropharyngeal exudate.  Cardiovascular:     Rate and Rhythm: Normal rate and regular rhythm.     Heart sounds: No murmur.  Pulmonary:     Effort: Pulmonary effort is normal.     Breath sounds: Normal breath sounds.  Abdominal:     General: There is distension.     Palpations: Abdomen is soft.     Tenderness: There is abdominal tenderness.  Musculoskeletal:        General: No swelling or tenderness.  Skin:    Findings: No rash.  Neurological:     General: No focal deficit present.  Psychiatric:        Mood and Affect: Mood normal.     Lab Results Lab Results  Component Value Date   WBC 6.3 01/26/2019   HGB 10.8 (L) 01/26/2019   HCT 32.7 (L) 01/26/2019   MCV 81.5 01/26/2019   PLT 644 (H) 01/26/2019    Lab Results  Component Value Date   CREATININE 0.61 01/26/2019   BUN <5 (L) 01/26/2019   NA 136 01/26/2019   K 4.3 01/26/2019   CL 106 01/26/2019   CO2 21 (L) 01/26/2019    Lab Results  Component Value Date   ALT 11 01/25/2019   AST 18 01/25/2019   ALKPHOS 41 01/25/2019   BILITOT 0.3 01/25/2019     Microbiology: Recent Results (from the past 240 hour(s))  Blood culture (routine x 2)     Status: None (Preliminary result)   Collection Time: 01/24/19  4:38 AM   Specimen: BLOOD RIGHT HAND  Result Value Ref Range Status   Specimen Description BLOOD RIGHT HAND  Final   Special Requests   Final    BOTTLES DRAWN AEROBIC AND ANAEROBIC Blood Culture results may not be optimal due to an inadequate volume of blood received in culture bottles   Culture   Final    NO GROWTH 3 DAYS Performed at [facility], [place] [address], [place], [place] [address]    Report Status PENDING  Incomplete  Blood culture (routine x 2)     Status: None (Preliminary result)   Collection Time: 01/24/19  4:38 AM   Specimen: BLOOD  Result Value Ref Range Status   Specimen Description BLOOD LEFT ANTECUBITAL  Final   Special Requests   Final    BOTTLES DRAWN AEROBIC AND ANAEROBIC Blood Culture adequate volume   Culture   Final    NO GROWTH 3 DAYS Performed at [facility], [place] [address] [place], [place] [address]    Report Status PENDING  Incomplete  SARS CORONAVIRUS 2 (TAT 6-24 HRS) Nasopharyngeal Nasopharyngeal Swab     Status: None  Collection Time: 01/24/19  8:53 AM   Specimen: Nasopharyngeal Swab  Result Value Ref Range Status   SARS Coronavirus 2 NEGATIVE NEGATIVE Final    Comment: (NOTE) SARS-CoV-2 target nucleic acids are NOT DETECTED. The SARS-CoV-2 RNA is generally detectable in upper and lower respiratory specimens during the acute phase of infection. Negative results do not preclude SARS-CoV-2 infection, do not rule out co-infections with other pathogens, and should not be used as the sole basis for treatment or other patient management decisions. Negative results must be combined with clinical observations, patient history, and epidemiological information. The expected result is Negative. Fact Sheet for Patients: HairSlick.no Fact Sheet for Healthcare Providers: quierodirigir.com This test is not yet approved or cleared by the [place] FDA and  has been authorized for detection and/or diagnosis of SARS-CoV-2 by FDA under an Emergency Use Authorization (EUA). This EUA will remain  in effect (meaning this test can be used) for the duration of the COVID-19 declaration under Section 56 4(b)(1) of the Act, 21 U.S.C. section 360bbb-3(b)(1), unless the authorization is terminated or revoked sooner. Performed at [facility], [address] [place], [place] [address]   Gram stain     Status: None   Collection Time: 01/25/19  1:53 PM   Specimen: Lung, Left; Pleural Fluid  Result Value Ref Range Status   Specimen Description PLEURAL LEFT  Final   Special Requests NONE  Final   Gram Stain   Final    ABUNDANT WBC PRESENT, PREDOMINANTLY MONONUCLEAR NO ORGANISMS SEEN Performed at [facility], [address], [place], [place] [address]    Report Status 01/25/2019 FINAL  Final  Acid Fast Smear (AFB)     Status: None   Collection Time: 01/25/19  1:53 PM   Specimen: Lung, Left; Pleural Fluid  Result Value Ref Range Status   AFB Specimen Processing Concentration  Final   Acid Fast Smear Negative  Final    Comment: (NOTE) Performed At: [facility] [address] [place], [place] [id number] Jolene Schimke MD WG:[id number]    Source (AFB) PLEURAL  Final    Comment: LEFT Performed at [facility], [address], [place], [place] [address]   Culture, body fluid-bottle     Status: None (Preliminary result)   Collection Time: 01/25/19  1:53 PM   Specimen: Pleura  Result Value Ref Range Status   Specimen Description PLEURAL LEFT  Final   Special Requests NONE  Final   Culture   Final    NO GROWTH 2 DAYS Performed at [facility], [address] [place], [place] [address]    Report Status PENDING  Incomplete  Gram stain     Status: None   Collection Time: 01/26/19  3:41 PM   Specimen: Abdomen; Peritoneal Fluid  Result Value Ref Range Status   Specimen Description FLUID PERITONEAL  Final   Special Requests NONE  Final   Gram Stain   Final    NO WBC SEEN NO ORGANISMS SEEN Performed at [facility], [address], [place], [place] [address]    Report Status 01/26/2019 FINAL  Final  Culture, body fluid-bottle     Status: None (Preliminary result)   Collection Time: 01/26/19  3:41 PM   Specimen: Fluid  Result Value Ref Range Status   Specimen Description FLUID  PERITONEAL  Final   Special Requests BOTTLES DRAWN AEROBIC AND ANAEROBIC 10CC  Final   Culture   Final  NO GROWTH < 24 HOURS Performed at [facility], [address] [place], [place] [address]    Report Status PENDING  Incomplete    Cliffton AstersJohn Gerrianne Aydelott, MD [facility] [phone] pager   [phone] cell 01/27/2019, 5:58 PM

## 2019-01-27 NOTE — Significant Event (Signed)
Rapid Response Event Note  Overview: During rounds, bedside RN concerned about pt having chills/rigors and tachycardia (HR-130s). Oral/axillary temp WNL. Suggested to RN to check rectal temp as oral/axillary temps may not be accurate based on pt presentation. Rectal temp 103.1. Recommended treating fever with tylenol and monitoring HR response to fever reduction. RN notified NP of fever-500cc NS bolus and additional 325mg  tylenol ordered(pt already given 650mg ). Please call RRT if assistance needed.    Time Called: 2045 Arrival Time: 2045 Event Type: Other (Comment)(chills)      Linna Darner, Carren Rang

## 2019-01-27 NOTE — Progress Notes (Addendum)
Text page Bodenheimer N.P. Vital Signs after 1000 ml N.S. fuild bolus left arm 88/62, right arm 94/65 Temp. Is down 98.6 oral and rectal. R. N. Aware and Mindy R.N. aware

## 2019-01-27 NOTE — Progress Notes (Signed)
PROGRESS NOTE  Nancy Hurst UJW:[id number] DOB: 09-Oct-1990 DOA: 01/23/2019 PCP: Patient, No Pcp Per  HPI/Recap of past 68 hours: 28 year old Spanish speaking female with no medical problems presented to the emergency room with cough and pleuritic chest pain for about 2 to 3 weeks duration.  Recently treated for ESBL UTI.  She went to urgent care center where x-rays showed pleural effusion, was started on doxycycline.  She also felt some abdominal discomfort.  Patient did report subjective fever and chills at night. In the emergency room, hemodynamically stable.  CT scan of the chest with loculated left-sided pleural effusion, large volume abdominal ascites and omental caking.  01/27/19: Seen and examined.  Interviewed using interpreter at bedside [employer] at bedside.  She reports cough is improved.  Admits to night sweats and unintentional weight loss.  No known exposure to TB.  Possible sexual partner.  Reports back pain at the site of LP.  Non treponemal test reactive, treponemal test nonreactive.  QuantiFERON gold plus nonreactive.  Body fluid acid-fast and culture in process.  Assessment/Plan: Principal Problem:   Loculated pleural effusion Active Problems:   Abdominal ascites  Suspected disseminated tuberculosis Independently reviewed CT chest 12/16 which showed large likely loculated left sided pleural effusion, bilateral pulmonary nodules, bronchiectasis upper lobes with areas of calcification. QuantiFERON gold plus nonreactive Body fluid acid-fast and culture in process.  Follow cultures. If positive will consult infectious disease  Possibly false positive syphilis test Non treponemal test RPR reactive Treponemal test non reactive We will curbside with infectious disease  Hypotension Required IV fluid boluses overnight Continue normal saline at 75 cc/h Added IV albumin 50 g every 6 hours x4 doses. Maintain MAP greater than 65.  Loculated left pleural  effusion status post left thoracentesis 12/17 800 cc fluid removed Gram stain no organisms seen Cultures in process continue to follow cultures  Abdominal ascites post paracentesis 12/18 1.3 L of peritoneal fluid removed Cultures in process, continue to follow cultures  Severe protein calorie malnutrition with muscular wasting Albumin 2.7 BMI 18 Add Oral supplement  DVT prophylaxis: SCDs and subcu Lovenox daily. Code Status: Full code Family Communication: None Disposition Plan: Discharge home after diagnosis   Consultants:   None  Procedures:   Thoracentesis, 12/17  Paracentesis, 12/18  Antimicrobials:   None     Objective: Vitals:   01/27/19 0700 01/27/19 0714 01/27/19 0715 01/27/19 0820  BP: (!) (!) 88/53  Pulse:    78  Resp: (!) 23  (!) 25 (!) 56  Temp:  98.1 F (36.7 C)  97.9 F (36.6 C)  TempSrc:  Oral  Oral  SpO2:    99%  Weight:      Height:        Intake/Output Summary (Last 24 hours) at 01/27/2019 0843 Last data filed at 01/27/2019 0507 Gross per 24 hour  Intake 2730 ml  Output 1300 ml  Net 1430 ml   Filed Weights   01/24/19 2241  Weight: 48 kg    Exam:  . General: 28 y.o. year-old female well developed well nourished in no acute distress.  Alert and oriented x3. . Cardiovascular: Regular rate and rhythm with no rubs or gallops.  No thyromegaly or JVD noted.   Marland Kitchen Respiratory: Mild rales at bases.  No wheezing noted.  Poor inspiratory effort.   . Abdomen: Soft nontender nondistended with normal bowel sounds x4 quadrants. . Musculoskeletal: No lower extremity edema. 2/4 pulses in all 4 extremities. Marland Kitchen  Psychiatry: Mood is appropriate for condition and setting   Data Reviewed: CBC: Recent Labs  Lab 01/23/19 1635 01/24/19 0450 01/25/19 1307 01/26/19 0714  WBC 6.3 6.5 7.6 6.3  NEUTROABS  --  4.5 6.1 4.2  HGB 11.1* 10.7* 11.1* 10.8*  HCT 34.2* 32.4* 34.1* 32.7*  MCV 83.6 82.7 82.8 81.5  PLT 743* 645* 694*  644*   Basic Metabolic Panel: Recent Labs  Lab 01/23/19 1635 01/24/19 0450 01/25/19 1307 01/26/19 0714  NA 137 136 135 136  K 3.5 3.2* 4.2 4.3  CL 101 101 105 106  CO2 24 22 21* 21*  GLUCOSE 93 82 93 90  BUN 8 8 <5* <5*  CREATININE 0.68 0.69 0.50 0.61  CALCIUM 8.9 8.6* 8.4* 8.3*   GFR: Estimated Creatinine Clearance: 80 mL/min (by C-G formula based on SCr of 0.61 mg/dL). Liver Function Tests: Recent Labs  Lab 01/23/19 1635 01/24/19 0450 01/25/19 1307  AST 20 18 18   ALT 13 11 11   ALKPHOS 43 39 41  BILITOT 0.4 0.6 0.3  PROT 8.3* 7.7 7.0  ALBUMIN 3.1* 3.0* 2.7*   Recent Labs  Lab 01/23/19 1635  LIPASE 42   No results for input(s): AMMONIA in the last 168 hours. Coagulation Profile: No results for input(s): INR, PROTIME in the last 168 hours. Cardiac Enzymes: No results for input(s): CKTOTAL, CKMB, CKMBINDEX, TROPONINI in the last 168 hours. BNP (last 3 results) No results for input(s): PROBNP in the last 8760 hours. HbA1C: No results for input(s): HGBA1C in the last 72 hours. CBG: No results for input(s): GLUCAP in the last 168 hours. Lipid Profile: No results for input(s): CHOL, HDL, LDLCALC, TRIG, CHOLHDL, LDLDIRECT in the last 72 hours. Thyroid Function Tests: No results for input(s): TSH, T4TOTAL, FREET4, T3FREE, THYROIDAB in the last 72 hours. Anemia Panel: No results for input(s): VITAMINB12, FOLATE, FERRITIN, TIBC, IRON, RETICCTPCT in the last 72 hours. Urine analysis:    Component Value Date/Time   COLORURINE YELLOW 01/24/2019 0038   APPEARANCEUR HAZY (A) 01/24/2019 0038   LABSPEC 1.020 01/24/2019 0038   PHURINE 5.0 01/24/2019 0038   GLUCOSEU NEGATIVE 01/24/2019 0038   HGBUR SMALL (A) 01/24/2019 0038   BILIRUBINUR NEGATIVE 01/24/2019 0038   KETONESUR 5 (A) 01/24/2019 0038   PROTEINUR NEGATIVE 01/24/2019 0038   NITRITE NEGATIVE 01/24/2019 0038   LEUKOCYTESUR NEGATIVE 01/24/2019 0038   Sepsis Labs: @LABRCNTIP (procalcitonin:4,lacticidven:4)   ) Recent Results (from the past 240 hour(s))  Blood culture (routine x 2)     Status: None (Preliminary result)   Collection Time: 01/24/19  4:38 AM   Specimen: BLOOD RIGHT HAND  Result Value Ref Range Status   Specimen Description BLOOD RIGHT HAND  Final   Special Requests   Final    BOTTLES DRAWN AEROBIC AND ANAEROBIC Blood Culture results may not be optimal due to an inadequate volume of blood received in culture bottles   Culture   Final    NO GROWTH 2 DAYS Performed at [facility], [address] [place], [facility] [address]    Report Status PENDING  Incomplete  Blood culture (routine x 2)     Status: None (Preliminary result)   Collection Time: 01/24/19  4:38 AM   Specimen: BLOOD  Result Value Ref Range Status   Specimen Description BLOOD LEFT ANTECUBITAL  Final   Special Requests   Final    BOTTLES DRAWN AEROBIC AND ANAEROBIC Blood Culture adequate volume   Culture   Final    NO GROWTH  2 DAYS Performed at [facility], [place] [address], [place], [place] [address]    Report Status PENDING  Incomplete  SARS CORONAVIRUS 2 (TAT 6-24 HRS) Nasopharyngeal Nasopharyngeal Swab     Status: None   Collection Time: 01/24/19  8:53 AM   Specimen: Nasopharyngeal Swab  Result Value Ref Range Status   SARS Coronavirus 2 NEGATIVE NEGATIVE Final    Comment: (NOTE) SARS-CoV-2 target nucleic acids are NOT DETECTED. The SARS-CoV-2 RNA is generally detectable in upper and lower respiratory specimens during the acute phase of infection. Negative results do not preclude SARS-CoV-2 infection, do not rule out co-infections with other pathogens, and should not be used as the sole basis for treatment or other patient management decisions. Negative results must be combined with clinical observations, patient history, and epidemiological information. The expected result is Negative. Fact Sheet for Patients: SugarRoll.be Fact Sheet for Healthcare Providers:  https://www.woods-mathews.com/ This test is not yet approved or cleared by the [place] FDA and  has been authorized for detection and/or diagnosis of SARS-CoV-2 by FDA under an Emergency Use Authorization (EUA). This EUA will remain  in effect (meaning this test can be used) for the duration of the COVID-19 declaration under Section 56 4(b)(1) of the Act, 21 U.S.C. section 360bbb-3(b)(1), unless the authorization is terminated or revoked sooner. Performed at [facility], [place] [address] [place], [place] [address]   Gram stain     Status: None   Collection Time: 01/25/19  1:53 PM   Specimen: Lung, Left; Pleural Fluid  Result Value Ref Range Status   Specimen Description PLEURAL LEFT  Final   Special Requests NONE  Final   Gram Stain   Final    ABUNDANT WBC PRESENT, PREDOMINANTLY MONONUCLEAR NO ORGANISMS SEEN Performed at [facility], [place] [address] [place], [place] [address]    Report Status 01/25/2019 FINAL  Final  Acid Fast Smear (AFB)     Status: None   Collection Time: 01/25/19  1:53 PM   Specimen: Lung, Left; Pleural Fluid  Result Value Ref Range Status   AFB Specimen Processing Concentration  Final   Acid Fast Smear Negative  Final    Comment: (NOTE) Performed At: [facility] [place], [place] [id number] Rush Farmer MD UM:[id number]    Source (AFB) PLEURAL  Final    Comment: LEFT Performed at [facility], [place] [address] [place], [place] [address]   Culture, body fluid-bottle     Status: None (Preliminary result)   Collection Time: 01/25/19  1:53 PM   Specimen: Pleura  Result Value Ref Range Status   Specimen Description PLEURAL LEFT  Final   Special Requests NONE  Final   Culture   Final    NO GROWTH 1 DAY Performed at [facility], [place] [address], [place], [place] [address]    Report Status PENDING  Incomplete  Gram stain     Status: None   Collection Time: 01/26/19  3:41 PM   Specimen:  Abdomen; Peritoneal Fluid  Result Value Ref Range Status   Specimen Description FLUID PERITONEAL  Final   Special Requests NONE  Final   Gram Stain   Final    NO WBC SEEN NO ORGANISMS SEEN Performed at [facility], [address], [place], [place] [address]    Report Status 01/26/2019 FINAL  Final      Studies: IR Paracentesis  Result Date: 01/26/2019 INDICATION: Patient with history  of dyspnea, probable TB, loculated pleural effusion s/p thoracentesis in IR 01/25/2019, abdominal distension, and ascites. Request is made for diagnostic and therapeutic paracentesis. EXAM: ULTRASOUND GUIDED DIAGNOSTIC AND THERAPEUTIC PARACENTESIS MEDICATIONS: 7 mL 1% lidocaine COMPLICATIONS: None immediate. PROCEDURE: Informed written consent was obtained from the patient after a discussion of the risks, benefits and alternatives to treatment. A timeout was performed prior to the initiation of the procedure. Initial ultrasound scanning demonstrates a moderate amount of ascites within the left lower abdominal quadrant. The left lower abdomen was prepped and draped in the usual sterile fashion. 1% lidocaine was used for local anesthesia. Following this, a 19 gauge, 7-cm, Yueh catheter was introduced. An ultrasound image was saved for documentation purposes. The paracentesis was performed. The catheter was removed and a dressing was applied. The patient tolerated the procedure well without immediate post procedural complication. FINDINGS: A total of approximately 1.3 L of clear gold fluid was removed. Samples were sent to the laboratory as requested by the clinical team. IMPRESSION: Successful ultrasound-guided paracentesis yielding 1.3 L of peritoneal fluid. Spanish language medical interpreter was used during today's procedure. Read by: Elwin MochaAlexandra Louk, PA-C Electronically Signed   By: Irish LackGlenn  Yamagata M.D.   On: 01/26/2019 15:44    Scheduled Meds:  Continuous Infusions: . sodium chloride    . albumin human        LOS: 3 days     Darlin Droparole N Kamylle Axelson, MD [facility] Pager [phone]  If 7PM-7AM, please contact night-coverage [email or web address] Password [email or web address] 01/27/2019, 8:43 AM

## 2019-01-27 NOTE — Progress Notes (Signed)
Text page Bodenheimer N.P. Patient B.P. pre fluid bolus was 88/62 p 107 t-101.5 rectal. Post fluid bolus B.P. 59/16 p-107

## 2019-01-27 NOTE — Progress Notes (Signed)
Text page Bodenheimer that T.B. blood work has come back negative for T.B. .R. N. aware

## 2019-01-28 DIAGNOSIS — R140 Abdominal distension (gaseous): Secondary | ICD-10-CM

## 2019-01-28 LAB — COMPREHENSIVE METABOLIC PANEL
ALT: 11 U/L (ref 0–44)
AST: 21 U/L (ref 15–41)
Albumin: 4.6 g/dL (ref 3.5–5.0)
Alkaline Phosphatase: 33 U/L — ABNORMAL LOW (ref 38–126)
Anion gap: 12 (ref 5–15)
BUN: <5 mg/dL — ABNORMAL LOW (ref 6–20)
CO2: 19 mmol/L — ABNORMAL LOW (ref 22–32)
Calcium: 8.7 mg/dL — ABNORMAL LOW (ref 8.9–10.3)
Chloride: 108 mmol/L (ref 98–111)
Creatinine, Ser: 0.55 mg/dL (ref 0.44–1.00)
GFR calc Af Amer: >60 mL/min (ref >60)
GFR calc non Af Amer: >60 mL/min (ref >60)
Glucose, Bld: 90 mg/dL (ref 70–99)
Potassium: 3.5 mmol/L (ref 3.5–5.1)
Sodium: 139 mmol/L (ref 135–145)
Total Bilirubin: 0.4 mg/dL (ref 0.3–1.2)
Total Protein: 7.1 g/dL (ref 6.5–8.1)

## 2019-01-28 LAB — CBC
HCT: 24.6 % — ABNORMAL LOW (ref 36.0–46.0)
Hemoglobin: 8.1 g/dL — ABNORMAL LOW (ref 12.0–15.0)
MCH: 27 pg (ref 26.0–34.0)
MCHC: 32.9 g/dL (ref 30.0–36.0)
MCV: 82 fL (ref 80.0–100.0)
Platelets: 530 10*3/uL — ABNORMAL HIGH (ref 150–400)
RBC: 3 MIL/uL — ABNORMAL LOW (ref 3.87–5.11)
RDW: 14.5 % (ref 11.5–15.5)
WBC: 6.3 10*3/uL (ref 4.0–10.5)
nRBC: 0 % (ref 0.0–0.2)

## 2019-01-28 LAB — HEMOGLOBIN AND HEMATOCRIT, BLOOD
HCT: 27.5 % — ABNORMAL LOW (ref 36.0–46.0)
Hemoglobin: 9.2 g/dL — ABNORMAL LOW (ref 12.0–15.0)

## 2019-01-28 NOTE — Progress Notes (Signed)
Patient ID: Nancy Hurst, female   DOB: 1990-08-31, 28 y.o.   MRN: [id number]        [facility]  Date of Admission:  01/23/2019     ASSESSMENT: The cause of her fever, lymphocytic exudative pleural effusion and peritonitis remains uncertain.  Her pleural fluid AFB stain is negative.  Gram stain and routine culture are negative.  Cytology is negative.  Her peritoneal fluid AFB stain is negative.  Gram stain and routine cultures are negative.  Cytology is pending.  She had more high fever last night but looks clinically stable.  I am still concerned about the possibility of extrapulmonary tuberculosis.  She will probably need repeat paracentesis soon.  This should be a large volume (1 L) so that the specimen can be centrifuged and have the AFB stain repeated to increase its yield.  She also needs to have peritoneal fluid ADA and albumin levels checked which can be helpful for the diagnosis of peritoneal tuberculosis.  I also talked to them about the possibility of peritoneal biopsy to aid with a more rapid diagnosis.  PLAN: 1. Continue observation off of antibiotics 2. Consider repeat paracentesis and possible peritoneal biopsy pending final AFB cultures  Principal Problem:   Loculated pleural effusion Active Problems:   Abdominal ascites   Fever   Recurrent UTI   Normocytic anemia   Moderate protein-calorie malnutrition (HCC)   Biological false positive RPR test   Scheduled Meds: . enoxaparin (LOVENOX) injection  40 mg Subcutaneous Daily  . Ensure Max Protein  11 oz Oral Daily   Continuous Infusions: . sodium chloride 75 mL/hr at 01/27/19 0907   PRN Meds:.acetaminophen **OR** acetaminophen, HYDROcodone-acetaminophen, lidocaine (PF), lidocaine, ondansetron **OR** ondansetron (ZOFRAN) IV, polyethylene glycol   SUBJECTIVE: She notes more abdominal distention today but very little abdominal pain.  Her pleuritic chest pain resolved after  thoracentesis.  Review of Systems: Review of Systems  Constitutional: Positive for chills, diaphoresis, fever and weight loss.  Respiratory: Positive for cough. Negative for hemoptysis, sputum production and shortness of breath.   Cardiovascular: Negative for chest pain.  Gastrointestinal: Positive for abdominal pain. Negative for diarrhea, nausea and vomiting.  Genitourinary: Negative for dysuria.  Musculoskeletal: Negative for back pain and joint pain.  Neurological: Negative for headaches.    No Known Allergies  OBJECTIVE: Vitals:   01/28/19 0030 01/28/19 0413 01/28/19 0836 01/28/19 1240  BP:  108/64 113/77 (!) 143/75  Pulse:   97 93  Resp:  (!) 24 (!) 21 (!) 22  Temp: 98.5 F (36.9 C) 98.7 F (37.1 C) 98.7 F (37.1 C) 99 F (37.2 C)  TempSrc: Oral Oral Oral Oral  SpO2:  93% 96% 93%  Weight:      Height:       Body mass index is 18.74 kg/m.  Physical Exam Constitutional:      Comments: She is resting quietly in bed and appears comfortable.  Her significant other Nancy Hurst is at the bedside and interprets for her.  Cardiovascular:     Rate and Rhythm: Normal rate and regular rhythm.     Heart sounds: No murmur.  Pulmonary:     Effort: Pulmonary effort is normal.     Breath sounds: Normal breath sounds.  Abdominal:     General: There is distension.     Palpations: Abdomen is soft.     Tenderness: There is no guarding or rebound.     Comments: Her abdomen is more distended today  but is only minimally tender to palpation.  Psychiatric:        Mood and Affect: Mood normal.     Lab Results Lab Results  Component Value Date   WBC 6.3 01/28/2019   HGB 9.2 (L) 01/28/2019   HCT 27.5 (L) 01/28/2019   MCV 82.0 01/28/2019   PLT 530 (H) 01/28/2019    Lab Results  Component Value Date   CREATININE 0.55 01/28/2019   BUN <5 (L) 01/28/2019   NA 139 01/28/2019   K 3.5 01/28/2019   CL 108 01/28/2019   CO2 19 (L) 01/28/2019    Lab Results  Component Value  Date   ALT 11 01/28/2019   AST 21 01/28/2019   ALKPHOS 33 (L) 01/28/2019   BILITOT 0.4 01/28/2019     Microbiology: Recent Results (from the past 240 hour(s))  Blood culture (routine x 2)     Status: None (Preliminary result)   Collection Time: 01/24/19  4:38 AM   Specimen: BLOOD RIGHT HAND  Result Value Ref Range Status   Specimen Description BLOOD RIGHT HAND  Final   Special Requests   Final    BOTTLES DRAWN AEROBIC AND ANAEROBIC Blood Culture results may not be optimal due to an inadequate volume of blood received in culture bottles   Culture   Final    NO GROWTH 4 DAYS Performed at [facility], [address] [place], [place] [address]    Report Status PENDING  Incomplete  Blood culture (routine x 2)     Status: None (Preliminary result)   Collection Time: 01/24/19  4:38 AM   Specimen: BLOOD  Result Value Ref Range Status   Specimen Description BLOOD LEFT ANTECUBITAL  Final   Special Requests   Final    BOTTLES DRAWN AEROBIC AND ANAEROBIC Blood Culture adequate volume   Culture   Final    NO GROWTH 4 DAYS Performed at [facility], [address] [place], [place] [address]    Report Status PENDING  Incomplete  SARS CORONAVIRUS 2 (TAT 6-24 HRS) Nasopharyngeal Nasopharyngeal Swab     Status: None   Collection Time: 01/24/19  8:53 AM   Specimen: Nasopharyngeal Swab  Result Value Ref Range Status   SARS Coronavirus 2 NEGATIVE NEGATIVE Final    Comment: (NOTE) SARS-CoV-2 target nucleic acids are NOT DETECTED. The SARS-CoV-2 RNA is generally detectable in upper and lower respiratory specimens during the acute phase of infection. Negative results do not preclude SARS-CoV-2 infection, do not rule out co-infections with other pathogens, and should not be used as the sole basis for treatment or other patient management decisions. Negative results must be combined with clinical observations, patient history, and epidemiological information. The expected result  is Negative. Fact Sheet for Patients: HairSlick.no Fact Sheet for Healthcare Providers: quierodirigir.com This test is not yet approved or cleared by the [place] FDA and  has been authorized for detection and/or diagnosis of SARS-CoV-2 by FDA under an Emergency Use Authorization (EUA). This EUA will remain  in effect (meaning this test can be used) for the duration of the COVID-19 declaration under Section 56 4(b)(1) of the Act, 21 U.S.C. section 360bbb-3(b)(1), unless the authorization is terminated or revoked sooner. Performed at [facility], [address] [place], [place] [address]   Gram stain     Status: None   Collection Time: 01/25/19  1:53 PM   Specimen: Lung, Left; Pleural Fluid  Result Value Ref Range Status  Specimen Description PLEURAL LEFT  Final   Special Requests NONE  Final   Gram Stain   Final    ABUNDANT WBC PRESENT, PREDOMINANTLY MONONUCLEAR NO ORGANISMS SEEN Performed at [facility], [address], [place], [place] [address]    Report Status 01/25/2019 FINAL  Final  Acid Fast Smear (AFB)     Status: None   Collection Time: 01/25/19  1:53 PM   Specimen: Lung, Left; Pleural Fluid  Result Value Ref Range Status   AFB Specimen Processing Concentration  Final   Acid Fast Smear Negative  Final    Comment: (NOTE) Performed At: [facility] [address] [place], [place] [id number] Jolene Schimke MD OZ:[id number]    Source (AFB) PLEURAL  Final    Comment: LEFT Performed at [facility], [address] [place], [place] [address]   Culture, body fluid-bottle     Status: None (Preliminary result)   Collection Time: 01/25/19  1:53 PM   Specimen: Pleura  Result Value Ref Range Status   Specimen Description PLEURAL LEFT  Final   Special Requests NONE  Final   Culture   Final    NO GROWTH 3 DAYS Performed at [facility], [address] [place], [place]  [address]    Report Status PENDING  Incomplete  Gram stain     Status: None   Collection Time: 01/26/19  3:41 PM   Specimen: Abdomen; Peritoneal Fluid  Result Value Ref Range Status   Specimen Description FLUID PERITONEAL  Final   Special Requests NONE  Final   Gram Stain   Final    NO WBC SEEN NO ORGANISMS SEEN Performed at [facility], [address] [place], [place] [address]    Report Status 01/26/2019 FINAL  Final  Acid Fast Smear (AFB)     Status: None   Collection Time: 01/26/19  3:41 PM   Specimen: Abdomen; Peritoneal Fluid  Result Value Ref Range Status   AFB Specimen Processing Concentration  Final   Acid Fast Smear Negative  Final    Comment: (NOTE) Performed At: [facility] [address] [place], [place] [id number] Jolene Schimke MD IH:[id number]    Source (AFB) FLUID  Final    Comment: PERITONEAL Performed at [facility], [address] [place], [place] [address]   Culture, body fluid-bottle     Status: None (Preliminary result)   Collection Time: 01/26/19  3:41 PM   Specimen: Fluid  Result Value Ref Range Status   Specimen Description FLUID PERITONEAL  Final   Special Requests BOTTLES DRAWN AEROBIC AND ANAEROBIC 10CC  Final   Culture   Final    NO GROWTH 2 DAYS Performed at [facility], [address] [place], [place] [address]    Report Status PENDING  Incomplete  Culture, blood (routine x 2)     Status: None (Preliminary result)   Collection Time: 01/27/19  4:45 PM   Specimen: BLOOD  Result Value Ref Range Status   Specimen Description BLOOD LEFT ANTECUBITAL  Final   Special Requests   Final    BOTTLES DRAWN AEROBIC AND ANAEROBIC Blood Culture adequate volume   Culture   Final    NO GROWTH < 24 HOURS Performed at [facility], [address], [place], [place] [address]    Report Status PENDING  Incomplete  Culture, blood (routine x 2)     Status: None (Preliminary result)   Collection  Time: 01/27/19  4:53 PM    Specimen: BLOOD LEFT HAND  Result Value Ref Range Status   Specimen Description BLOOD LEFT HAND  Final   Special Requests   Final    BOTTLES DRAWN AEROBIC AND ANAEROBIC Blood Culture adequate volume   Culture   Final    NO GROWTH < 24 HOURS Performed at [facility], [address] [place], [place] [id number]    Report Status PENDING  Incomplete    Cliffton AstersJohn Eldrige Pitkin, MD [facility] [phone] pager   [phone] cell 01/28/2019, 4:16 PM

## 2019-01-28 NOTE — Progress Notes (Signed)
PROGRESS NOTE  Nancy Hurst IZT:[id number] DOB: 01-Dec-1990 DOA: 01/23/2019 PCP: Patient, No Pcp Per  HPI/Recap of past 48 hours: 28 year old Spanish speaking female with no medical problems presented to the emergency room with cough and pleuritic chest pain for about 2 to 3 weeks duration.  Recently treated for ESBL UTI.  She went to urgent care center where x-rays showed pleural effusion, was started on doxycycline.  She also felt some abdominal discomfort.  Patient did report subjective fever and chills at night. In the emergency room, hemodynamically stable.  CT scan of the chest with loculated left-sided pleural effusion, large volume abdominal ascites and omental caking.  01/27/19: Seen and examined.  Interviewed using interpreter at bedside [employer] at bedside.  She reports cough is improved.  Admits to night sweats and unintentional weight loss.  No known exposure to TB.  Possible sexual partner.  Reports back pain at the site of LP.  Non treponemal test reactive, treponemal test nonreactive.  QuantiFERON gold plus nonreactive.  Body fluid acid-fast and culture in process.  01/28/19: Seen and examined using interpreter#[id number].  Feels bloated.  Abdominal distention noted on exam.  Reports intermittent nonproductive cough with no hemoptysis.  Continues to have recurrent fevers with T-max of 103 overnight.  Assessment/Plan: Principal Problem:   Loculated pleural effusion Active Problems:   Recurrent UTI   Abdominal ascites   Normocytic anemia   Moderate protein-calorie malnutrition (HCC)   Fever   Biological false positive RPR test  Suspected disseminated tuberculosis Independently reviewed CT chest 12/16 which showed large likely loculated left sided pleural effusion, bilateral pulmonary nodules, bronchiectasis upper lobes with areas of calcification.  CT abdomen and pelvis also showed concern for disseminated tuberculosis. QuantiFERON gold plus nonreactive AFB  smear negative  AFB culture in process.  Continue to follow cultures. Seen by infectious disease, following. Continue to hold off antibiotics for now as recommended by infectious disease.  SIRS, suspect infection related with possible disseminated TB Febrile with Tmax 103 overnight Tachycardic likely driven by fever Continue to treat symptomatically Blood CX x2 obtained on 12/19.  Continue to follow cultures. Management per above  False positive syphilis test Non treponemal test RPR reactive Treponemal test non reactive, titer unremarkable. HIV nonreactive  Resolved hypotension Continue normal saline at 75 cc/h Continue to maintain MAP greater than 65  Acute blood loss anemia Suspect dilutional in the setting of IV fluid hydration. Hemoglobin 9.2 from 10.8 Continue to monitor H&H  Loculated left pleural effusion status post left thoracentesis 12/17 800 cc fluid removed Gram stain no organisms seen Cultures in process continue to follow cultures  Abdominal ascites post paracentesis 12/18 1.3 L of peritoneal fluid removed Cultures in process, continue to follow cultures Repeat hepatic panel negative  Severe protein calorie malnutrition with muscular wasting Albumin 2.7 BMI 18 Continue oral supplement  DVT prophylaxis: SCDs and subcu Lovenox daily. Code Status: Full code Family Communication: None Disposition Plan: Discharge home after diagnosis   Consultants:   None  Procedures:   Thoracentesis, 12/17  Paracentesis, 12/18  Antimicrobials:   None     Objective: Vitals:   01/28/19 0030 01/28/19 0413 01/28/19 0836 01/28/19 1240  BP:  108/64 113/77 (!) 143/75  Pulse:   97 93  Resp:  (!) 24 (!) 21 (!) 22  Temp: 98.5 F (36.9 C) 98.7 F (37.1 C) 98.7 F (37.1 C) 99 F (37.2 C)  TempSrc: Oral Oral Oral Oral  SpO2:  93% 96% 93%  Weight:  Height:        Intake/Output Summary (Last 24 hours) at 01/28/2019 1457 Last data filed at 01/28/2019  1244 Gross per 24 hour  Intake 450 ml  Output --  Net 450 ml   Filed Weights   01/24/19 2241  Weight: 48 kg    Exam:  . General: 28 y.o. year-old female well-developed well-nourished no acute stress.  Alert and oriented x3. . Cardiovascular: Regular rate and rhythm no rubs or gallops.  No JVD or thyromegaly noted. Marland Kitchen. Respiratory: Mild rales at bases.  No wheezing noted.  Poor inspiratory effort.   . Abdomen: Distended bowel sounds present.   . Musculoskeletal: No lower extremity edema.  Palpable pulses in all 4 extremities. Marland Kitchen. Psychiatry: Mood is appropriate for condition and setting.   Data Reviewed: CBC: Recent Labs  Lab 01/23/19 1635 01/24/19 0450 01/25/19 1307 01/26/19 0714 01/28/19 0325 01/28/19 0729  WBC 6.3 6.5 7.6 6.3 6.3  --   NEUTROABS  --  4.5 6.1 4.2  --   --   HGB 11.1* 10.7* 11.1* 10.8* 8.1* 9.2*  HCT 34.2* 32.4* 34.1* 32.7* 24.6* 27.5*  MCV 83.6 82.7 82.8 81.5 82.0  --   PLT 743* 645* 694* 644* 530*  --    Basic Metabolic Panel: Recent Labs  Lab 01/23/19 1635 01/24/19 0450 01/25/19 1307 01/26/19 0714 01/28/19 0325  NA 137 136 135 136 139  K 3.5 3.2* 4.2 4.3 3.5  CL 101 101 105 106 108  CO2 24 22 21* 21* 19*  GLUCOSE 93 82 93 90 90  BUN 8 8 <5* <5* <5*  CREATININE 0.68 0.69 0.50 0.61 0.55  CALCIUM 8.9 8.6* 8.4* 8.3* 8.7*   GFR: Estimated Creatinine Clearance: 80 mL/min (by C-G formula based on SCr of 0.55 mg/dL). Liver Function Tests: Recent Labs  Lab 01/23/19 1635 01/24/19 0450 01/25/19 1307 01/28/19 0325  AST 20 18 18 21   ALT 13 11 11 11   ALKPHOS 43 39 41 33*  BILITOT 0.4 0.6 0.3 0.4  PROT 8.3* 7.7 7.0 7.1  ALBUMIN 3.1* 3.0* 2.7* 4.6   Recent Labs  Lab 01/23/19 1635  LIPASE 42   No results for input(s): AMMONIA in the last 168 hours. Coagulation Profile: No results for input(s): INR, PROTIME in the last 168 hours. Cardiac Enzymes: No results for input(s): CKTOTAL, CKMB, CKMBINDEX, TROPONINI in the last 168 hours. BNP (last  3 results) No results for input(s): PROBNP in the last 8760 hours. HbA1C: No results for input(s): HGBA1C in the last 72 hours. CBG: No results for input(s): GLUCAP in the last 168 hours. Lipid Profile: No results for input(s): CHOL, HDL, LDLCALC, TRIG, CHOLHDL, LDLDIRECT in the last 72 hours. Thyroid Function Tests: No results for input(s): TSH, T4TOTAL, FREET4, T3FREE, THYROIDAB in the last 72 hours. Anemia Panel: No results for input(s): VITAMINB12, FOLATE, FERRITIN, TIBC, IRON, RETICCTPCT in the last 72 hours. Urine analysis:    Component Value Date/Time   COLORURINE YELLOW 01/24/2019 0038   APPEARANCEUR HAZY (A) 01/24/2019 0038   LABSPEC 1.020 01/24/2019 0038   PHURINE 5.0 01/24/2019 0038   GLUCOSEU NEGATIVE 01/24/2019 0038   HGBUR SMALL (A) 01/24/2019 0038   BILIRUBINUR NEGATIVE 01/24/2019 0038   KETONESUR 5 (A) 01/24/2019 0038   PROTEINUR NEGATIVE 01/24/2019 0038   NITRITE NEGATIVE 01/24/2019 0038   LEUKOCYTESUR NEGATIVE 01/24/2019 0038   Sepsis Labs: @LABRCNTIP (procalcitonin:4,lacticidven:4)  ) Recent Results (from the past 240 hour(s))  Blood culture (routine x 2)     Status: None (Preliminary  result)   Collection Time: 01/24/19  4:38 AM   Specimen: BLOOD RIGHT HAND  Result Value Ref Range Status   Specimen Description BLOOD RIGHT HAND  Final   Special Requests   Final    BOTTLES DRAWN AEROBIC AND ANAEROBIC Blood Culture results may not be optimal due to an inadequate volume of blood received in culture bottles   Culture   Final    NO GROWTH 4 DAYS Performed at [facility], [address], [place], [place] [address]    Report Status PENDING  Incomplete  Blood culture (routine x 2)     Status: None (Preliminary result)   Collection Time: 01/24/19  4:38 AM   Specimen: BLOOD  Result Value Ref Range Status   Specimen Description BLOOD LEFT ANTECUBITAL  Final   Special Requests   Final    BOTTLES DRAWN AEROBIC AND ANAEROBIC Blood Culture adequate volume    Culture   Final    NO GROWTH 4 DAYS Performed at [facility], [address] [place], [place] [address]    Report Status PENDING  Incomplete  SARS CORONAVIRUS 2 (TAT 6-24 HRS) Nasopharyngeal Nasopharyngeal Swab     Status: None   Collection Time: 01/24/19  8:53 AM   Specimen: Nasopharyngeal Swab  Result Value Ref Range Status   SARS Coronavirus 2 NEGATIVE NEGATIVE Final    Comment: (NOTE) SARS-CoV-2 target nucleic acids are NOT DETECTED. The SARS-CoV-2 RNA is generally detectable in upper and lower respiratory specimens during the acute phase of infection. Negative results do not preclude SARS-CoV-2 infection, do not rule out co-infections with other pathogens, and should not be used as the sole basis for treatment or other patient management decisions. Negative results must be combined with clinical observations, patient history, and epidemiological information. The expected result is Negative. Fact Sheet for Patients: HairSlick.no Fact Sheet for Healthcare Providers: quierodirigir.com This test is not yet approved or cleared by the [place] FDA and  has been authorized for detection and/or diagnosis of SARS-CoV-2 by FDA under an Emergency Use Authorization (EUA). This EUA will remain  in effect (meaning this test can be used) for the duration of the COVID-19 declaration under Section 56 4(b)(1) of the Act, 21 U.S.C. section 360bbb-3(b)(1), unless the authorization is terminated or revoked sooner. Performed at [facility], [address] [place], [place] [address]   Gram stain     Status: None   Collection Time: 01/25/19  1:53 PM   Specimen: Lung, Left; Pleural Fluid  Result Value Ref Range Status   Specimen Description PLEURAL LEFT  Final   Special Requests NONE  Final   Gram Stain   Final    ABUNDANT WBC PRESENT, PREDOMINANTLY MONONUCLEAR NO ORGANISMS SEEN Performed at [facility], [address], [place], [place] [address]    Report Status 01/25/2019 FINAL  Final  Acid Fast Smear (AFB)     Status: None   Collection Time: 01/25/19  1:53 PM   Specimen: Lung, Left; Pleural Fluid  Result Value Ref Range Status   AFB Specimen Processing Concentration  Final   Acid Fast Smear Negative  Final    Comment: (NOTE) Performed At: [facility] [address] [place], [place] [id number] Jolene Schimke MD FU:[id number]    Source (AFB) PLEURAL  Final    Comment: LEFT Performed at [facility], [address] [place], [place] [address]   Culture, body fluid-bottle     Status:  None (Preliminary result)   Collection Time: 01/25/19  1:53 PM   Specimen: Pleura  Result Value Ref Range Status   Specimen Description PLEURAL LEFT  Final   Special Requests NONE  Final   Culture   Final    NO GROWTH 3 DAYS Performed at [facility], [address], [place], [place] [address]    Report Status PENDING  Incomplete  Gram stain     Status: None   Collection Time: 01/26/19  3:41 PM   Specimen: Abdomen; Peritoneal Fluid  Result Value Ref Range Status   Specimen Description FLUID PERITONEAL  Final   Special Requests NONE  Final   Gram Stain   Final    NO WBC SEEN NO ORGANISMS SEEN Performed at [facility], [address] [place], [place] [address]    Report Status 01/26/2019 FINAL  Final  Acid Fast Smear (AFB)     Status: None   Collection Time: 01/26/19  3:41 PM   Specimen: Abdomen; Peritoneal Fluid  Result Value Ref Range Status   AFB Specimen Processing Concentration  Final   Acid Fast Smear Negative  Final    Comment: (NOTE) Performed At: [facility] [address] [place], [place] [id number] Jolene Schimke MD GE:[id number]    Source (AFB) FLUID  Final    Comment: PERITONEAL Performed at [facility], [address], [place], [place] [address]   Culture, body fluid-bottle     Status: None (Preliminary result)   Collection Time:  01/26/19  3:41 PM   Specimen: Fluid  Result Value Ref Range Status   Specimen Description FLUID PERITONEAL  Final   Special Requests BOTTLES DRAWN AEROBIC AND ANAEROBIC 10CC  Final   Culture   Final    NO GROWTH 2 DAYS Performed at [facility], [address] [place], [place] [address]    Report Status PENDING  Incomplete  Culture, blood (routine x 2)     Status: None (Preliminary result)   Collection Time: 01/27/19  4:45 PM   Specimen: BLOOD  Result Value Ref Range Status   Specimen Description BLOOD LEFT ANTECUBITAL  Final   Special Requests   Final    BOTTLES DRAWN AEROBIC AND ANAEROBIC Blood Culture adequate volume   Culture   Final    NO GROWTH < 24 HOURS Performed at [facility], [address], [place], [place] [address]    Report Status PENDING  Incomplete  Culture, blood (routine x 2)     Status: None (Preliminary result)   Collection Time: 01/27/19  4:53 PM   Specimen: BLOOD LEFT HAND  Result Value Ref Range Status   Specimen Description BLOOD LEFT HAND  Final   Special Requests   Final    BOTTLES DRAWN AEROBIC AND ANAEROBIC Blood Culture adequate volume   Culture   Final    NO GROWTH < 24 HOURS Performed at [facility], [address], [place], [place] [address]    Report Status PENDING  Incomplete      Studies: No results found.  Scheduled Meds: . enoxaparin (LOVENOX) injection  40 mg Subcutaneous Daily  . Ensure Max Protein  11 oz Oral Daily    Continuous Infusions: . sodium chloride 75 mL/hr at 01/27/19 0907     LOS: 4 days     Darlin Drop, MD [facility] Pager [phone]  If 7PM-7AM, please contact night-coverage [email or web address] Password [email or web address] 01/28/2019, 2:57 PM

## 2019-01-29 ENCOUNTER — Inpatient Hospital Stay (HOSPITAL_COMMUNITY): Payer: Self-pay

## 2019-01-29 DIAGNOSIS — A199 Miliary tuberculosis, unspecified: Secondary | ICD-10-CM

## 2019-01-29 DIAGNOSIS — Z0389 Encounter for observation for other suspected diseases and conditions ruled out: Secondary | ICD-10-CM

## 2019-01-29 LAB — COMPREHENSIVE METABOLIC PANEL
ALT: 17 U/L (ref 0–44)
AST: 30 U/L (ref 15–41)
Albumin: 3.4 g/dL — ABNORMAL LOW (ref 3.5–5.0)
Alkaline Phosphatase: 33 U/L — ABNORMAL LOW (ref 38–126)
Anion gap: 6 (ref 5–15)
BUN: 5 mg/dL — ABNORMAL LOW (ref 6–20)
CO2: 20 mmol/L — ABNORMAL LOW (ref 22–32)
Calcium: 8.4 mg/dL — ABNORMAL LOW (ref 8.9–10.3)
Chloride: 112 mmol/L — ABNORMAL HIGH (ref 98–111)
Creatinine, Ser: 0.55 mg/dL (ref 0.44–1.00)
GFR calc Af Amer: >60 mL/min (ref >60)
GFR calc non Af Amer: >60 mL/min (ref >60)
Glucose, Bld: 95 mg/dL (ref 70–99)
Potassium: 3.4 mmol/L — ABNORMAL LOW (ref 3.5–5.1)
Sodium: 138 mmol/L (ref 135–145)
Total Bilirubin: 0.5 mg/dL (ref 0.3–1.2)
Total Protein: 6.1 g/dL — ABNORMAL LOW (ref 6.5–8.1)

## 2019-01-29 LAB — CBC WITH DIFFERENTIAL/PLATELET
Abs Immature Granulocytes: 0.03 10*3/uL (ref 0.00–0.07)
Basophils Absolute: 0.1 10*3/uL (ref 0.0–0.1)
Basophils Relative: 1 %
Eosinophils Absolute: 0.1 10*3/uL (ref 0.0–0.5)
Eosinophils Relative: 1 %
HCT: 29.4 % — ABNORMAL LOW (ref 36.0–46.0)
Hemoglobin: 9.6 g/dL — ABNORMAL LOW (ref 12.0–15.0)
Immature Granulocytes: 0 %
Lymphocytes Relative: 15 %
Lymphs Abs: 1 10*3/uL (ref 0.7–4.0)
MCH: 27 pg (ref 26.0–34.0)
MCHC: 32.7 g/dL (ref 30.0–36.0)
MCV: 82.6 fL (ref 80.0–100.0)
Monocytes Absolute: 0.7 10*3/uL (ref 0.1–1.0)
Monocytes Relative: 10 %
Neutro Abs: 4.9 10*3/uL (ref 1.7–7.7)
Neutrophils Relative %: 73 %
Platelets: 556 10*3/uL — ABNORMAL HIGH (ref 150–400)
RBC: 3.56 MIL/uL — ABNORMAL LOW (ref 3.87–5.11)
RDW: 15.5 % (ref 11.5–15.5)
WBC: 6.7 10*3/uL (ref 4.0–10.5)
nRBC: 0 % (ref 0.0–0.2)

## 2019-01-29 LAB — CULTURE, BLOOD (ROUTINE X 2)
Culture: NO GROWTH
Culture: NO GROWTH
Special Requests: ADEQUATE

## 2019-01-29 LAB — CYTOLOGY - NON PAP

## 2019-01-29 LAB — PROTIME-INR
INR: 1.3 — ABNORMAL HIGH (ref 0.8–1.2)
Prothrombin Time: 16 seconds — ABNORMAL HIGH (ref 11.4–15.2)

## 2019-01-29 LAB — HCG, SERUM, QUALITATIVE: Preg, Serum: NEGATIVE

## 2019-01-29 MED ORDER — LIDOCAINE HCL 1 % IJ SOLN
INTRAMUSCULAR | Status: AC
  Filled 2019-01-29: qty 20

## 2019-01-29 MED ORDER — ENOXAPARIN SODIUM 40 MG/0.4ML ~~LOC~~ SOLN
40.0000 mg | Freq: Every day | SUBCUTANEOUS | Status: DC
  Administered 2019-01-30 – 2019-01-31 (×2): 40 mg via SUBCUTANEOUS
  Filled 2019-01-29 (×2): qty 0.4

## 2019-01-29 MED ORDER — DM-GUAIFENESIN ER 30-600 MG PO TB12
2.0000 | ORAL_TABLET | Freq: Two times a day (BID) | ORAL | Status: DC
  Administered 2019-01-29 – 2019-02-01 (×6): 2 via ORAL
  Filled 2019-01-29 (×7): qty 2

## 2019-01-29 MED ORDER — POTASSIUM CHLORIDE CRYS ER 20 MEQ PO TBCR
20.0000 meq | EXTENDED_RELEASE_TABLET | Freq: Three times a day (TID) | ORAL | Status: AC
  Administered 2019-01-29 – 2019-01-30 (×3): 20 meq via ORAL
  Filled 2019-01-29 (×3): qty 1

## 2019-01-29 MED ORDER — HYDROCOD POLST-CPM POLST ER 10-8 MG/5ML PO SUER
5.0000 mL | Freq: Two times a day (BID) | ORAL | Status: AC
  Administered 2019-01-29 – 2019-01-31 (×5): 5 mL via ORAL
  Filled 2019-01-29 (×5): qty 5

## 2019-01-29 MED ORDER — SENNOSIDES-DOCUSATE SODIUM 8.6-50 MG PO TABS
2.0000 | ORAL_TABLET | Freq: Two times a day (BID) | ORAL | Status: DC
  Administered 2019-01-29 – 2019-01-31 (×2): 2 via ORAL
  Filled 2019-01-29 (×4): qty 2

## 2019-01-29 MED ORDER — POLYETHYLENE GLYCOL 3350 17 G PO PACK
17.0000 g | PACK | Freq: Every day | ORAL | Status: DC
  Filled 2019-01-29 (×2): qty 1

## 2019-01-29 NOTE — Progress Notes (Signed)
PROGRESS NOTE  Nancy Hurst HCW:[id number] DOB: 1991/01/30 DOA: 01/23/2019 PCP: Patient, No Pcp Per  HPI/Recap of past 80 hours: 28 year old Spanish speaking female with no medical problems presented to the emergency room with cough and pleuritic chest pain for about 2 to 3 weeks duration.  Recently treated for ESBL UTI.  She went to urgent care center where x-rays showed pleural effusion, was started on doxycycline.  She also felt some abdominal discomfort.  Patient did report subjective fever and chills at night. In the emergency room, hemodynamically stable.  CT scan of the chest with loculated left-sided pleural effusion, large volume abdominal ascites and omental caking.  01/27/19: Seen and examined.  Interviewed using interpreter at bedside [employer] at bedside.  She reports cough is improved.  Admits to night sweats and unintentional weight loss.  No known exposure to TB.  Possible sexual partner.  Reports back pain at the site of LP.  Non treponemal test reactive, treponemal test nonreactive.  QuantiFERON gold plus nonreactive.  Body fluid acid-fast and culture in process.  01/28/19: Seen and examined using interpreter#[id number].  Feels bloated.  Abdominal distention noted on exam.  Reports intermittent nonproductive cough with no hemoptysis.  Continues to have recurrent fevers with T-max of 103 overnight.  01/29/19: Seen and examined using interpreter [id number].  T-max 101.6 overnight.  Reports persistent cough with pain in her chest.  Started Tussionex.  Also reports abdominal distention.  At the time of this visit declined paracentesis and possible peritoneal biopsy.  Assessment/Plan: Principal Problem:   Loculated pleural effusion Active Problems:   Recurrent UTI   Abdominal ascites   Normocytic anemia   Moderate protein-calorie malnutrition (HCC)   Fever   Biological false positive RPR test  Suspected disseminated tuberculosis Independently reviewed CT chest 12/16  which showed large likely loculated left sided pleural effusion, bilateral pulmonary nodules, bronchiectasis upper lobes with areas of calcification.  CT abdomen and pelvis also showed concern for disseminated tuberculosis. QuantiFERON gold plus nonreactive AFB smear negative for peritoneal fluid and for pleural fluid. Acid-fast culture in process.  Continue to follow cultures. Seen by infectious disease, following. Continue to hold off antibiotics for now as recommended by infectious disease. Cytology non Pap for thoracentesis no malignant cells found Cytology non Pap for paracentesis no malignant cells found  Recurrent fevers, suspect infection related with possible disseminated TB Febrile with T-max 101.6 overnight. Tachycardia has improved. Blood CX x2 obtained on 12/19 negative to date.  Continue to follow cultures. Continue symptomatic treatment.  False positive syphilis test Non treponemal test RPR reactive Treponemal test non reactive, titer unremarkable. HIV nonreactive  Resolved hypotension Continue normal saline at 75 cc/h Continue to maintain MAP greater than 65  Normocytic anemia Hemoglobin stable 9.6 from 9.2 MCV 82.  Loculated left pleural effusion status post left thoracentesis 12/17 800 cc fluid removed post left thoracentesis 12/17 Gram stain no organisms seen Follow cultures as stated above.  Abdominal ascites post paracentesis 12/18 1.3 L of peritoneal fluid removed post paracentesis 12/18 Acute hepatitis panel negative Follow cultures as stated above.  Severe protein calorie malnutrition with muscular wasting Albumin 2.7 BMI 18 Continue oral supplement  DVT prophylaxis: SCDs and subcu Lovenox daily. Code Status: Full code Family Communication: None Disposition Plan: Discharge home after diagnosis   Consultants:   None  Procedures:   Thoracentesis, 12/17  Paracentesis, 12/18  Antimicrobials:   None     Objective: Vitals:    01/29/19 0031 01/29/19 0629 01/29/19 0753 01/29/19  1327  BP:  121/83 116/73 113/79  Pulse: 89 (!) 105 97 89  Resp: (!) 22 (!) Temp: 98.7 F (37.1 C) 99.2 F (37.3 C) (!) 100.4 F (38 C) 98.4 F (36.9 C)  TempSrc: Oral Oral Oral Oral  SpO2:  93% 96% 96%  Weight:      Height:        Intake/Output Summary (Last 24 hours) at 01/29/2019 1545 Last data filed at 01/29/2019 0700 Gross per 24 hour  Intake 0 ml  Output --  Net 0 ml   Filed Weights   01/24/19 2241  Weight: 48 kg    Exam:  . General: 28 y.o. year-old female frail-appearing no acute distress.  Alert oriented x3.  Cardiovascular: Regular rate and rhythm no rubs or gallops.   Marland Kitchen Respiratory: Mild rales at bases no wheezing noted.  Poor inspiratory effort.   . Abdomen: Distended abdomen with bowel sounds present.   . Musculoskeletal: No lower extremity edema. Psychiatry: Mood is appropriate for condition and setting.  Data Reviewed: CBC: Recent Labs  Lab 01/24/19 0450 01/25/19 1307 01/26/19 0714 01/28/19 0325 01/28/19 0729 01/29/19 0554  WBC 6.5 7.6 6.3 6.3  --  6.7  NEUTROABS 4.5 6.1 4.2  --   --  4.9  HGB 10.7* 11.1* 10.8* 8.1* 9.2* 9.6*  HCT 32.4* 34.1* 32.7* 24.6* 27.5* 29.4*  MCV 82.7 82.8 81.5 82.0  --  82.6  PLT 645* 694* 644* 530*  --  556*   Basic Metabolic Panel: Recent Labs  Lab 01/24/19 0450 01/25/19 1307 01/26/19 0714 01/28/19 0325 01/29/19 0721  NA 136 135 136 139 138  K 3.2* 4.2 4.3 3.5 3.4*  CL 101 105 106 108 112*  CO2 22 21* 21* 19* 20*  GLUCOSE 82 93 90 90 95  BUN 8 <5* <5* <5* 5*  CREATININE 0.69 0.50 0.61 0.55 0.55  CALCIUM 8.6* 8.4* 8.3* 8.7* 8.4*   GFR: Estimated Creatinine Clearance: 80 mL/min (by C-G formula based on SCr of 0.55 mg/dL). Liver Function Tests: Recent Labs  Lab 01/23/19 1635 01/24/19 0450 01/25/19 1307 01/28/19 0325 01/29/19 0721  AST ALT ALKPHOS 43 39 41 33* 33*  BILITOT 0.4 0.6 0.3 0.4 0.5  PROT 8.3*  7.7 7.0 7.1 6.1*  ALBUMIN 3.1* 3.0* 2.7* 4.6 3.4*   Recent Labs  Lab 01/23/19 1635  LIPASE 42   No results for input(s): AMMONIA in the last 168 hours. Coagulation Profile: Recent Labs  Lab 01/29/19 0721  INR 1.3*   Cardiac Enzymes: No results for input(s): CKTOTAL, CKMB, CKMBINDEX, TROPONINI in the last 168 hours. BNP (last 3 results) No results for input(s): PROBNP in the last 8760 hours. HbA1C: No results for input(s): HGBA1C in the last 72 hours. CBG: No results for input(s): GLUCAP in the last 168 hours. Lipid Profile: No results for input(s): CHOL, HDL, LDLCALC, TRIG, CHOLHDL, LDLDIRECT in the last 72 hours. Thyroid Function Tests: No results for input(s): TSH, T4TOTAL, FREET4, T3FREE, THYROIDAB in the last 72 hours. Anemia Panel: No results for input(s): VITAMINB12, FOLATE, FERRITIN, TIBC, IRON, RETICCTPCT in the last 72 hours. Urine analysis:    Component Value Date/Time   COLORURINE YELLOW 01/24/2019 0038   APPEARANCEUR HAZY (A) 01/24/2019 0038   LABSPEC 1.020 01/24/2019 0038   PHURINE 5.0 01/24/2019 0038   GLUCOSEU NEGATIVE 01/24/2019 0038   HGBUR SMALL (A) 01/24/2019 0038   BILIRUBINUR NEGATIVE 01/24/2019 0038  KETONESUR 5 (A) 01/24/2019 0038   PROTEINUR NEGATIVE 01/24/2019 0038   NITRITE NEGATIVE 01/24/2019 0038   LEUKOCYTESUR NEGATIVE 01/24/2019 0038   Sepsis Labs: @LABRCNTIP (procalcitonin:4,lacticidven:4)  ) Recent Results (from the past 240 hour(s))  Blood culture (routine x 2)     Status: None   Collection Time: 01/24/19  4:38 AM   Specimen: BLOOD RIGHT HAND  Result Value Ref Range Status   Specimen Description BLOOD RIGHT HAND  Final   Special Requests   Final    BOTTLES DRAWN AEROBIC AND ANAEROBIC Blood Culture results may not be optimal due to an inadequate volume of blood received in culture bottles   Culture   Final    NO GROWTH 5 DAYS Performed at [facility], [address] [place], [place] [address]    Report Status  01/29/2019 FINAL  Final  Blood culture (routine x 2)     Status: None   Collection Time: 01/24/19  4:38 AM   Specimen: BLOOD  Result Value Ref Range Status   Specimen Description BLOOD LEFT ANTECUBITAL  Final   Special Requests   Final    BOTTLES DRAWN AEROBIC AND ANAEROBIC Blood Culture adequate volume   Culture   Final    NO GROWTH 5 DAYS Performed at [facility], [address] [place], [place] [address]    Report Status 01/29/2019 FINAL  Final  SARS CORONAVIRUS 2 (TAT 6-24 HRS) Nasopharyngeal Nasopharyngeal Swab     Status: None   Collection Time: 01/24/19  8:53 AM   Specimen: Nasopharyngeal Swab  Result Value Ref Range Status   SARS Coronavirus 2 NEGATIVE NEGATIVE Final    Comment: (NOTE) SARS-CoV-2 target nucleic acids are NOT DETECTED. The SARS-CoV-2 RNA is generally detectable in upper and lower respiratory specimens during the acute phase of infection. Negative results do not preclude SARS-CoV-2 infection, do not rule out co-infections with other pathogens, and should not be used as the sole basis for treatment or other patient management decisions. Negative results must be combined with clinical observations, patient history, and epidemiological information. The expected result is Negative. Fact Sheet for Patients: HairSlick.nohttps://www.fda.gov/media/138098/download Fact Sheet for Healthcare Providers: quierodirigir.comhttps://www.fda.gov/media/138095/download This test is not yet approved or cleared by the [place] FDA and  has been authorized for detection and/or diagnosis of SARS-CoV-2 by FDA under an Emergency Use Authorization (EUA). This EUA will remain  in effect (meaning this test can be used) for the duration of the COVID-19 declaration under Section 56 4(b)(1) of the Act, 21 U.S.C. section 360bbb-3(b)(1), unless the authorization is terminated or revoked sooner. Performed at [facility], [address] [place], [place] [address]   Gram stain     Status: None    Collection Time: 01/25/19  1:53 PM   Specimen: Lung, Left; Pleural Fluid  Result Value Ref Range Status   Specimen Description PLEURAL LEFT  Final   Special Requests NONE  Final   Gram Stain   Final    ABUNDANT WBC PRESENT, PREDOMINANTLY MONONUCLEAR NO ORGANISMS SEEN Performed at [facility], [address] [place], [place] [address]    Report Status 01/25/2019 FINAL  Final  Acid Fast Smear (AFB)     Status: None   Collection Time: 01/25/19  1:53 PM   Specimen: Lung, Left; Pleural Fluid  Result Value Ref Range Status   AFB Specimen Processing Concentration  Final   Acid Fast Smear Negative  Final    Comment: (NOTE) Performed At: [facility]  [facility] [address] [place], [place] [id number] Jolene Schimke MD DX:[id number]    Source (AFB) PLEURAL  Final    Comment: LEFT Performed at [facility], [address] [place], [place] [address]   Culture, body fluid-bottle     Status: None (Preliminary result)   Collection Time: 01/25/19  1:53 PM   Specimen: Pleura  Result Value Ref Range Status   Specimen Description PLEURAL LEFT  Final   Special Requests NONE  Final   Culture   Final    NO GROWTH 4 DAYS Performed at [facility], [address] [place], [place] [address]    Report Status PENDING  Incomplete  Gram stain     Status: None   Collection Time: 01/26/19  3:41 PM   Specimen: Abdomen; Peritoneal Fluid  Result Value Ref Range Status   Specimen Description FLUID PERITONEAL  Final   Special Requests NONE  Final   Gram Stain   Final    NO WBC SEEN NO ORGANISMS SEEN Performed at [facility], [address], [place], [place] [address]    Report Status 01/26/2019 FINAL  Final  Acid Fast Smear (AFB)     Status: None   Collection Time: 01/26/19  3:41 PM   Specimen: Abdomen; Peritoneal Fluid  Result Value Ref Range Status   AFB Specimen Processing Concentration  Final   Acid Fast Smear Negative  Final    Comment: (NOTE) Performed At: [facility] [address] [place], [place] [id number] Jolene Schimke MD TW:[id number]    Source (AFB) FLUID  Final    Comment: PERITONEAL Performed at [facility], [address] [place], [place] [address]   Culture, body fluid-bottle     Status: None (Preliminary result)   Collection Time: 01/26/19  3:41 PM   Specimen: Fluid  Result Value Ref Range Status   Specimen Description FLUID PERITONEAL  Final   Special Requests BOTTLES DRAWN AEROBIC AND ANAEROBIC 10CC  Final   Culture   Final    NO GROWTH 3 DAYS Performed at [facility], [address], [place], [place] [address]    Report Status PENDING  Incomplete  Culture, blood (routine x 2)     Status: None (Preliminary result)   Collection Time: 01/27/19  4:45 PM   Specimen: BLOOD  Result Value Ref Range Status   Specimen Description BLOOD LEFT ANTECUBITAL  Final   Special Requests   Final    BOTTLES DRAWN AEROBIC AND ANAEROBIC Blood Culture adequate volume   Culture   Final    NO GROWTH 2 DAYS Performed at [facility], [address], [place], [place] [address]    Report Status PENDING  Incomplete  Culture, blood (routine x 2)     Status: None (Preliminary result)   Collection Time: 01/27/19  4:53 PM   Specimen: BLOOD LEFT HAND  Result Value Ref Range Status   Specimen Description BLOOD LEFT HAND  Final   Special Requests   Final    BOTTLES DRAWN AEROBIC AND ANAEROBIC Blood Culture adequate volume   Culture   Final    NO GROWTH 2 DAYS Performed at [facility], [address], [place], [place] [address]    Report Status PENDING  Incomplete      Studies: No results found.  Scheduled Meds: . chlorpheniramine-HYDROcodone  5 mL Oral Q12H  . [START ON 01/30/2019] enoxaparin (LOVENOX) injection  40 mg Subcutaneous  Daily  . lidocaine      . Ensure Max Protein  11 oz Oral Daily    Continuous Infusions: . sodium chloride 75 mL/hr at 01/27/19 0907     LOS: 5 days     Darlin Drop, MD [facility] Pager [phone]  If 7PM-7AM, please contact night-coverage [email or web address] Password [email or web address] 01/29/2019, 3:45 PM

## 2019-01-29 NOTE — Progress Notes (Signed)
Subjective: Complaining of worsening abdominal pain and coughing chest pain   Antibiotics:  Anti-infectives (From admission, onward)   None      Medications: Scheduled Meds: . chlorpheniramine-HYDROcodone  5 mL Oral Q12H  . [START ON 01/30/2019] enoxaparin (LOVENOX) injection  40 mg Subcutaneous Daily  . lidocaine      . potassium chloride  20 mEq Oral TID  . Ensure Max Protein  11 oz Oral Daily   Continuous Infusions: . sodium chloride 75 mL/hr at 01/27/19 0907   PRN Meds:.acetaminophen **OR** acetaminophen, HYDROcodone-acetaminophen, lidocaine (PF), lidocaine, ondansetron **OR** ondansetron (ZOFRAN) IV, polyethylene glycol    Objective: Weight change:   Intake/Output Summary (Last 24 hours) at 01/29/2019 1632 Last data filed at 01/29/2019 1500 Gross per 24 hour  Intake 3883.31 ml  Output --  Net 3883.31 ml   Blood pressure 113/79, pulse 89, temperature 98.4 F (36.9 C), temperature source Oral, resp. rate 19, height 5\' 3"  (1.6 m), weight 48 kg, last menstrual period 01/09/2019, SpO2 96 %. Temp:  [98.4 F (36.9 C)-101.6 F (38.7 C)] 98.4 F (36.9 C) (12/21 1327) Pulse Rate:  [89-105] 89 (12/21 1327) Resp:  [19-28] 19 (12/21 1327) BP: (99-121)/(73-83) 113/79 (12/21 1327) SpO2:  [93 %-97 %] 96 % (12/21 1327)  Physical Exam: General: Alert and awake, oriented x3, not in any acute distress. HEENT: anicteric sclera, EOMI CVS regular rate, normal  Chest: , no wheezing, no respiratory distress Abdomen: soft non-distended,  Extremities: no edema or deformity noted bilaterally Skin: no rashes Neuro: nonfocal  CBC:    BMET Recent Labs    01/28/19 0325 01/29/19 0721  NA 139 138  K 3.5 3.4*  CL 108 112*  CO2 19* 20*  GLUCOSE 90 95  BUN <5* 5*  CREATININE 0.55 0.55  CALCIUM 8.7* 8.4*     Liver Panel  Recent Labs    01/28/19 0325 01/29/19 0721  PROT 7.1 6.1*  ALBUMIN 4.6 3.4*  AST 21 30  ALT 11 17  ALKPHOS 33* 33*  BILITOT 0.4  0.5       Sedimentation Rate No results for input(s): ESRSEDRATE in the last 72 hours. C-Reactive Protein No results for input(s): CRP in the last 72 hours.  Micro Results: Recent Results (from the past 720 hour(s))  Blood culture (routine x 2)     Status: None   Collection Time: 01/24/19  4:38 AM   Specimen: BLOOD RIGHT HAND  Result Value Ref Range Status   Specimen Description BLOOD RIGHT HAND  Final   Special Requests   Final    BOTTLES DRAWN AEROBIC AND ANAEROBIC Blood Culture results may not be optimal due to an inadequate volume of blood received in culture bottles   Culture   Final    NO GROWTH 5 DAYS Performed at [facility], [address] [place], [place] [address]    Report Status 01/29/2019 FINAL  Final  Blood culture (routine x 2)     Status: None   Collection Time: 01/24/19  4:38 AM   Specimen: BLOOD  Result Value Ref Range Status   Specimen Description BLOOD LEFT ANTECUBITAL  Final   Special Requests   Final    BOTTLES DRAWN AEROBIC AND ANAEROBIC Blood Culture adequate volume   Culture   Final    NO GROWTH 5 DAYS Performed at [facility], [address] [place], [place] [address]    Report Status 01/29/2019 FINAL  Final  SARS  CORONAVIRUS 2 (TAT 6-24 HRS) Nasopharyngeal Nasopharyngeal Swab     Status: None   Collection Time: 01/24/19  8:53 AM   Specimen: Nasopharyngeal Swab  Result Value Ref Range Status   SARS Coronavirus 2 NEGATIVE NEGATIVE Final    Comment: (NOTE) SARS-CoV-2 target nucleic acids are NOT DETECTED. The SARS-CoV-2 RNA is generally detectable in upper and lower respiratory specimens during the acute phase of infection. Negative results do not preclude SARS-CoV-2 infection, do not rule out co-infections with other pathogens, and should not be used as the sole basis for treatment or other patient management decisions. Negative results must be combined with clinical observations, patient history, and epidemiological  information. The expected result is Negative. Fact Sheet for Patients: HairSlick.no Fact Sheet for Healthcare Providers: quierodirigir.com This test is not yet approved or cleared by the [place] FDA and  has Hurst authorized for detection and/or diagnosis of SARS-CoV-2 by FDA under an Emergency Use Authorization (EUA). This EUA will remain  in effect (meaning this test can be used) for the duration of the COVID-19 declaration under Section 56 4(b)(1) of the Act, 21 U.S.C. section 360bbb-3(b)(1), unless the authorization is terminated or revoked sooner. Performed at [facility], [address], [place], [place] [address]   Gram stain     Status: None   Collection Time: 01/25/19  1:53 PM   Specimen: Lung, Left; Pleural Fluid  Result Value Ref Range Status   Specimen Description PLEURAL LEFT  Final   Special Requests NONE  Final   Gram Stain   Final    ABUNDANT WBC PRESENT, PREDOMINANTLY MONONUCLEAR NO ORGANISMS SEEN Performed at [facility], [address] [place], [place] [address]    Report Status 01/25/2019 FINAL  Final  Acid Fast Smear (AFB)     Status: None   Collection Time: 01/25/19  1:53 PM   Specimen: Lung, Left; Pleural Fluid  Result Value Ref Range Status   AFB Specimen Processing Concentration  Final   Acid Fast Smear Negative  Final    Comment: (NOTE) Performed At: [facility] [address] [place], [place] [id number] Jolene Schimke MD UT:[id number]    Source (AFB) PLEURAL  Final    Comment: LEFT Performed at [facility], [address], [place], [place] [address]   Culture, body fluid-bottle     Status: None (Preliminary result)   Collection Time: 01/25/19  1:53 PM   Specimen: Pleura  Result Value Ref Range Status   Specimen Description PLEURAL LEFT  Final   Special Requests NONE  Final   Culture   Final    NO GROWTH 4 DAYS Performed at [facility], [address] [place], [place] [address]    Report Status PENDING  Incomplete  Gram stain     Status: None   Collection Time: 01/26/19  3:41 PM   Specimen: Abdomen; Peritoneal Fluid  Result Value Ref Range Status   Specimen Description FLUID PERITONEAL  Final   Special Requests NONE  Final   Gram Stain   Final    NO WBC SEEN NO ORGANISMS SEEN Performed at [facility], [address], [place], [place] [address]    Report Status 01/26/2019 FINAL  Final  Acid Fast Smear (AFB)     Status: None   Collection Time: 01/26/19  3:41 PM   Specimen: Abdomen; Peritoneal Fluid  Result Value Ref Range Status   AFB Specimen Processing Concentration  Final  Acid Fast Smear Negative  Final    Comment: (NOTE) Performed At: [facility] [place], [place] [id number] Rush Farmer MD QP:[id number]    Source (AFB) FLUID  Final    Comment: PERITONEAL Performed at [facility], [place] [address], [place], [place] [address]   Culture, body fluid-bottle     Status: None (Preliminary result)   Collection Time: 01/26/19  3:41 PM   Specimen: Fluid  Result Value Ref Range Status   Specimen Description FLUID PERITONEAL  Final   Special Requests BOTTLES DRAWN AEROBIC AND ANAEROBIC 10CC  Final   Culture   Final    NO GROWTH 3 DAYS Performed at [facility], [place] [address] [place] [address], [place], [place] [address]    Report Status PENDING  Incomplete  Culture, blood (routine x 2)     Status: None (Preliminary result)   Collection Time: 01/27/19  4:45 PM   Specimen: BLOOD  Result Value Ref Range Status   Specimen Description BLOOD LEFT ANTECUBITAL  Final   Special Requests   Final    BOTTLES DRAWN AEROBIC AND ANAEROBIC Blood Culture adequate volume   Culture   Final    NO GROWTH 2 DAYS Performed at [facility], [place] [address], [place], [place] [address]    Report Status PENDING  Incomplete  Culture, blood (routine x 2)     Status: None (Preliminary result)   Collection  Time: 01/27/19  4:53 PM   Specimen: BLOOD LEFT HAND  Result Value Ref Range Status   Specimen Description BLOOD LEFT HAND  Final   Special Requests   Final    BOTTLES DRAWN AEROBIC AND ANAEROBIC Blood Culture adequate volume   Culture   Final    NO GROWTH 2 DAYS Performed at [facility], [place] [address] [place], [place] [address]    Report Status PENDING  Incomplete    Studies/Results: No results found.    Assessment/Plan:  INTERVAL HISTORY: Nancy Hurst did not find sufficient fluid to aspirate for studies.  There is also no target for biopsy   Principal Problem:   Loculated pleural effusion Active Problems:   Recurrent UTI   Abdominal ascites   Normocytic anemia   Moderate protein-calorie malnutrition (HCC)   Fever   Biological false positive RPR test    Nancy Hurst is a 28 y.o. female with high fevers lymphocytic exudative pleural effusion and peritonitis concerning for extrapulmonary tuberculosis.  Apparently IR could not safely perform paracentesis or thoracentesis.  Given that this is the case I think that it may be more reasonable to offer her a therapeutic trial of empiric treatment for extrapulmonary tuberculosis.  I will discuss this with her in the morning and also get in touch with the health department with Nancy Hurst  I spent greater than 40 minutes with the patient including greater than 50% of time in face to face counsel of the patient using telephonic translation and in coordination of her care with interventional radiology.    LOS: 5 days   Alcide Evener 01/29/2019, 4:32 PM

## 2019-01-29 NOTE — H&P (Signed)
Chief Complaint: Fever with pleural effusion concern for extra pulmonary tuberculosis  Referring Physician(s): Dr, Delma Post  Supervising Physician: Malachy Moan  Patient Status: [facility] - In-pt  History of Present Illness: Nancy Hurst is a 28 y.o. female. Presented to [facility] ED with cough and pleuritic chest pain. Team is concerned for possible extra pulmonary tuberculosis. Team's original procedure request was for a omental or peritoneal biopsy however after review of imaging recommend by IR Attending there is no definitive biopsy site. Recommend diagnostic paracentesis if patient has fluid re accumulation.   History reviewed. No pertinent past medical history.  Past Surgical History:  Procedure Laterality Date   BREAST SURGERY     left breast   IR PARACENTESIS  01/26/2019   IR THORACENTESIS ASP PLEURAL SPACE W/IMG GUIDE  01/25/2019   LAPAROSCOPIC APPENDECTOMY N/A 11/03/2017   Procedure: APPENDECTOMY LAPAROSCOPIC;  Surgeon: Almond Lint, MD;  Location: [facility] ORS;  Service: General;  Laterality: N/A;    Allergies: Patient has no known allergies.  Medications: Prior to Admission medications   Medication Sig Start Date End Date Taking? Authorizing Provider  acetaminophen (TYLENOL) 500 MG tablet Take 1,000 mg by mouth every 6 (six) hours as needed for mild pain.   Yes [provider]  doxycycline (VIBRAMYCIN) 100 MG capsule Take 100 mg by mouth 2 (two) times daily. 01/23/19  Yes [provider]     Family History  Family history unknown: Yes    Social History   Socioeconomic History   Marital status: Single    Spouse name: Not on file   Number of children: Not on file   Years of education: Not on file   Highest education level: Not on file  Occupational History   Not on file  Tobacco Use   Smoking status: Never Smoker   Smokeless tobacco: Never Used  Substance and Sexual Activity   Alcohol use: No   Drug use: No    Sexual activity: Yes    Birth control/protection: None  Other Topics Concern   Not on file  Social History Narrative   ** Merged History Encounter **       Social Determinants of Health   Financial Resource Strain:    Difficulty of Paying Living Expenses: Not on file  Food Insecurity:    Worried About Programme researcher, broadcasting/film/video in the Last Year: Not on file   [employer] of Food in the Last Year: Not on file  Transportation Needs: No Transportation Needs   Lack of Transportation (Medical): No   Lack of Transportation (Non-Medical): No  Physical Activity:    Days of Exercise per Week: Not on file   Minutes of Exercise per Session: Not on file  Stress:    Feeling of Stress : Not on file  Social Connections:    Frequency of Communication with Friends and Family: Not on file   Frequency of Social Gatherings with Friends and Family: Not on file   Attends Religious Services: Not on file   Active Member of Clubs or Organizations: Not on file   Attends Banker Meetings: Not on file   Marital Status: Not on file    Vital Signs: BP 116/73 (BP Location: Left Arm)    Pulse 97    Temp (!) 100.4 F (38 C) (Oral)    Resp 20    Ht  (1.6 m)    Wt 105 lb 12.8 oz (48 kg)    LMP 01/09/2019  SpO2 96%    BMI 18.74 kg/m   Imaging: DG Chest 1 View  Result Date: 01/25/2019 CLINICAL DATA:  Follow-up left-sided pleural effusion. Status post thoracentesis EXAM: CHEST  1 VIEW COMPARISON:  Chest x-ray 01/23/2019 and CT scan 01/24/2019 FINDINGS: Interval evacuation of the left pleural effusion. No significant residual pleural fluid is identified. There is moderate left lower lobe atelectasis. Persistent nodular interstitial process in the lungs. No postprocedural pneumothorax. IMPRESSION: 1. Evacuation of left pleural effusion status post thoracentesis. No postprocedural pneumothorax. 2. Stable diffuse nodular interstitial process in the lungs. 3. Left lower lobe atelectasis.  Electronically Signed   By: Rudie Meyer M.D.   On: 01/25/2019 17:14   DG Chest 2 View  Result Date: 01/23/2019 CLINICAL DATA:  Chest pain EXAM: CHEST - 2 VIEW COMPARISON:  January 22, 2019 FINDINGS: There is a persistent left pleural effusion. There is persistent scarring in the upper lobe regions with ill-defined opacity in the left apex, stable. No new opacity evident. Heart size and pulmonary vascularity are normal. No adenopathy appreciable. No bone lesions. IMPRESSION: Persistent left pleural effusion. Scarring in the upper lobe regions with asymmetric opacity in the left upper lobe toward the apex. Question focal pneumonia in this area. Given the asymmetry in the left apex compared to the right, correlation with chest CT, ideally with intravenous contrast, may be advised to further evaluate. Heart size normal.  No adenopathy evident by radiography. Electronically Signed   By: Bretta Bang III M.D.   On: 01/23/2019 17:05   DG Chest 2 View  Result Date: 01/22/2019 CLINICAL DATA:  Back pain, cough EXAM: CHEST - 2 VIEW COMPARISON:  None. FINDINGS: The heart size and mediastinal contours are within normal limits. Moderate left pleural effusion and associated atelectasis or consolidation. The visualized skeletal structures are unremarkable. IMPRESSION: Moderate left pleural effusion and associated atelectasis or consolidation. Electronically Signed   By: Lauralyn Primes M.D.   On: 01/22/2019 13:12   CT Chest W Contrast  Result Date: 01/24/2019 CLINICAL DATA:  Pleural effusion. Cough. EXAM: CT CHEST WITH CONTRAST TECHNIQUE: Multidetector CT imaging of the chest was performed during intravenous contrast administration. CONTRAST:  74mL OMNIPAQUE IOHEXOL 300 MG/ML  SOLN COMPARISON:  CT of the pelvis dated 11/03/2017. Chest x-ray from same day. FINDINGS: Cardiovascular: There is no large centrally located pulmonary embolism. Detection of smaller pulmonary emboli is limited by technique and contrast  timing. The heart size is normal. There is a trace pericardial effusion. There is no thoracic aortic aneurysm or dissection. Mediastinum/Nodes: There is partial collapse of the left upper lobe. There is bilateral upper lobe bronchiectasis. --there are slightly prominent hilar and mediastinal lymph nodes, most notably involving the left hilum. --No axillary lymphadenopathy. --No supraclavicular lymphadenopathy. --Normal thyroid gland. --The esophagus is unremarkable Lungs/Pleura: There is a large likely loculated left-sided pleural effusion. There is bronchiectasis and architectural distortion of the bilateral upper lobes. There are areas of calcification within the bilateral upper lobes. There are pulmonary nodules bilaterally measuring up to approximately 1.4 cm. There is consolidation in the bilateral upper lobes. There appear to be bilateral micro nodules there is no pneumothorax. Upper Abdomen: The upper abdomen demonstrates a moderate to large volume of abdominal ascites. There appears to be infiltration of the omentum with possible thickening of the peritoneal lining. There are possible calcified lymph nodes in the upper abdomen. Musculoskeletal: No chest wall abnormality. No acute or significant osseous findings. Review of the MIP images confirms the above findings. IMPRESSION: 1. There  is no large centrally located pulmonary embolism. Detection of smaller pulmonary emboli is limited by technique and contrast timing. 2. Large likely loculated left-sided pleural effusion. 3. Sequela of findings in the lungs as detailed above that favor an atypical infectious process such as advanced tuberculosis. Correlation with laboratory studies is recommended. Follow-up is recommended given the presence of pulmonary nodules as detailed above. 4. Moderate to large volume of abdominal ascites with infiltration of the omentum with possible thickening of the peritoneal lining. Findings raise concern for a process such as TB  peritonitis in the appropriate clinical setting. Follow-up with a contrast-enhanced CT of the abdomen is recommended for further evaluation. 5. Trace pericardial effusion. These results were called by telephone at the time of interpretation on 01/24/2019 at 2:09 am to provider [facility] , who verbally acknowledged these results. Electronically Signed   By: Constance Holster M.D.   On: 01/24/2019 02:10   CT ABDOMEN PELVIS W CONTRAST  Result Date: 01/24/2019 CLINICAL DATA:  Peritonitis EXAM: CT ABDOMEN AND PELVIS WITH CONTRAST TECHNIQUE: Multidetector CT imaging of the abdomen and pelvis was performed using the standard protocol following bolus administration of intravenous contrast. CONTRAST:  39mL OMNIPAQUE IOHEXOL 300 MG/ML  SOLN COMPARISON:  None. FINDINGS: Lower chest: Again noted is a large loculated left-sided pleural effusion with some pleural thickening at the lung base. Hepatobiliary: The liver is normal. Normal gallbladder.There is no biliary ductal dilation. Pancreas: Normal contours without ductal dilatation. No peripancreatic fluid collection. Spleen: No splenic laceration or hematoma. Adrenals/Urinary Tract: --Adrenal glands: No adrenal hemorrhage. --Right kidney/ureter: The appearance of the kidney is slightly striated which may be secondary to back to back contrast-enhanced studies. --Left kidney/ureter: The appearance of the kidney is striated which may be secondary to back to back contrast-enhanced studies. --Urinary bladder: Unremarkable. Stomach/Bowel: --Stomach/Duodenum: There is some wall thickening of the distal gastric body and gastric antrum. --Small bowel: There are slightly dilated loops of small bowel scattered throughout the abdomen. Many of the small bowel loops appear somewhat matted. --Colon: No focal abnormality. --Appendix: Surgically absent. Vascular/Lymphatic: Normal course and caliber of the major abdominal vessels. --there are no pathologically enlarged retroperitoneal  lymph nodes. There are few mildly enlarged but subcentimeter lymph nodes noted in the retroperitoneal space. --there are multiple enlarged low-attenuation mesenteric lymph nodes. These are best appreciated on the coronal series. The largest measures approximately 1.4 cm. --there are enlarged pelvic lymph nodes. For example there is a 1.1 cm right pelvic sidewall lymph node (axial series 3, image 68). Reproductive: On the coronal series, there is a fluid-filled somewhat serpiginous structure in the patient's left hemipelvis. There is amorphous soft tissue within the patient's pelvis. The ovaries are somewhat indistinct. The uterus is unremarkable. Other: There is a moderate to large volume of free fluid in the patient's abdomen. This free fluid measures above water density, however this measurement may not be reliable as the gallbladder also measures above water density. There is diffuse caking of the omentum. There is edema within the mesentery and thickening of the peritoneal lining. Musculoskeletal. There is no acute displaced fracture. No dislocation IMPRESSION: 1. Multiple findings as detailed above favor a diagnosis of TB peritonitis versus less likely peritoneal carcinomatosis given the findings in the lungs. The findings include a moderate to large volume of free fluid, omental caking, and enlarged likely caseating lymph nodes among other findings. Follow-up is recommended. 2. Moderate to large volume somewhat hyperdense free fluid in the abdomen. This is amenable to percutaneous sampling. 3.  Possible wall thickening of the gastric antrum. Findings may be secondary to GI involvement of TB. 4. Serpiginous cystic appearing structure in the patient's left hemipelvis raises concern for left-sided hydrosalpinx or pyosalpinx. This can be further evaluated with a pelvic ultrasound. 5. Amorphous soft tissue in the patient's pelvis with indistinct ovaries. Attention on follow-up examinations is recommended and/or  follow-up with pelvic ultrasound is recommended. 6. Again noted is a large likely loculated left-sided pleural effusion with pleural thickening at the left lung base. This is amenable to percutaneous sampling as clinically indicated. These results were called by telephone at the time of interpretation on 01/24/2019 at 3:32 am to provider [facility] , who verbally acknowledged these results. Electronically Signed   By: Katherine Mantle M.D.   On: 01/24/2019 03:33   IR Paracentesis  Result Date: 01/26/2019 INDICATION: Patient with history of dyspnea, probable TB, loculated pleural effusion s/p thoracentesis in IR 01/25/2019, abdominal distension, and ascites. Request is made for diagnostic and therapeutic paracentesis. EXAM: ULTRASOUND GUIDED DIAGNOSTIC AND THERAPEUTIC PARACENTESIS MEDICATIONS: 7 mL 1% lidocaine COMPLICATIONS: None immediate. PROCEDURE: Informed written consent was obtained from the patient after a discussion of the risks, benefits and alternatives to treatment. A timeout was performed prior to the initiation of the procedure. Initial ultrasound scanning demonstrates a moderate amount of ascites within the left lower abdominal quadrant. The left lower abdomen was prepped and draped in the usual sterile fashion. 1% lidocaine was used for local anesthesia. Following this, a 19 gauge, 7-cm, Yueh catheter was introduced. An ultrasound image was saved for documentation purposes. The paracentesis was performed. The catheter was removed and a dressing was applied. The patient tolerated the procedure well without immediate post procedural complication. FINDINGS: A total of approximately 1.3 L of clear gold fluid was removed. Samples were sent to the laboratory as requested by the clinical team. IMPRESSION: Successful ultrasound-guided paracentesis yielding 1.3 L of peritoneal fluid. Spanish language medical interpreter was used during today's procedure. Read by: Elwin Mocha, PA-C Electronically  Signed   By: Irish Lack M.D.   On: 01/26/2019 15:44   IR THORACENTESIS ASP PLEURAL SPACE W/IMG GUIDE  Result Date: 01/25/2019 INDICATION: Cough and shortness of breath. Left pleural effusion. Rule out tuberculosis. Request for diagnostic and therapeutic thoracentesis. EXAM: ULTRASOUND GUIDED LEFT THORACENTESIS MEDICATIONS: 1% lidocaine 8 mL COMPLICATIONS: None immediate. PROCEDURE: An ultrasound guided thoracentesis was thoroughly discussed with the patient and questions answered. The benefits, risks, alternatives and complications were also discussed. The patient understands and wishes to proceed with the procedure. Written consent was obtained. Ultrasound was performed to localize and mark an adequate pocket of fluid in the left chest. The area was then prepped and draped in the normal sterile fashion. 1% Lidocaine was used for local anesthesia. Under ultrasound guidance a 6 Fr Safe-T-Centesis catheter was introduced. Thoracentesis was performed. The catheter was removed and a dressing applied. FINDINGS: A total of approximately 800 mL of clear yellow fluid was removed. Samples were sent to the laboratory as requested by the clinical team. IMPRESSION: Successful ultrasound guided left thoracentesis yielding 800 mL of pleural fluid. Read by: Corrin Parker, PA-C Electronically Signed   By: Irish Lack M.D.   On: 01/25/2019 16:20    Labs:  CBC: Recent Labs    01/25/19 1307 01/26/19 0714 01/28/19 0325 01/28/19 0729 01/29/19 0554  WBC 7.6 6.3 6.3  --  6.7  HGB 11.1* 10.8* 8.1* 9.2* 9.6*  HCT 34.1* 32.7* 24.6* 27.5* 29.4*  PLT 694* 644* 530*  --  556*    COAGS: Recent Labs    01/29/19 0721  INR 1.3*    BMP: Recent Labs    01/24/19 0450 01/25/19 1307 01/26/19 0714 01/28/19 0325  NA 136 135 136 139  K 3.2* 4.2 4.3 3.5  CL 101 105 106 108  CO2 22 21* 21* 19*  GLUCOSE 82 93 90 90  BUN 8 <5* <5* <5*  CALCIUM 8.6* 8.4* 8.3* 8.7*  CREATININE 0.69 0.50 0.61 0.55  GFRNONAA >60  >60 >60 >60  GFRAA >60 >60 >60 >60    LIVER FUNCTION TESTS: Recent Labs    01/23/19 1635 01/24/19 0450 01/25/19 1307 01/28/19 0325  BILITOT 0.4 0.6 0.3 0.4  AST 20 18 18 21   ALT 13 11 11 11   ALKPHOS 43 39 41 33*  PROT 8.3* 7.7 7.0 7.1  ALBUMIN 3.1* 3.0* 2.7* 4.6    TUMOR MARKERS: No results for input(s): AFPTM, CEA, CA199, CHROMGRNA in the last 8760 hours.  Assessment and Plan:  28 y.o, female inpatient. No known history presented [facility] with cough and pleuritic chest pain. Team is concerned for extrapulmonary TB. Team is requesting omental or peritoneal biopsy for further determination .  Pertinent Imaging 12.16.20 - CT Abd pelvis  Pertinent IR History 12.18.20 -Paracentesis diagnostic and therapeutic 1.3 L of fluid removed. AFB negative 12.17.20 - Thoracentesis - 800 ml of fluid removed left  Pertinent Allergies  Temp 100.4. WBC is 6.3 All labs are within acceptable parameters. Patient is on subcutaneous prophylactic dose of lovenox.   After review of imaging no definitive site for peritoneal or omental biopsy. Recommend diagnostic paracentesis.  This was communicate to Team. After discussion with Team. Patient is declining procedures at this time. Team instructed to reconsult IR should this or any additional IR services be warranted  Electronically Signed: Marletta Lormohundro, Menachem Urbanek Corey, NP 01/29/2019, 8:26 AM

## 2019-01-29 NOTE — Progress Notes (Signed)
Patient complains of increased coughing episodes and mucus which makes her gag.  Specimen cup give to patient for an attempt collection.   Patient c/o increased pain in chest r/t coughing.   Tylenol given for increasing temp of 100.5 at this time.   Denies SOB or change in ABD pain- only discomfort & bloating Interpreter used

## 2019-01-29 NOTE — Progress Notes (Signed)
Patient seen at bedside for therapeutic thoracentesis and/or paracentesis with assitance by online Interpretor.[place] limited chest and US abdomen performed at bedside shows trace amount of pleural and peritoneal  fluid noted. Insufficient to perform a safe therapeutic thoracentesis or therapeutic paracentesis. Procedure (s) not performed.

## 2019-01-30 DIAGNOSIS — J189 Pneumonia, unspecified organism: Secondary | ICD-10-CM

## 2019-01-30 DIAGNOSIS — Z0389 Encounter for observation for other suspected diseases and conditions ruled out: Secondary | ICD-10-CM

## 2019-01-30 DIAGNOSIS — R0902 Hypoxemia: Secondary | ICD-10-CM

## 2019-01-30 DIAGNOSIS — N7011 Chronic salpingitis: Secondary | ICD-10-CM

## 2019-01-30 LAB — CBC
HCT: 30.4 % — ABNORMAL LOW (ref 36.0–46.0)
Hemoglobin: 10.1 g/dL — ABNORMAL LOW (ref 12.0–15.0)
MCH: 26.6 pg (ref 26.0–34.0)
MCHC: 33.2 g/dL (ref 30.0–36.0)
MCV: 80 fL (ref 80.0–100.0)
Platelets: 627 10*3/uL — ABNORMAL HIGH (ref 150–400)
RBC: 3.8 MIL/uL — ABNORMAL LOW (ref 3.87–5.11)
RDW: 14.6 % (ref 11.5–15.5)
WBC: 7.8 10*3/uL (ref 4.0–10.5)
nRBC: 0 % (ref 0.0–0.2)

## 2019-01-30 LAB — CULTURE, BODY FLUID W GRAM STAIN -BOTTLE: Culture: NO GROWTH

## 2019-01-30 LAB — BASIC METABOLIC PANEL
Anion gap: 11 (ref 5–15)
BUN: <5 mg/dL — ABNORMAL LOW (ref 6–20)
CO2: 21 mmol/L — ABNORMAL LOW (ref 22–32)
Calcium: 8.8 mg/dL — ABNORMAL LOW (ref 8.9–10.3)
Chloride: 108 mmol/L (ref 98–111)
Creatinine, Ser: 0.62 mg/dL (ref 0.44–1.00)
GFR calc Af Amer: >60 mL/min (ref >60)
GFR calc non Af Amer: >60 mL/min (ref >60)
Glucose, Bld: 87 mg/dL (ref 70–99)
Potassium: 3.9 mmol/L (ref 3.5–5.1)
Sodium: 140 mmol/L (ref 135–145)

## 2019-01-30 MED ORDER — VITAMIN B-6 100 MG PO TABS
50.0000 mg | ORAL_TABLET | Freq: Every day | ORAL | Status: DC
  Administered 2019-01-30 – 2019-02-01 (×3): 50 mg via ORAL
  Filled 2019-01-30 (×3): qty 1

## 2019-01-30 MED ORDER — RIFAMPIN 300 MG PO CAPS
10.0000 mg/kg/d | ORAL_CAPSULE | Freq: Every day | ORAL | Status: DC
  Filled 2019-01-30 (×2): qty 1

## 2019-01-30 MED ORDER — SODIUM CHLORIDE 0.9 % IV SOLN
100.0000 mg | Freq: Two times a day (BID) | INTRAVENOUS | Status: DC
  Administered 2019-01-30 – 2019-01-31 (×2): 100 mg via INTRAVENOUS
  Filled 2019-01-30 (×4): qty 100

## 2019-01-30 MED ORDER — ETHAMBUTOL HCL 400 MG PO TABS
800.0000 mg | ORAL_TABLET | Freq: Every day | ORAL | Status: DC
  Administered 2019-01-30 – 2019-02-01 (×3): 800 mg via ORAL
  Filled 2019-01-30 (×3): qty 2

## 2019-01-30 MED ORDER — PYRAZINAMIDE 500 MG PO TABS
1000.0000 mg | ORAL_TABLET | Freq: Every day | ORAL | Status: DC
  Administered 2019-01-30 – 2019-02-01 (×3): 1000 mg via ORAL
  Filled 2019-01-30 (×4): qty 2

## 2019-01-30 MED ORDER — RIFAMPIN 300 MG PO CAPS
600.0000 mg | ORAL_CAPSULE | Freq: Every day | ORAL | Status: DC
  Administered 2019-01-30 – 2019-02-01 (×3): 600 mg via ORAL
  Filled 2019-01-30 (×3): qty 2

## 2019-01-30 MED ORDER — ISONIAZID 300 MG PO TABS
300.0000 mg | ORAL_TABLET | Freq: Every day | ORAL | Status: DC
  Administered 2019-01-30 – 2019-02-01 (×3): 300 mg via ORAL
  Filled 2019-01-30 (×3): qty 1

## 2019-01-30 MED ORDER — SODIUM CHLORIDE 0.9 % IV SOLN
2.0000 g | Freq: Two times a day (BID) | INTRAVENOUS | Status: DC
  Administered 2019-01-30 – 2019-01-31 (×2): 2 g via INTRAVENOUS
  Filled 2019-01-30 (×3): qty 2

## 2019-01-30 NOTE — Progress Notes (Signed)
PROGRESS NOTE  Kahmari Herard GMW:[id number] DOB: 08-05-1990 DOA: 01/23/2019 PCP: Patient, No Pcp Per  HPI/Recap of past 9 hours: 28 year old Spanish speaking female with no medical problems presented to the emergency room with cough and pleuritic chest pain for about 2 to 3 weeks duration.  Recently treated for ESBL UTI.  She went to urgent care center where x-rays showed pleural effusion, was started on doxycycline.  She also felt some abdominal discomfort.  Patient did report subjective fever and chills at night. In the emergency room, hemodynamically stable.  CT scan of the chest with loculated left-sided pleural effusion, large volume abdominal ascites and omental caking.  Post thoracentesis and paracentesis by IR.  High suspicion for disseminated TB.  AFB cultures still in process.  Recurrent fevers.  Infectious disease following.  01/30/19: Seen and examined.  Interviewed using interpreter.  Reports pleuritic pain worse with cough and discomfort in her abdomen from distention.  Seen by infectious disease.  Consulted gynecology due to left pyosalpinx versus urogenital TB.   Assessment/Plan: Principal Problem:   Loculated pleural effusion Active Problems:   Recurrent UTI   Abdominal ascites   Normocytic anemia   Moderate protein-calorie malnutrition (HCC)   Fever   Biological false positive RPR test   Disseminated tuberculosis   Observation for suspected tuberculosis (TB)  Suspected disseminated tuberculosis Independently reviewed CT chest 12/16 which showed large likely loculated left sided pleural effusion, bilateral pulmonary nodules, bronchiectasis upper lobes with areas of calcification.  CT abdomen and pelvis also showed concern for disseminated tuberculosis. QuantiFERON gold plus nonreactive AFB smear negative for peritoneal fluid and for pleural fluid. Acid-fast culture in process.  Continue to follow cultures. Seen by infectious disease, following.  Started  on treatment 12/22 for presumed disseminated TB Cytology non Pap for thoracentesis no malignant cells found Cytology non Pap for paracentesis no malignant cells found  Left pyosalpinx versus urogenital TB Findings seen on CT abdomen pelvis and confirmed with complete transvaginal pelvic ultrasound. Gynecology consulted and following. Management per gynecology  Recurrent fevers, suspect infection related with possible disseminated TB Febrile with T-max 102.8 overnight. Tachycardia has improved. Blood CX x2 obtained on 12/19 negative to date.  Continue to follow cultures. Continue symptomatic treatment.  False positive syphilis test Non treponemal test RPR reactive Treponemal test non reactive, titer unremarkable. HIV nonreactive  Resolved hypotension Blood pressure stable Discontinued IV fluids normal saline at 75 cc/h on 12/22  Normocytic anemia Hemoglobin stable and trending up 10.1 No sign of overt bleeding  Loculated left pleural effusion status post left thoracentesis 12/17 800 cc fluid removed post left thoracentesis 12/17 Gram stain no organisms seen Follow AFB culture as stated above.  Abdominal ascites post paracentesis 12/18 1.3 L of peritoneal fluid removed post paracentesis 12/18 Acute hepatitis panel negative Follow AFB culture as stated above.  Severe protein calorie malnutrition with muscular wasting Albumin 2.7 BMI 18 Continue oral supplement  DVT prophylaxis: SCDs and subcu Lovenox daily. Code Status: Full code Family Communication: None  Disposition Plan: Discharge home when infectious disease and gynecology sign off. Consultants:   Infectious disease  Gynecology  Procedures:   Thoracentesis, 12/17  Paracentesis, 12/18  Complete transvaginal pelvic ultrasound 12/21  Antimicrobials:   None     Objective: Vitals:   01/30/19 0600 01/30/19 0603 01/30/19 0945 01/30/19 1658  BP:   110/84 107/75  Pulse:   88 100  Resp: 20 20 (!)  23 (!) 21  Temp:  98.1 F (36.7 C) 99  F (37.2 C) 98.3 F (36.8 C)  TempSrc:  Oral Oral Oral  SpO2:  95% 96% 91%  Weight:      Height:        Intake/Output Summary (Last 24 hours) at 01/30/2019 1948 Last data filed at 01/30/2019 1516 Gross per 24 hour  Intake 2368.75 ml  Output --  Net 2368.75 ml   Filed Weights   01/24/19 2241  Weight: 48 kg    Exam:  . General: 28 y.o. year-old female Frail-appearing no acute distress.  Alert and oriented x3.  Cardiovascular: Regular rate and rhythm no rubs or gallops. Marland Kitchen Respiratory: No respiratory no wheezing noted. . Abdomen: Distended abdomen bowel sounds present. Musculoskeletal: No lower extremity edema.   Psychiatry: Mood is appropriate for condition and setting. Data Reviewed: CBC: Recent Labs  Lab 01/24/19 0450 01/25/19 1307 01/26/19 0714 01/28/19 0325 01/28/19 0729 01/29/19 0554 01/30/19 0309  WBC 6.5 7.6 6.3 6.3  --  6.7 7.8  NEUTROABS 4.5 6.1 4.2  --   --  4.9  --   HGB 10.7* 11.1* 10.8* 8.1* 9.2* 9.6* 10.1*  HCT 32.4* 34.1* 32.7* 24.6* 27.5* 29.4* 30.4*  MCV 82.7 82.8 81.5 82.0  --  82.6 80.0  PLT 645* 694* 644* 530*  --  556* 627*   Basic Metabolic Panel: Recent Labs  Lab 01/25/19 1307 01/26/19 0714 01/28/19 0325 01/29/19 0721 01/30/19 0309  NA 135 136 139 138 140  K 4.2 4.3 3.5 3.4* 3.9  CL 105 106 108 112* 108  CO2 21* 21* 19* 20* 21*  GLUCOSE 93 90 90 95 87  BUN <5* <5* <5* 5* <5*  CREATININE 0.50 0.61 0.55 0.55 0.62  CALCIUM 8.4* 8.3* 8.7* 8.4* 8.8*   GFR: Estimated Creatinine Clearance: 80 mL/min (by C-G formula based on SCr of 0.62 mg/dL). Liver Function Tests: Recent Labs  Lab 01/24/19 0450 01/25/19 1307 01/28/19 0325 01/29/19 0721  AST 18 18 21 30   ALT 11 11 11 17   ALKPHOS 39 41 33* 33*  BILITOT 0.6 0.3 0.4 0.5  PROT 7.7 7.0 7.1 6.1*  ALBUMIN 3.0* 2.7* 4.6 3.4*   No results for input(s): LIPASE, AMYLASE in the last 168 hours. No results for input(s): AMMONIA in the last 168  hours. Coagulation Profile: Recent Labs  Lab 01/29/19 0721  INR 1.3*   Cardiac Enzymes: No results for input(s): CKTOTAL, CKMB, CKMBINDEX, TROPONINI in the last 168 hours. BNP (last 3 results) No results for input(s): PROBNP in the last 8760 hours. HbA1C: No results for input(s): HGBA1C in the last 72 hours. CBG: No results for input(s): GLUCAP in the last 168 hours. Lipid Profile: No results for input(s): CHOL, HDL, LDLCALC, TRIG, CHOLHDL, LDLDIRECT in the last 72 hours. Thyroid Function Tests: No results for input(s): TSH, T4TOTAL, FREET4, T3FREE, THYROIDAB in the last 72 hours. Anemia Panel: No results for input(s): VITAMINB12, FOLATE, FERRITIN, TIBC, IRON, RETICCTPCT in the last 72 hours. Urine analysis:    Component Value Date/Time   COLORURINE YELLOW 01/24/2019 0038   APPEARANCEUR HAZY (A) 01/24/2019 0038   LABSPEC 1.020 01/24/2019 0038   PHURINE 5.0 01/24/2019 0038   GLUCOSEU NEGATIVE 01/24/2019 0038   HGBUR SMALL (A) 01/24/2019 0038   BILIRUBINUR NEGATIVE 01/24/2019 0038   KETONESUR 5 (A) 01/24/2019 0038   PROTEINUR NEGATIVE 01/24/2019 0038   NITRITE NEGATIVE 01/24/2019 0038   LEUKOCYTESUR NEGATIVE 01/24/2019 0038   Sepsis Labs: @LABRCNTIP (procalcitonin:4,lacticidven:4)  ) Recent Results (from the past 240 hour(s))  Blood culture (routine x 2)  Status: None   Collection Time: 01/24/19  4:38 AM   Specimen: BLOOD RIGHT HAND  Result Value Ref Range Status   Specimen Description BLOOD RIGHT HAND  Final   Special Requests   Final    BOTTLES DRAWN AEROBIC AND ANAEROBIC Blood Culture results may not be optimal due to an inadequate volume of blood received in culture bottles   Culture   Final    NO GROWTH 5 DAYS Performed at [facility], [address], [place], [place] [address]    Report Status 01/29/2019 FINAL  Final  Blood culture (routine x 2)     Status: None   Collection Time: 01/24/19  4:38 AM   Specimen: BLOOD  Result Value Ref Range Status    Specimen Description BLOOD LEFT ANTECUBITAL  Final   Special Requests   Final    BOTTLES DRAWN AEROBIC AND ANAEROBIC Blood Culture adequate volume   Culture   Final    NO GROWTH 5 DAYS Performed at [facility], [address], [place], [place] [address]    Report Status 01/29/2019 FINAL  Final  SARS CORONAVIRUS 2 (TAT 6-24 HRS) Nasopharyngeal Nasopharyngeal Swab     Status: None   Collection Time: 01/24/19  8:53 AM   Specimen: Nasopharyngeal Swab  Result Value Ref Range Status   SARS Coronavirus 2 NEGATIVE NEGATIVE Final    Comment: (NOTE) SARS-CoV-2 target nucleic acids are NOT DETECTED. The SARS-CoV-2 RNA is generally detectable in upper and lower respiratory specimens during the acute phase of infection. Negative results do not preclude SARS-CoV-2 infection, do not rule out co-infections with other pathogens, and should not be used as the sole basis for treatment or other patient management decisions. Negative results must be combined with clinical observations, patient history, and epidemiological information. The expected result is Negative. Fact Sheet for Patients: HairSlick.no Fact Sheet for Healthcare Providers: quierodirigir.com This test is not yet approved or cleared by the [place] FDA and  has been authorized for detection and/or diagnosis of SARS-CoV-2 by FDA under an Emergency Use Authorization (EUA). This EUA will remain  in effect (meaning this test can be used) for the duration of the COVID-19 declaration under Section 56 4(b)(1) of the Act, 21 U.S.C. section 360bbb-3(b)(1), unless the authorization is terminated or revoked sooner. Performed at [facility], [address], [place], [place] [address]   Gram stain     Status: None   Collection Time: 01/25/19  1:53 PM   Specimen: Lung, Left; Pleural Fluid  Result Value Ref Range Status   Specimen Description PLEURAL LEFT  Final    Special Requests NONE  Final   Gram Stain   Final    ABUNDANT WBC PRESENT, PREDOMINANTLY MONONUCLEAR NO ORGANISMS SEEN Performed at [facility], [address], [place], [place] [address]    Report Status 01/25/2019 FINAL  Final  Acid Fast Smear (AFB)     Status: None   Collection Time: 01/25/19  1:53 PM   Specimen: Lung, Left; Pleural Fluid  Result Value Ref Range Status   AFB Specimen Processing Concentration  Final   Acid Fast Smear Negative  Final    Comment: (NOTE) Performed At: [facility] [address] [place], [place] [id number] Jolene Schimke MD MV:[id number]    Source (AFB) PLEURAL  Final    Comment: LEFT Performed at [facility], [address] [place], [place] [address]   Culture, body fluid-bottle  Status: None   Collection Time: 01/25/19  1:53 PM   Specimen: Pleura  Result Value Ref Range Status   Specimen Description PLEURAL LEFT  Final   Special Requests NONE  Final   Culture   Final    NO GROWTH 5 DAYS Performed at [facility], [address], [place], [place] [address]    Report Status 01/30/2019 FINAL  Final  Gram stain     Status: None   Collection Time: 01/26/19  3:41 PM   Specimen: Abdomen; Peritoneal Fluid  Result Value Ref Range Status   Specimen Description FLUID PERITONEAL  Final   Special Requests NONE  Final   Gram Stain   Final    NO WBC SEEN NO ORGANISMS SEEN Performed at [facility], [address] [place], [place] [address]    Report Status 01/26/2019 FINAL  Final  Fungus Culture With Stain     Status: None (Preliminary result)   Collection Time: 01/26/19  3:41 PM   Specimen: Abdomen; Peritoneal Fluid  Result Value Ref Range Status   Fungus Stain Final report  Final    Comment: (NOTE) Performed At: [facility] [address] [place], [place] [id number] Jolene Schimke MD WG:[id number]    Fungus (Mycology) Culture PENDING  Incomplete   Fungal Source FLUID  Final    Comment:  PERITONEAL Performed at [facility] Lab, [address] [place], [place] [address]   Acid Fast Smear (AFB)     Status: None   Collection Time: 01/26/19  3:41 PM   Specimen: Abdomen; Peritoneal Fluid  Result Value Ref Range Status   AFB Specimen Processing Concentration  Final   Acid Fast Smear Negative  Final    Comment: (NOTE) Performed At: [facility] [address] [place], [place] [id number] Jolene Schimke MD KG:[id number]    Source (AFB) FLUID  Final    Comment: PERITONEAL Performed at [facility], [address], [place], [place] [address]   Culture, body fluid-bottle     Status: None (Preliminary result)   Collection Time: 01/26/19  3:41 PM   Specimen: Fluid  Result Value Ref Range Status   Specimen Description FLUID PERITONEAL  Final   Special Requests BOTTLES DRAWN AEROBIC AND ANAEROBIC 10CC  Final   Culture   Final    NO GROWTH 4 DAYS Performed at [facility], [address], [place], [place] [address]    Report Status PENDING  Incomplete  Fungus Culture Result     Status: None   Collection Time: 01/26/19  3:41 PM  Result Value Ref Range Status   Result 1 Comment  Final    Comment: (NOTE) KOH/Calcofluor preparation:  no fungus observed. Performed At: [facility] [address] [place], [place] [id number] Jolene Schimke MD PP:[id number]   Culture, blood (routine x 2)     Status: None (Preliminary result)   Collection Time: 01/27/19  4:45 PM   Specimen: BLOOD  Result Value Ref Range Status   Specimen Description BLOOD LEFT ANTECUBITAL  Final   Special Requests   Final    BOTTLES DRAWN AEROBIC AND ANAEROBIC Blood Culture adequate volume   Culture   Final    NO GROWTH 3 DAYS Performed at [facility], [address] [place], [place] [address]    Report Status PENDING  Incomplete  Culture, blood (routine x 2)     Status: None (Preliminary result)   Collection Time: 01/27/19  4:53  PM   Specimen: BLOOD LEFT HAND   Result Value Ref Range Status   Specimen Description BLOOD LEFT HAND  Final   Special Requests   Final    BOTTLES DRAWN AEROBIC AND ANAEROBIC Blood Culture adequate volume   Culture   Final    NO GROWTH 3 DAYS Performed at [facility], [address] [place], [place] [id number]    Report Status PENDING  Incomplete      Studies: US PELVIC COMPLETE WITH TRANSVAGINAL  Result Date: 01/29/2019 CLINICAL DATA:  Serpiginous cystic appearing structure in the left pelvis on a recent abdomen and pelvis CT, concerning for a possible hydrosalpinx or pyosalpinx. The patient also had amorphous soft tissue in the pelvis with indistinct ovaries on the CT. She also had omental caking and above water density free peritoneal fluid on the CT, suspicious for carcinomatosis or TB peritonitis. EXAM: TRANSABDOMINAL AND TRANSVAGINAL ULTRASOUND OF PELVIS TECHNIQUE: Both transabdominal and transvaginal ultrasound examinations of the pelvis were performed. Transabdominal technique was performed for global imaging of the pelvis including uterus, ovaries, adnexal regions, and pelvic cul-de-sac. It was necessary to proceed with endovaginal exam following the transabdominal exam to visualize the uterus and ovaries in better detail. COMPARISON:  Abdomen and pelvis CT dated 01/24/2019. FINDINGS: Uterus Measurements: 5.3 x 4.0 x 3.1 cm = volume: 34 mL. No fibroids or other mass visualized. Endometrium Thickness: 10.0 mm.  No focal abnormality visualized. Right ovary Measurements: 4.2 x 2.6 x 2.4 cm = volume: 13 mL. Normal appearance/no adnexal mass. Left ovary Measurements: 4.2 x 3.1 x 3.0 cm = volume: 20 mL. Normal appearance/no adnexal mass. Other findings Moderately large amount of free peritoneal fluid containing diffuse low-level internal echoes. There is also a multiloculated, near anechoic fluid collection in the left adnexal region extending superiorly into the free peritoneal fluid. This corresponds to the abnormality  on the recent CT. IMPRESSION: 1. Multiloculated, near anechoic fluid collection in the left adnexal region, extending superiorly into a moderately large amount of complicated free peritoneal fluid. Differential considerations include a large hydrosalpinx, multiloculated fluid collection associated with peritonitis or multiloculated fluid collection associated with carcinomatosis. 2. Moderately large amount of complicated free peritoneal fluid. This could represent infected, hemorrhagic or proteinaceous fluid. 3. Normal appearing uterus and ovaries. Electronically Signed   By: Beckie SaltsSteven  Reid M.D.   On: 01/29/2019 20:48    Scheduled Meds: . chlorpheniramine-HYDROcodone  5 mL Oral Q12H  . dextromethorphan-guaiFENesin  2 tablet Oral BID  . enoxaparin (LOVENOX) injection  40 mg Subcutaneous Daily  . ethambutol  800 mg Oral Daily  . isoniazid  300 mg Oral Daily  . polyethylene glycol  17 g Oral Daily  . Ensure Max Protein  11 oz Oral Daily  . pyrazinamide  1,000 mg Oral Daily  . vitamin B-6  50 mg Oral Daily  . rifampin  600 mg Oral Daily  . senna-docusate  2 tablet Oral BID    Continuous Infusions: . cefoTEtan (CEFOTAN) IV    . doxycycline (VIBRAMYCIN) IV 100 mg (01/30/19 1856)     LOS: 6 days     Darlin Droparole N Favio Moder, MD [facility] Pager [phone]  If 7PM-7AM, please contact night-coverage [email or web address] Password [email or web address] 01/30/2019, 7:48 PM

## 2019-01-30 NOTE — Progress Notes (Addendum)
Subjective: No new complaints.   Antibiotics:  Anti-infectives (From admission, onward)   Start     Dose/Rate Route Frequency Ordered Stop   01/30/19 1100  rifampin (RIFADIN) capsule 600 mg     600 mg Oral Daily 01/30/19 1020     01/30/19 1000  isoniazid (NYDRAZID) tablet 300 mg     300 mg Oral Daily 01/30/19 0933     01/30/19 1000  rifampin (RIFADIN) capsule 450 mg  Status:  Discontinued     10 mg/kg/day  48 kg Oral Daily 01/30/19 0933 01/30/19 1020   01/30/19 1000  pyrazinamide tablet 1,000 mg     1,000 mg Oral Daily 01/30/19 0933     01/30/19 1000  ethambutol (MYAMBUTOL) tablet 800 mg     800 mg Oral Daily 01/30/19 0933        Medications: Scheduled Meds: . chlorpheniramine-HYDROcodone  5 mL Oral Q12H  . dextromethorphan-guaiFENesin  2 tablet Oral BID  . enoxaparin (LOVENOX) injection  40 mg Subcutaneous Daily  . ethambutol  800 mg Oral Daily  . isoniazid  300 mg Oral Daily  . polyethylene glycol  17 g Oral Daily  . Ensure Max Protein  11 oz Oral Daily  . pyrazinamide  1,000 mg Oral Daily  . vitamin B-6  50 mg Oral Daily  . rifampin  600 mg Oral Daily  . senna-docusate  2 tablet Oral BID   Continuous Infusions: . sodium chloride 75 mL/hr at 01/30/19 0653   PRN Meds:.acetaminophen **OR** acetaminophen, HYDROcodone-acetaminophen, lidocaine (PF), lidocaine, ondansetron **OR** ondansetron (ZOFRAN) IV    Objective: Weight change:   Intake/Output Summary (Last 24 hours) at 01/30/2019 1220 Last data filed at 01/30/2019 0653 Gross per 24 hour  Intake 4783.31 ml  Output --  Net 4783.31 ml   Blood pressure 110/84, pulse 88, temperature 99 F (37.2 C), temperature source Oral, resp. rate (!) 23, height  (1.6 m), weight 48 kg, last menstrual period 01/09/2019, SpO2 96 %. Temp:  [98.1 F (36.7 C)-102.8 F (39.3 C)] 99 F (37.2 C) (12/22 0945) Pulse Rate:  [88-89] 88 (12/22 0945) Resp:  [19-24] 23 (12/22 0945) BP: (110-124)/(67-84) 110/84 (12/22  0945) SpO2:  [95 %-96 %] 96 % (12/22 0945)  Physical Exam: General: Alert and awake, oriented x3, not in any acute distress. HEENT: anicteric sclera, EOMI CVS regular rate, normal  Chest: , no wheezing, no respiratory distress Abdomen: soft non-distended,  Extremities: no edema or deformity noted bilaterally Skin: no rashes Neuro: nonfocal  CBC:    BMET Recent Labs    01/29/19 0721 01/30/19 0309  NA 138 140  K 3.4* 3.9  CL 112* 108  CO2 20* 21*  GLUCOSE 95 87  BUN 5* <5*  CREATININE 0.55 0.62  CALCIUM 8.4* 8.8*     Liver Panel  Recent Labs    01/28/19 0325 01/29/19 0721  PROT 7.1 6.1*  ALBUMIN 4.6 3.4*  AST 21 30  ALT 11 17  ALKPHOS 33* 33*  BILITOT 0.4 0.5       Sedimentation Rate No results for input(s): ESRSEDRATE in the last 72 hours. C-Reactive Protein No results for input(s): CRP in the last 72 hours.  Micro Results: Recent Results (from the past 720 hour(s))  Blood culture (routine x 2)     Status: None   Collection Time: 01/24/19  4:38 AM   Specimen: BLOOD RIGHT HAND  Result Value Ref Range Status   Specimen Description BLOOD RIGHT HAND  Final   Special Requests   Final    BOTTLES DRAWN AEROBIC AND ANAEROBIC Blood Culture results may not be optimal due to an inadequate volume of blood received in culture bottles   Culture   Final    NO GROWTH 5 DAYS Performed at [facility], [address] [place], [place] [address]    Report Status 01/29/2019 FINAL  Final  Blood culture (routine x 2)     Status: None   Collection Time: 01/24/19  4:38 AM   Specimen: BLOOD  Result Value Ref Range Status   Specimen Description BLOOD LEFT ANTECUBITAL  Final   Special Requests   Final    BOTTLES DRAWN AEROBIC AND ANAEROBIC Blood Culture adequate volume   Culture   Final    NO GROWTH 5 DAYS Performed at [facility], [address], [place], [place] [address]    Report Status 01/29/2019 FINAL  Final  SARS CORONAVIRUS 2 (TAT 6-24 HRS)  Nasopharyngeal Nasopharyngeal Swab     Status: None   Collection Time: 01/24/19  8:53 AM   Specimen: Nasopharyngeal Swab  Result Value Ref Range Status   SARS Coronavirus 2 NEGATIVE NEGATIVE Final    Comment: (NOTE) SARS-CoV-2 target nucleic acids are NOT DETECTED. The SARS-CoV-2 RNA is generally detectable in upper and lower respiratory specimens during the acute phase of infection. Negative results do not preclude SARS-CoV-2 infection, do not rule out co-infections with other pathogens, and should not be used as the sole basis for treatment or other patient management decisions. Negative results must be combined with clinical observations, patient history, and epidemiological information. The expected result is Negative. Fact Sheet for Patients: HairSlick.no Fact Sheet for Healthcare Providers: quierodirigir.com This test is not yet approved or cleared by the [place] FDA and  has been authorized for detection and/or diagnosis of SARS-CoV-2 by FDA under an Emergency Use Authorization (EUA). This EUA will remain  in effect (meaning this test can be used) for the duration of the COVID-19 declaration under Section 56 4(b)(1) of the Act, 21 U.S.C. section 360bbb-3(b)(1), unless the authorization is terminated or revoked sooner. Performed at [facility], [address] [place], [place] [address]   Gram stain     Status: None   Collection Time: 01/25/19  1:53 PM   Specimen: Lung, Left; Pleural Fluid  Result Value Ref Range Status   Specimen Description PLEURAL LEFT  Final   Special Requests NONE  Final   Gram Stain   Final    ABUNDANT WBC PRESENT, PREDOMINANTLY MONONUCLEAR NO ORGANISMS SEEN Performed at [facility], [address], [place], [place] [address]    Report Status 01/25/2019 FINAL  Final  Acid Fast Smear (AFB)     Status: None   Collection Time: 01/25/19  1:53 PM   Specimen: Lung, Left; Pleural  Fluid  Result Value Ref Range Status   AFB Specimen Processing Concentration  Final   Acid Fast Smear Negative  Final    Comment: (NOTE) Performed At: [facility] [address] [place], [place] [id number] Jolene Schimke MD [id number]    Source (AFB) PLEURAL  Final    Comment: LEFT Performed at [facility], [address], [place], [place] [address]   Culture, body fluid-bottle     Status: None   Collection Time: 01/25/19  1:53 PM   Specimen: Pleura  Result Value Ref Range Status   Specimen Description PLEURAL LEFT  Final  Special Requests NONE  Final   Culture   Final    NO GROWTH 5 DAYS Performed at [facility], [address], [place], [place] [address]    Report Status 01/30/2019 FINAL  Final  Gram stain     Status: None   Collection Time: 01/26/19  3:41 PM   Specimen: Abdomen; Peritoneal Fluid  Result Value Ref Range Status   Specimen Description FLUID PERITONEAL  Final   Special Requests NONE  Final   Gram Stain   Final    NO WBC SEEN NO ORGANISMS SEEN Performed at [facility], [address], [place], [place] [address]    Report Status 01/26/2019 FINAL  Final  Fungus Culture With Stain     Status: None (Preliminary result)   Collection Time: 01/26/19  3:41 PM   Specimen: Abdomen; Peritoneal Fluid  Result Value Ref Range Status   Fungus Stain Final report  Final    Comment: (NOTE) Performed At: [facility] [address] [place], [place] [id number] Jolene Schimke MD [id number]    Fungus (Mycology) Culture PENDING  Incomplete   Fungal Source FLUID  Final    Comment: PERITONEAL Performed at [facility], [address], [place], [place] [address]   Acid Fast Smear (AFB)     Status: None   Collection Time: 01/26/19  3:41 PM   Specimen: Abdomen; Peritoneal Fluid  Result Value Ref Range Status   AFB Specimen Processing Concentration  Final   Acid Fast Smear Negative  Final    Comment: (NOTE) Performed At:  [facility] [address] [place], [place] [id number] Jolene Schimke MD [id number]    Source (AFB) FLUID  Final    Comment: PERITONEAL Performed at [facility], [address] [place], [place] [address]   Culture, body fluid-bottle     Status: None (Preliminary result)   Collection Time: 01/26/19  3:41 PM   Specimen: Fluid  Result Value Ref Range Status   Specimen Description FLUID PERITONEAL  Final   Special Requests BOTTLES DRAWN AEROBIC AND ANAEROBIC 10CC  Final   Culture   Final    NO GROWTH 4 DAYS Performed at [facility], [address], [place], [place] [address]    Report Status PENDING  Incomplete  Fungus Culture Result     Status: None   Collection Time: 01/26/19  3:41 PM  Result Value Ref Range Status   Result 1 Comment  Final    Comment: (NOTE) KOH/Calcofluor preparation:  no fungus observed. Performed At: [facility] [address] [place], [place] [id number] Jolene Schimke MD UX:[id number]   Culture, blood (routine x 2)     Status: None (Preliminary result)   Collection Time: 01/27/19  4:45 PM   Specimen: BLOOD  Result Value Ref Range Status   Specimen Description BLOOD LEFT ANTECUBITAL  Final   Special Requests   Final    BOTTLES DRAWN AEROBIC AND ANAEROBIC Blood Culture adequate volume   Culture   Final    NO GROWTH 3 DAYS Performed at [facility], [address] [place], [place] [address]    Report Status PENDING  Incomplete  Culture, blood (routine x 2)     Status: None (Preliminary result)   Collection Time: 01/27/19  4:53 PM   Specimen: BLOOD LEFT HAND  Result Value Ref Range Status   Specimen Description BLOOD LEFT HAND  Final   Special Requests   Final  BOTTLES DRAWN AEROBIC AND ANAEROBIC Blood Culture adequate volume   Culture   Final    NO GROWTH 3 DAYS Performed at [facility], [address] [place], [place] [id number]    Report Status PENDING  Incomplete    Studies/Results: DG  CHEST PORT 1 VIEW  Result Date: 01/29/2019 CLINICAL DATA:  Hypoxia, chest pain, thoracentesis 01/25/2019 EXAM: PORTABLE CHEST 1 VIEW COMPARISON:  Portable exam 1729 hours compared to 01/25/2019 and correlated with CT chest of 01/24/2019 FINDINGS: Normal heart size mediastinal contours. Small to moderate LEFT and tiny RIGHT pleural effusions. Bibasilar atelectasis greater on LEFT. Patchy infiltrates in the upper lobes again identified. Nodular foci in the lungs on the prior CT exam are not well demonstrated radiographically. No pneumothorax or acute osseous findings. IMPRESSION: Increased LEFT pleural effusion and new tiny RIGHT pleural effusion since 01/25/2019 with associated bibasilar atelectasis. Pulmonary nodules identified on prior CT much less well visualized radiographically. Electronically Signed   By: Ulyses SouthwardMark  Boles M.D.   On: 01/29/2019 17:50   IR ABDOMEN US LIMITED  Result Date: 01/30/2019 CLINICAL DATA:  28 year old female with possible extrapulmonary tuberculosis and a recent history of ascites. Evaluate for recurrent ascites. EXAM: LIMITED ABDOMEN ULTRASOUND FOR ASCITES TECHNIQUE: Limited ultrasound survey for ascites was performed in all four abdominal quadrants. COMPARISON:  Prior paracentesis 01/26/2019 FINDINGS: Ultrasound was used to interrogate the abdomen. There is trace recurrent ascites which is insufficient for paracentesis. IMPRESSION: Trace recurrent ascites, insufficient for paracentesis. Electronically Signed   By: Malachy MoanHeath  McCullough M.D.   On: 01/30/2019 10:20   IR US CHEST  Result Date: 01/30/2019 CLINICAL DATA:  28 year old female with possible recurrent left pleural effusion and ascites. EXAM: CHEST ULTRASOUND COMPARISON:  Left thoracentesis 01/24/2019 FINDINGS: Ultrasound was used to interrogate the left chest. There is no significant recurrent pleural effusion. IMPRESSION: Negative for significant recurrent left pleural effusion. Thoracentesis was not performed.  Electronically Signed   By: Malachy MoanHeath  McCullough M.D.   On: 01/30/2019 08:14   US PELVIC COMPLETE WITH TRANSVAGINAL  Result Date: 01/29/2019 CLINICAL DATA:  Serpiginous cystic appearing structure in the left pelvis on a recent abdomen and pelvis CT, concerning for a possible hydrosalpinx or pyosalpinx. The patient also had amorphous soft tissue in the pelvis with indistinct ovaries on the CT. She also had omental caking and above water density free peritoneal fluid on the CT, suspicious for carcinomatosis or TB peritonitis. EXAM: TRANSABDOMINAL AND TRANSVAGINAL ULTRASOUND OF PELVIS TECHNIQUE: Both transabdominal and transvaginal ultrasound examinations of the pelvis were performed. Transabdominal technique was performed for global imaging of the pelvis including uterus, ovaries, adnexal regions, and pelvic cul-de-sac. It was necessary to proceed with endovaginal exam following the transabdominal exam to visualize the uterus and ovaries in better detail. COMPARISON:  Abdomen and pelvis CT dated 01/24/2019. FINDINGS: Uterus Measurements: 5.3 x 4.0 x 3.1 cm = volume: 34 mL. No fibroids or other mass visualized. Endometrium Thickness: 10.0 mm.  No focal abnormality visualized. Right ovary Measurements: 4.2 x 2.6 x 2.4 cm = volume: 13 mL. Normal appearance/no adnexal mass. Left ovary Measurements: 4.2 x 3.1 x 3.0 cm = volume: 20 mL. Normal appearance/no adnexal mass. Other findings Moderately large amount of free peritoneal fluid containing diffuse low-level internal echoes. There is also a multiloculated, near anechoic fluid collection in the left adnexal region extending superiorly into the free peritoneal fluid. This corresponds to the abnormality on the recent CT. IMPRESSION: 1. Multiloculated, near anechoic fluid collection in the left adnexal region, extending  superiorly into a moderately large amount of complicated free peritoneal fluid. Differential considerations include a large hydrosalpinx, multiloculated  fluid collection associated with peritonitis or multiloculated fluid collection associated with carcinomatosis. 2. Moderately large amount of complicated free peritoneal fluid. This could represent infected, hemorrhagic or proteinaceous fluid. 3. Normal appearing uterus and ovaries. Electronically Signed   By: Claudie Revering M.D.   On: 01/29/2019 20:48      Assessment/Plan:  INTERVAL HISTORY: Patient cannot have ultrasound-guided paracentesis or thoracentesis   Principal Problem:   Loculated pleural effusion Active Problems:   Recurrent UTI   Abdominal ascites   Normocytic anemia   Moderate protein-calorie malnutrition (HCC)   Fever   Biological false positive RPR test   Disseminated tuberculosis   Observation for suspected tuberculosis (TB)    Nancy Hurst is a 28 y.o. female with high fevers lymphocytic exudative pleural effusion and peritonitis concerning for extrapulmonary tuberculosis.  Apparently IR could not safely perform paracentesis or thoracentesis.  Given that this is the case I think that it may be more reasonable to offer her a therapeutic trial of empiric treatment for extrapulmonary tuberculosis.  I am more to start her on 4 drugs for extrapulmonary tuberculosis  I have gotten in touch with Dot Been with the health department  [place] could visit her tomorrow if she is discharged today or early tomorrow morning   Nancy Hurst has an appointment on February 21, 2019 at [place] [facility] is located in the [facility] at  [address] in [place].  [address], which is located to the left of the elevators.  Phone: [phone]  Fax: [phone]  [email or web address]  She should arrive 15 minutes prior to her appointment  I spent greater than 35 minutes with the patient including greater than 50% of time in face to face  counsel of the patient using telephonic Spanish translation and in coordination of her care with the health department.   ADDENDUM: Pt is going to be in by OB/GYN concerning the serpiginous soft tissue mass in her pelvis.  Certainly if she undergoes surgery from OB/GYN and/or general surgery biopsy of this mass with specimen being sent for AFB stain and cultures could also be helpful in diagnosing what I think is likely extrapulmonary tuberculosis.    LOS: 6 days   Alcide Evener 01/30/2019, 12:20 PM

## 2019-01-30 NOTE — Consult Note (Signed)
Reason for Consult:  Pyosalpinx vs urogenital TB Referring Physician: Irene Pap, MD  Nancy Hurst is an 28 y.o. female admitted with concerns of disseminated TB. Work up noted possible pyosalpinx. GYN was consulted for management recommendations.  Pt currently denies any lower abd or pelvic pain.  Cycle monthly. LMP First of December.  Sexual active without problems or contraction.  Last pap unknown. Denies STD  Nulligravid   Menstrual History: Menarche age: 29 Patient's last menstrual period was 01/09/2019.    History reviewed. No pertinent past medical history.  Past Surgical History:  Procedure Laterality Date  . BREAST SURGERY     left breast  . IR PARACENTESIS  01/26/2019  . IR THORACENTESIS ASP PLEURAL SPACE W/IMG GUIDE  01/25/2019  . LAPAROSCOPIC APPENDECTOMY N/A 11/03/2017   Procedure: APPENDECTOMY LAPAROSCOPIC;  Surgeon: Stark Klein, MD;  Location: [facility];  Service: General;  Laterality: N/A;    Family History  Family history unknown: Yes    Social History:  reports that she has never smoked. She has never used smokeless tobacco. She reports that she does not drink alcohol or use drugs.  Allergies: No Known Allergies  Medications: I have reviewed the patient's current medications.  Review of Systems: As per HPI   Blood pressure 107/75, pulse 100, temperature 98.3 F (36.8 C), temperature source Oral, resp. rate (!) 21, height 5\' 3"  (1.6 m), weight 48 kg, last menstrual period 01/09/2019, SpO2 91 %. Physical Exam Lungs clear Heart RRR Abd soft + BS non tender GU deffered Ext non tender  Results for orders placed or performed during the hospital encounter of 01/23/19 (from the past 48 hour(s))  CBC with Differential/Platelet     Status: Abnormal   Collection Time: 01/29/19  5:54 AM  Result Value Ref Range   WBC 6.7 4.0 - 10.5 K/uL   RBC 3.56 (L) 3.87 - 5.11 MIL/uL   Hemoglobin 9.6 (L) 12.0 - 15.0 g/dL   HCT 29.4 (L) 36.0 - 46.0 %    MCV 82.6 80.0 - 100.0 fL   MCH 27.0 26.0 - 34.0 pg   MCHC 32.7 30.0 - 36.0 g/dL   RDW 15.5 11.5 - 15.5 %   Platelets 556 (H) 150 - 400 K/uL   nRBC 0.0 0.0 - 0.2 %   Neutrophils Relative % 73 %   Neutro Abs 4.9 1.7 - 7.7 K/uL   Lymphocytes Relative 15 %   Lymphs Abs 1.0 0.7 - 4.0 K/uL   Monocytes Relative 10 %   Monocytes Absolute 0.7 0.1 - 1.0 K/uL   Eosinophils Relative 1 %   Eosinophils Absolute 0.1 0.0 - 0.5 K/uL   Basophils Relative 1 %   Basophils Absolute 0.1 0.0 - 0.1 K/uL   Immature Granulocytes 0 %   Abs Immature Granulocytes 0.03 0.00 - 0.07 K/uL    Comment: Performed at [facility], [address], [place], [place] [address]  Comprehensive metabolic panel     Status: Abnormal   Collection Time: 01/29/19  7:21 AM  Result Value Ref Range   Sodium 138 135 - 145 mmol/L   Potassium 3.4 (L) 3.5 - 5.1 mmol/L   Chloride 112 (H) 98 - 111 mmol/L   CO2 20 (L) 22 - 32 mmol/L   Glucose, Bld 95 70 - 99 mg/dL   BUN 5 (L) 6 - 20 mg/dL   Creatinine, Ser 0.55 0.44 - 1.00 mg/dL   Calcium 8.4 (L) 8.9 - 10.3 mg/dL   Total Protein  6.1 (L) 6.5 - 8.1 g/dL   Albumin 3.4 (L) 3.5 - 5.0 g/dL   AST 30 15 - 41 U/L   ALT 17 0 - 44 U/L   Alkaline Phosphatase 33 (L) 38 - 126 U/L   Total Bilirubin 0.5 0.3 - 1.2 mg/dL   GFR calc non Af Amer >60 >60 mL/min   GFR calc Af Amer >60 >60 mL/min   Anion gap 6 5 - 15    Comment: Performed at [facility], [address] [place], [place] [address]  Protime-INR     Status: Abnormal   Collection Time: 01/29/19  7:21 AM  Result Value Ref Range   Prothrombin Time 16.0 (H) 11.4 - 15.2 seconds   INR 1.3 (H) 0.8 - 1.2    Comment: (NOTE) INR goal varies based on device and disease states. Performed at [facility], [address] [place], [place] [address]   hCG, serum, qualitative     Status: None   Collection Time: 01/29/19  6:19 PM  Result Value Ref Range   Preg, Serum NEGATIVE NEGATIVE    Comment:        THE SENSITIVITY OF  THIS METHODOLOGY IS >10 mIU/mL. Performed at [facility], [address], [place], [place] [address]   CBC     Status: Abnormal   Collection Time: 01/30/19  3:09 AM  Result Value Ref Range   WBC 7.8 4.0 - 10.5 K/uL   RBC 3.80 (L) 3.87 - 5.11 MIL/uL   Hemoglobin 10.1 (L) 12.0 - 15.0 g/dL   HCT 92.4 (L) 26.8 - 34.1 %   MCV 80.0 80.0 - 100.0 fL   MCH 26.6 26.0 - 34.0 pg   MCHC 33.2 30.0 - 36.0 g/dL   RDW 96.2 22.9 - 79.8 %   Platelets 627 (H) 150 - 400 K/uL   nRBC 0.0 0.0 - 0.2 %    Comment: Performed at [facility], [address] [place], [place] [address]  Basic metabolic panel     Status: Abnormal   Collection Time: 01/30/19  3:09 AM  Result Value Ref Range   Sodium 140 135 - 145 mmol/L   Potassium 3.9 3.5 - 5.1 mmol/L   Chloride 108 98 - 111 mmol/L   CO2 21 (L) 22 - 32 mmol/L   Glucose, Bld 87 70 - 99 mg/dL   BUN <5 (L) 6 - 20 mg/dL   Creatinine, Ser 4.17 0.44 - 1.00 mg/dL   Calcium 8.8 (L) 8.9 - 10.3 mg/dL   GFR calc non Af Amer >60 >60 mL/min   GFR calc Af Amer >60 >60 mL/min   Anion gap 11 5 - 15    Comment: Performed at [facility], [address], [place], [place] [address]    DG CHEST PORT 1 VIEW  Result Date: 01/29/2019 CLINICAL DATA:  Hypoxia, chest pain, thoracentesis 01/25/2019 EXAM: PORTABLE CHEST 1 VIEW COMPARISON:  Portable exam 1729 hours compared to 01/25/2019 and correlated with CT chest of 01/24/2019 FINDINGS: Normal heart size mediastinal contours. Small to moderate LEFT and tiny RIGHT pleural effusions. Bibasilar atelectasis greater on LEFT. Patchy infiltrates in the upper lobes again identified. Nodular foci in the lungs on the prior CT exam are not well demonstrated radiographically. No pneumothorax or acute osseous findings. IMPRESSION: Increased LEFT pleural effusion and new tiny RIGHT pleural effusion since 01/25/2019 with associated bibasilar atelectasis. Pulmonary nodules identified on prior CT much less well visualized  radiographically. Electronically Signed   By: Ulyses SouthwardMark  Boles M.D.   On: 01/29/2019 17:50   IR ABDOMEN US LIMITED  Result Date: 01/30/2019 CLINICAL DATA:  28 year old female with possible extrapulmonary tuberculosis and a recent history of ascites. Evaluate for recurrent ascites. EXAM: LIMITED ABDOMEN ULTRASOUND FOR ASCITES TECHNIQUE: Limited ultrasound survey for ascites was performed in all four abdominal quadrants. COMPARISON:  Prior paracentesis 01/26/2019 FINDINGS: Ultrasound was used to interrogate the abdomen. There is trace recurrent ascites which is insufficient for paracentesis. IMPRESSION: Trace recurrent ascites, insufficient for paracentesis. Electronically Signed   By: Malachy MoanHeath  McCullough M.D.   On: 01/30/2019 10:20   IR US CHEST  Result Date: 01/30/2019 CLINICAL DATA:  28 year old female with possible recurrent left pleural effusion and ascites. EXAM: CHEST ULTRASOUND COMPARISON:  Left thoracentesis 01/24/2019 FINDINGS: Ultrasound was used to interrogate the left chest. There is no significant recurrent pleural effusion. IMPRESSION: Negative for significant recurrent left pleural effusion. Thoracentesis was not performed. Electronically Signed   By: Malachy MoanHeath  McCullough M.D.   On: 01/30/2019 08:14   US PELVIC COMPLETE WITH TRANSVAGINAL  Result Date: 01/29/2019 CLINICAL DATA:  Serpiginous cystic appearing structure in the left pelvis on a recent abdomen and pelvis CT, concerning for a possible hydrosalpinx or pyosalpinx. The patient also had amorphous soft tissue in the pelvis with indistinct ovaries on the CT. She also had omental caking and above water density free peritoneal fluid on the CT, suspicious for carcinomatosis or TB peritonitis. EXAM: TRANSABDOMINAL AND TRANSVAGINAL ULTRASOUND OF PELVIS TECHNIQUE: Both transabdominal and transvaginal ultrasound examinations of the pelvis were performed. Transabdominal technique was performed for global imaging of the pelvis including uterus,  ovaries, adnexal regions, and pelvic cul-de-sac. It was necessary to proceed with endovaginal exam following the transabdominal exam to visualize the uterus and ovaries in better detail. COMPARISON:  Abdomen and pelvis CT dated 01/24/2019. FINDINGS: Uterus Measurements: 5.3 x 4.0 x 3.1 cm = volume: 34 mL. No fibroids or other mass visualized. Endometrium Thickness: 10.0 mm.  No focal abnormality visualized. Right ovary Measurements: 4.2 x 2.6 x 2.4 cm = volume: 13 mL. Normal appearance/no adnexal mass. Left ovary Measurements: 4.2 x 3.1 x 3.0 cm = volume: 20 mL. Normal appearance/no adnexal mass. Other findings Moderately large amount of free peritoneal fluid containing diffuse low-level internal echoes. There is also a multiloculated, near anechoic fluid collection in the left adnexal region extending superiorly into the free peritoneal fluid. This corresponds to the abnormality on the recent CT. IMPRESSION: 1. Multiloculated, near anechoic fluid collection in the left adnexal region, extending superiorly into a moderately large amount of complicated free peritoneal fluid. Differential considerations include a large hydrosalpinx, multiloculated fluid collection associated with peritonitis or multiloculated fluid collection associated with carcinomatosis. 2. Moderately large amount of complicated free peritoneal fluid. This could represent infected, hemorrhagic or proteinaceous fluid. 3. Normal appearing uterus and ovaries. Electronically Signed   By: Beckie SaltsSteven  Reid M.D.   On: 01/29/2019 20:48    Assessment/Plan: Left pyosalpinx vs urogenital TB  Will add antibiotic treatment for pyosalpinx. If this is urogenital TB involving the tube, treat is the same as pulmonary TB. She is non tender on pelvic exam, so would treat with IV antibiotics while in hospital. Can switch to oral ( Flagyl and Doxycyline for total of 14 days) once discharged home. Will follow with you.  Nancy Hurst 01/30/2019

## 2019-01-30 NOTE — Progress Notes (Signed)
[facility] Pharm[facility] Progress Note    I spoke with Ms. Joannie Springs via interpretor on the phone this afternoon and discussed her medication regimen for tuberculosis with her. I reviewed side effects including orange secretions from rifampin, peripheral neuropathy, and possible liver damage.    She expressed understanding. I told her if she had any questions at all or concerns she could call the ID office or discuss with the health department.    Jimmy Footman, PharmD, BCPS, BCIDP Infectious Diseases Clinical Pharmacist Phone: [phone] 01/30/2019 1:40 PM

## 2019-01-31 ENCOUNTER — Inpatient Hospital Stay (HOSPITAL_COMMUNITY): Payer: Self-pay

## 2019-01-31 DIAGNOSIS — E440 Moderate protein-calorie malnutrition: Secondary | ICD-10-CM

## 2019-01-31 DIAGNOSIS — A199 Miliary tuberculosis, unspecified: Principal | ICD-10-CM

## 2019-01-31 LAB — COMPREHENSIVE METABOLIC PANEL
ALT: 13 U/L (ref 0–44)
AST: 16 U/L (ref 15–41)
Albumin: 3.1 g/dL — ABNORMAL LOW (ref 3.5–5.0)
Alkaline Phosphatase: 40 U/L (ref 38–126)
Anion gap: 8 (ref 5–15)
BUN: 7 mg/dL (ref 6–20)
CO2: 21 mmol/L — ABNORMAL LOW (ref 22–32)
Calcium: 8.6 mg/dL — ABNORMAL LOW (ref 8.9–10.3)
Chloride: 107 mmol/L (ref 98–111)
Creatinine, Ser: 0.78 mg/dL (ref 0.44–1.00)
GFR calc Af Amer: >60 mL/min (ref >60)
GFR calc non Af Amer: >60 mL/min (ref >60)
Glucose, Bld: 82 mg/dL (ref 70–99)
Potassium: 4.3 mmol/L (ref 3.5–5.1)
Sodium: 136 mmol/L (ref 135–145)
Total Bilirubin: 0.8 mg/dL (ref 0.3–1.2)
Total Protein: 6.4 g/dL — ABNORMAL LOW (ref 6.5–8.1)

## 2019-01-31 LAB — CBC
HCT: 28.6 % — ABNORMAL LOW (ref 36.0–46.0)
Hemoglobin: 9.5 g/dL — ABNORMAL LOW (ref 12.0–15.0)
MCH: 26.8 pg (ref 26.0–34.0)
MCHC: 33.2 g/dL (ref 30.0–36.0)
MCV: 80.6 fL (ref 80.0–100.0)
Platelets: 638 10*3/uL — ABNORMAL HIGH (ref 150–400)
RBC: 3.55 MIL/uL — ABNORMAL LOW (ref 3.87–5.11)
RDW: 14.5 % (ref 11.5–15.5)
WBC: 7.8 10*3/uL (ref 4.0–10.5)
nRBC: 0 % (ref 0.0–0.2)

## 2019-01-31 LAB — CULTURE, BODY FLUID W GRAM STAIN -BOTTLE: Culture: NO GROWTH

## 2019-01-31 MED ORDER — RIFAMPIN 300 MG PO CAPS: 600.0000 mg | ORAL_CAPSULE | Freq: Every day | ORAL | 0 refills | Status: AC

## 2019-01-31 MED ORDER — PYRAZINAMIDE 500 MG PO TABS: 1000.0000 mg | ORAL_TABLET | Freq: Every day | ORAL | 0 refills | Status: AC

## 2019-01-31 MED ORDER — IOHEXOL 300 MG/ML  SOLN
100.0000 mL | Freq: Once | INTRAMUSCULAR | Status: AC | PRN
  Administered 2019-01-31: 100 mL via INTRAVENOUS

## 2019-01-31 MED ORDER — PYRIDOXINE HCL 50 MG PO TABS: 50.0000 mg | ORAL_TABLET | Freq: Every day | ORAL | 0 refills | Status: AC

## 2019-01-31 MED ORDER — ETHAMBUTOL HCL 400 MG PO TABS: 800.0000 mg | ORAL_TABLET | Freq: Every day | ORAL | 0 refills | Status: AC

## 2019-01-31 MED ORDER — ISONIAZID 300 MG PO TABS: 300.0000 mg | ORAL_TABLET | Freq: Every day | ORAL | 0 refills | Status: AC

## 2019-01-31 MED FILL — rifAMPin 300 MG CAPS: 300 | 6 days supply | Qty: 12 | Fill #0

## 2019-01-31 MED FILL — MYAMBUTOL 400 MG TABLET: 400 | 6 days supply | Qty: 12 | Fill #0

## 2019-01-31 MED FILL — PYRAZINAMIDE 500 MG TABLET: 500 | 6 days supply | Qty: 12 | Fill #0

## 2019-01-31 MED FILL — VITAMIN B6 50 MG TABS: 50 | 6 days supply | Qty: 6 | Fill #0

## 2019-01-31 MED FILL — ISONIAZID 300 MG TABLET: 300 | 6 days supply | Qty: 6 | Fill #0

## 2019-01-31 NOTE — Progress Notes (Signed)
PROGRESS NOTE  Nancy Hurst NWG:[id number] DOB: 12-11-1990 DOA: 01/23/2019 PCP: Patient, No Pcp Per  HPI/Recap of past 24 hours: 28 year old Spanish speaking female with no medical problems presented to the emergency room with cough and pleuritic chest pain for about 2 to 3 weeks duration.  Recently treated for ESBL UTI.  She went to urgent care center where x-rays showed pleural effusion, was started on doxycycline.  She also felt some abdominal discomfort.  Patient did report subjective fever and chills at night. In the emergency room, hemodynamically stable.  CT scan of the chest with loculated left-sided pleural effusion, large volume abdominal ascites and omental caking.  Post thoracentesis and paracentesis by IR.  High suspicion for disseminated TB.  AFB cultures still in process.  Recurrent fevers.  Infectious disease following.  Subjective: No acute issues or events overnight, evaluated with interpreter.  Denies nausea, vomiting, diarrhea, constipation.  Indicates ongoing pleuritic chest pain with cough and deep inspiration, mild abdominal pain on palpation   Assessment/Plan: Principal Problem:   Loculated pleural effusion Active Problems:   Recurrent UTI   Abdominal ascites   Normocytic anemia   Moderate protein-calorie malnutrition (HCC)   Fever   Biological false positive RPR test   Disseminated tuberculosis   Observation for suspected tuberculosis (TB)   Suspected disseminated tuberculosis Unlikely pyosalpinx -Independently reviewed CT chest 12/16 which showed large likely loculated left sided pleural effusion, bilateral pulmonary nodules, bronchiectasis upper lobes with areas of calcification.  CT abdomen and pelvis also showed concern for disseminated tuberculosis. -QuantiFERON gold plus nonreactive -AFB smear negative for peritoneal fluid and for pleural fluid. -Acid-fast culture in process.  Continue to follow cultures. -Seen by infectious disease,  following.  Started on RIPE 12/22 for presumed disseminated TB -Repeat CT today with contrast, if abdominal lesion is not enlarging given patient has not really been on antibiotics this leans more towards disseminated tuberculosis which seems to be a more unifying diagnosis at this point rather than disseminated GU or GI infection. -Unfortunately neither OB/GYN nor IR were able to get tissue samples from this pelvic lesion concerning for infection.  Given negative results as above IR has offered for omentum core biopsy in hopes to help with diagnosis however as a biopsy omentum at this point will not change clinical course we will not order given the procedure is not without risks or side effects.  Likely urogenital TB given above Less likely secondary infection -Findings seen on CT abdomen pelvis and confirmed with complete transvaginal pelvic ultrasound. -Repeat CT 12/23 - if lesion is not enlarging would be more consistent with TB -Gynecology consulted and following. -Likely to DC flagyl/doxy given above and attempting to avoid placing patient on 6 different antibiotics without definitive diagnosis.  Recurrent fevers, suspect infection related with possible disseminated TB Patient has nightly fevers, consistent with TB diagnosis Blood CX x2 obtained on 12/19 negative to date.   Continue to follow cultures. Continue symptomatic treatment.  False positive syphilis test Non treponemal test RPR reactive Treponemal test non reactive, titer unremarkable. HIV nonreactive  Resolved hypotension Blood pressure stable Discontinued IV fluids normal saline at 75 cc/h on 12/22  Normocytic anemia Hemoglobin stable and trending up 10.1 No sign of overt bleeding  Loculated left pleural effusion status post left thoracentesis 12/17 800 cc fluid removed post left thoracentesis 12/17 Gram stain no organisms seen Follow AFB culture as stated above.  Abdominal ascites post paracentesis 12/18 1.3 L  of peritoneal fluid removed post paracentesis 12/18  Acute hepatitis panel negative Follow AFB culture as stated above.  Severe protein calorie malnutrition with muscular wasting Albumin 2.7 BMI 18 Continue oral supplement  DVT prophylaxis: SCDs and subcu Lovenox daily. Code Status: Full code Family Communication: None  Disposition Plan:  Likely discharge home in next 24 to 48 hours pending clinical course, repeat imaging and ability to ensure safe discharge for the patient including follow-up appointments, antibiotics and medications  Consultants:   Infectious disease  Gynecology  Procedures:   Thoracentesis, 12/17  Paracentesis, 12/18  Complete transvaginal pelvic ultrasound 12/21   Objective: Vitals:   01/31/19 0520 01/31/19 0600 01/31/19 0649 01/31/19 0812  BP: 106/66   107/71  Pulse:    74  Resp: (!) 28  Temp: (!) 101.8 F (38.8 C)  98.6 F (37 C) 98.2 F (36.8 C)  TempSrc: Oral  Oral Oral  SpO2: 94%   97%  Weight:      Height:        Intake/Output Summary (Last 24 hours) at 01/31/2019 1052 Last data filed at 01/31/2019 0524 Gross per 24 hour  Intake 1578.75 ml  Output --  Net 1578.75 ml   Filed Weights   01/24/19 2241  Weight: 48 kg    Exam:  . General: 28 y.o. year-old female Frail-appearing no acute distress.  Alert and oriented x3.  Cardiovascular: Regular rate and rhythm no rubs or gallops. Marland Kitchen Respiratory: No respiratory no wheezing noted. . Abdomen: Distended abdomen bowel sounds present. Musculoskeletal: No lower extremity edema.    Data Reviewed:  CBC: Recent Labs  Lab 01/25/19 1307 01/26/19 0714 01/28/19 0325 01/28/19 0729 01/29/19 0554 01/30/19 0309 01/31/19 0311  WBC 7.6 6.3 6.3  --  6.7 7.8 7.8  NEUTROABS 6.1 4.2  --   --  4.9  --   --   HGB 11.1* 10.8* 8.1* 9.2* 9.6* 10.1* 9.5*  HCT 34.1* 32.7* 24.6* 27.5* 29.4* 30.4* 28.6*  MCV 82.8 81.5 82.0  --  82.6 80.0 80.6  PLT 694* 644* 530*  --  556* 627* 638*    Basic Metabolic Panel: Recent Labs  Lab 01/26/19 0714 01/28/19 0325 01/29/19 0721 01/30/19 0309 01/31/19 0311  NA 136 139 138 140 136  K 4.3 3.5 3.4* 3.9 4.3  CL 106 108 112* 108 107  CO2 21* 19* 20* 21* 21*  GLUCOSE 90 90 95 87 82  BUN <5* <5* 5* <5* 7  CREATININE 0.61 0.55 0.55 0.62 0.78  CALCIUM 8.3* 8.7* 8.4* 8.8* 8.6*   GFR: Estimated Creatinine Clearance: 80 mL/min (by C-G formula based on SCr of 0.78 mg/dL). Liver Function Tests: Recent Labs  Lab 01/25/19 1307 01/28/19 0325 01/29/19 0721 01/31/19 0311  AST ALT ALKPHOS 41 33* 33* 40  BILITOT 0.3 0.4 0.5 0.8  PROT 7.0 7.1 6.1* 6.4*  ALBUMIN 2.7* 4.6 3.4* 3.1*   No results for input(s): LIPASE, AMYLASE in the last 168 hours. No results for input(s): AMMONIA in the last 168 hours. Coagulation Profile: Recent Labs  Lab 01/29/19 0721  INR 1.3*   Urine analysis:    Component Value Date/Time   COLORURINE YELLOW 01/24/2019 0038   APPEARANCEUR HAZY (A) 01/24/2019 0038   LABSPEC 1.020 01/24/2019 0038   PHURINE 5.0 01/24/2019 0038   GLUCOSEU NEGATIVE 01/24/2019 0038   HGBUR SMALL (A) 01/24/2019 0038   BILIRUBINUR NEGATIVE 01/24/2019 0038   KETONESUR 5 (A) 01/24/2019 0038   PROTEINUR NEGATIVE 01/24/2019 0038  NITRITE NEGATIVE 01/24/2019 0038   LEUKOCYTESUR NEGATIVE 01/24/2019 0038    Recent Results (from the past 240 hour(s))  Blood culture (routine x 2)     Status: None   Collection Time: 01/24/19  4:38 AM   Specimen: BLOOD RIGHT HAND  Result Value Ref Range Status   Specimen Description BLOOD RIGHT HAND  Final   Special Requests   Final    BOTTLES DRAWN AEROBIC AND ANAEROBIC Blood Culture results may not be optimal due to an inadequate volume of blood received in culture bottles   Culture   Final    NO GROWTH 5 DAYS Performed at [facility], [address] [place], [place] [address]    Report Status 01/29/2019 FINAL  Final  Blood culture (routine x 2)     Status:  None   Collection Time: 01/24/19  4:38 AM   Specimen: BLOOD  Result Value Ref Range Status   Specimen Description BLOOD LEFT ANTECUBITAL  Final   Special Requests   Final    BOTTLES DRAWN AEROBIC AND ANAEROBIC Blood Culture adequate volume   Culture   Final    NO GROWTH 5 DAYS Performed at [facility], [address] [place], [place] [address]    Report Status 01/29/2019 FINAL  Final  SARS CORONAVIRUS 2 (TAT 6-24 HRS) Nasopharyngeal Nasopharyngeal Swab     Status: None   Collection Time: 01/24/19  8:53 AM   Specimen: Nasopharyngeal Swab  Result Value Ref Range Status   SARS Coronavirus 2 NEGATIVE NEGATIVE Final    Comment: (NOTE) SARS-CoV-2 target nucleic acids are NOT DETECTED. The SARS-CoV-2 RNA is generally detectable in upper and lower respiratory specimens during the acute phase of infection. Negative results do not preclude SARS-CoV-2 infection, do not rule out co-infections with other pathogens, and should not be used as the sole basis for treatment or other patient management decisions. Negative results must be combined with clinical observations, patient history, and epidemiological information. The expected result is Negative. Fact Sheet for Patients: HairSlick.nohttps://www.fda.gov/media/138098/download Fact Sheet for Healthcare Providers: quierodirigir.comhttps://www.fda.gov/media/138095/download This test is not yet approved or cleared by the [place] FDA and  has been authorized for detection and/or diagnosis of SARS-CoV-2 by FDA under an Emergency Use Authorization (EUA). This EUA will remain  in effect (meaning this test can be used) for the duration of the COVID-19 declaration under Section 56 4(b)(1) of the Act, 21 U.S.C. section 360bbb-3(b)(1), unless the authorization is terminated or revoked sooner. Performed at [facility], [address] [place], [place] [address]   Gram stain     Status: None   Collection Time: 01/25/19  1:53 PM   Specimen: Lung, Left;  Pleural Fluid  Result Value Ref Range Status   Specimen Description PLEURAL LEFT  Final   Special Requests NONE  Final   Gram Stain   Final    ABUNDANT WBC PRESENT, PREDOMINANTLY MONONUCLEAR NO ORGANISMS SEEN Performed at [facility], [address] [place], [place] [address]    Report Status 01/25/2019 FINAL  Final  Fungus Culture With Stain     Status: None (Preliminary result)   Collection Time: 01/25/19  1:53 PM   Specimen: Lung, Left; Pleural Fluid  Result Value Ref Range Status   Fungus Stain Final report  Final    Comment: (NOTE) Performed At: [facility] [place] [address] [place], [place] [id number] Jolene SchimkeNagendra Sanjai MD MV:[phone]    Fungus (Mycology) Culture PENDING  Incomplete  Fungal Source PLEURAL  Final    Comment: LEFT Performed at [facility], [address], [place], [place] [address]   Acid Fast Smear (AFB)     Status: None   Collection Time: 01/25/19  1:53 PM   Specimen: Lung, Left; Pleural Fluid  Result Value Ref Range Status   AFB Specimen Processing Concentration  Final   Acid Fast Smear Negative  Final    Comment: (NOTE) Performed At: [facility] [address] [place], [place] [id number] Jolene Schimke MD SE:[id number]    Source (AFB) PLEURAL  Final    Comment: LEFT Performed at [facility], [address], [place], [place] [address]   Culture, body fluid-bottle     Status: None   Collection Time: 01/25/19  1:53 PM   Specimen: Pleura  Result Value Ref Range Status   Specimen Description PLEURAL LEFT  Final   Special Requests NONE  Final   Culture   Final    NO GROWTH 5 DAYS Performed at [facility], [address], [place], [place] [address]    Report Status 01/30/2019 FINAL  Final  Fungus Culture Result     Status: None   Collection Time: 01/25/19  1:53 PM  Result Value Ref Range Status   Result 1 Comment  Final    Comment: (NOTE) KOH/Calcofluor preparation:  no fungus observed. Performed At:  [facility] [address] [place], [place] [id number] Jolene Schimke MD KK:[id number]   Gram stain     Status: None   Collection Time: 01/26/19  3:41 PM   Specimen: Abdomen; Peritoneal Fluid  Result Value Ref Range Status   Specimen Description FLUID PERITONEAL  Final   Special Requests NONE  Final   Gram Stain   Final    NO WBC SEEN NO ORGANISMS SEEN Performed at [facility], [address], [place], [place] [address]    Report Status 01/26/2019 FINAL  Final  Fungus Culture With Stain     Status: None (Preliminary result)   Collection Time: 01/26/19  3:41 PM   Specimen: Abdomen; Peritoneal Fluid  Result Value Ref Range Status   Fungus Stain Final report  Final    Comment: (NOTE) Performed At: [facility] [address] [place], [place] [id number] Jolene Schimke MD ZW:[id number]    Fungus (Mycology) Culture PENDING  Incomplete   Fungal Source FLUID  Final    Comment: PERITONEAL Performed at [facility], [address], [place], [place] [address]   Acid Fast Smear (AFB)     Status: None   Collection Time: 01/26/19  3:41 PM   Specimen: Abdomen; Peritoneal Fluid  Result Value Ref Range Status   AFB Specimen Processing Concentration  Final   Acid Fast Smear Negative  Final    Comment: (NOTE) Performed At: [facility] [address] [place], [place] [id number] Jolene Schimke MD QQ:[id number]    Source (AFB) FLUID  Final    Comment: PERITONEAL Performed at [facility], [address] [place], [place] [address]   Culture, body fluid-bottle     Status: None   Collection Time: 01/26/19  3:41 PM   Specimen: Fluid  Result Value Ref Range Status   Specimen Description FLUID PERITONEAL  Final   Special Requests BOTTLES DRAWN AEROBIC AND ANAEROBIC 10CC  Final   Culture   Final    NO GROWTH 5 DAYS Performed at [facility], [address], [place],  [place] [address]    Report Status 01/31/2019 FINAL  Final  Fungus  Culture Result     Status: None   Collection Time: 01/26/19  3:41 PM  Result Value Ref Range Status   Result 1 Comment  Final    Comment: (NOTE) KOH/Calcofluor preparation:  no fungus observed. Performed At: [facility] [place], [place] [id number] Rush Farmer MD KV:[id number]   Culture, blood (routine x 2)     Status: None (Preliminary result)   Collection Time: 01/27/19  4:45 PM   Specimen: BLOOD  Result Value Ref Range Status   Specimen Description BLOOD LEFT ANTECUBITAL  Final   Special Requests   Final    BOTTLES DRAWN AEROBIC AND ANAEROBIC Blood Culture adequate volume   Culture   Final    NO GROWTH 4 DAYS Performed at [facility], [address], [place], [place] [address]    Report Status PENDING  Incomplete  Culture, blood (routine x 2)     Status: None (Preliminary result)   Collection Time: 01/27/19  4:53 PM   Specimen: BLOOD LEFT HAND  Result Value Ref Range Status   Specimen Description BLOOD LEFT HAND  Final   Special Requests   Final    BOTTLES DRAWN AEROBIC AND ANAEROBIC Blood Culture adequate volume   Culture   Final    NO GROWTH 4 DAYS Performed at [facility], [place] [address], [place], [place] [address]    Report Status PENDING  Incomplete      Studies: No results found.  Scheduled Meds: . chlorpheniramine-HYDROcodone  5 mL Oral Q12H  . dextromethorphan-guaiFENesin  2 tablet Oral BID  . enoxaparin (LOVENOX) injection  40 mg Subcutaneous Daily  . ethambutol  800 mg Oral Daily  . isoniazid  300 mg Oral Daily  . polyethylene glycol  17 g Oral Daily  . Ensure Max Protein  11 oz Oral Daily  . pyrazinamide  1,000 mg Oral Daily  . vitamin B-6  50 mg Oral Daily  . rifampin  600 mg Oral Daily  . senna-docusate  2 tablet Oral BID    Continuous Infusions: . cefoTEtan (CEFOTAN) IV 2 g (01/30/19 2149)  . doxycycline (VIBRAMYCIN) IV 100 mg (01/31/19 0524)     LOS: 7 days   Little Ishikawa, DO [facility] Pager [phone]  If 7PM-7AM, please contact night-coverage [email or web address] Password [facility] 01/31/2019, 10:52 AM

## 2019-01-31 NOTE — Progress Notes (Addendum)
Subjective: No new complaints.   Antibiotics:  Anti-infectives (From admission, onward)   Start     Dose/Rate Route Frequency Ordered Stop   01/30/19 2200  cefoTEtan (CEFOTAN) 2 g in sodium chloride 0.9 % 100 mL IVPB     2 g 200 mL/hr over 30 Minutes Intravenous Every 12 hours 01/30/19 1703     01/30/19 1800  doxycycline (VIBRAMYCIN) 100 mg in sodium chloride 0.9 % 250 mL IVPB     100 mg 125 mL/hr over 120 Minutes Intravenous Every 12 hours 01/30/19 1703     01/30/19 1100  rifampin (RIFADIN) capsule 600 mg     600 mg Oral Daily 01/30/19 1020     01/30/19 1000  isoniazid (NYDRAZID) tablet 300 mg     300 mg Oral Daily 01/30/19 0933     01/30/19 1000  rifampin (RIFADIN) capsule 450 mg  Status:  Discontinued     10 mg/kg/day  48 kg Oral Daily 01/30/19 0933 01/30/19 1020   01/30/19 1000  pyrazinamide tablet 1,000 mg     1,000 mg Oral Daily 01/30/19 0933     01/30/19 1000  ethambutol (MYAMBUTOL) tablet 800 mg     800 mg Oral Daily 01/30/19 0933        Medications: Scheduled Meds: . chlorpheniramine-HYDROcodone  5 mL Oral Q12H  . dextromethorphan-guaiFENesin  2 tablet Oral BID  . enoxaparin (LOVENOX) injection  40 mg Subcutaneous Daily  . ethambutol  800 mg Oral Daily  . isoniazid  300 mg Oral Daily  . polyethylene glycol  17 g Oral Daily  . Ensure Max Protein  11 oz Oral Daily  . pyrazinamide  1,000 mg Oral Daily  . vitamin B-6  50 mg Oral Daily  . rifampin  600 mg Oral Daily  . senna-docusate  2 tablet Oral BID   Continuous Infusions: . cefoTEtan (CEFOTAN) IV 2 g (01/31/19 1151)  . doxycycline (VIBRAMYCIN) IV 100 mg (01/31/19 0524)   PRN Meds:.acetaminophen **OR** acetaminophen, HYDROcodone-acetaminophen, lidocaine (PF), lidocaine, ondansetron **OR** ondansetron (ZOFRAN) IV    Objective: Weight change:   Intake/Output Summary (Last 24 hours) at 01/31/2019 1405 Last data filed at 01/31/2019 0524 Gross per 24 hour  Intake 978.75 ml  Output --  Net  978.75 ml   Blood pressure 107/71, pulse 74, temperature 98.2 F (36.8 C), temperature source Oral, resp. rate (!) 23, height 5\' 3"  (1.6 m), weight 48 kg, last menstrual period 01/09/2019, SpO2 97 %. Temp:  [98.2 F (36.8 C)-101.8 F (38.8 C)] 98.2 F (36.8 C) (12/23 0812) Pulse Rate:  [74-100] 74 (12/23 0812) Resp:  [18-28] 23 (12/23 1136) BP: (96-107)/(66-75) 107/71 (12/23 0812) SpO2:  [91 %-97 %] 97 % (12/23 16100812)  Physical Exam: General: Alert and awake, oriented x3, not in any acute distress. HEENT: anicteric sclera, EOMI CVS regular rate, normal  Chest: , no wheezing, no respiratory distress Abdomen: soft non-distended,  Extremities: no edema or deformity noted bilaterally Skin: no rashes Neuro: nonfocal  CBC:    BMET Recent Labs    01/30/19 0309 01/31/19 0311  NA 140 136  K 3.9 4.3  CL 108 107  CO2 21* 21*  GLUCOSE 87 82  BUN <5* 7  CREATININE 0.62 0.78  CALCIUM 8.8* 8.6*     Liver Panel  Recent Labs    01/29/19 0721 01/31/19 0311  PROT 6.1* 6.4*  ALBUMIN 3.4* 3.1*  AST 30 16  ALT 17 13  ALKPHOS 33* 40  BILITOT  0.5 0.8       Sedimentation Rate No results for input(s): ESRSEDRATE in the last 72 hours. C-Reactive Protein No results for input(s): CRP in the last 72 hours.  Micro Results: Recent Results (from the past 720 hour(s))  Blood culture (routine x 2)     Status: None   Collection Time: 01/24/19  4:38 AM   Specimen: BLOOD RIGHT HAND  Result Value Ref Range Status   Specimen Description BLOOD RIGHT HAND  Final   Special Requests   Final    BOTTLES DRAWN AEROBIC AND ANAEROBIC Blood Culture results may not be optimal due to an inadequate volume of blood received in culture bottles   Culture   Final    NO GROWTH 5 DAYS Performed at [facility], [place] [address] [place], [place] [address]    Report Status 01/29/2019 FINAL  Final  Blood culture (routine x 2)     Status: None   Collection Time: 01/24/19  4:38 AM   Specimen:  BLOOD  Result Value Ref Range Status   Specimen Description BLOOD LEFT ANTECUBITAL  Final   Special Requests   Final    BOTTLES DRAWN AEROBIC AND ANAEROBIC Blood Culture adequate volume   Culture   Final    NO GROWTH 5 DAYS Performed at [facility], [place] [address] [place], [place] [address]    Report Status 01/29/2019 FINAL  Final  SARS CORONAVIRUS 2 (TAT 6-24 HRS) Nasopharyngeal Nasopharyngeal Swab     Status: None   Collection Time: 01/24/19  8:53 AM   Specimen: Nasopharyngeal Swab  Result Value Ref Range Status   SARS Coronavirus 2 NEGATIVE NEGATIVE Final    Comment: (NOTE) SARS-CoV-2 target nucleic acids are NOT DETECTED. The SARS-CoV-2 RNA is generally detectable in upper and lower respiratory specimens during the acute phase of infection. Negative results do not preclude SARS-CoV-2 infection, do not rule out co-infections with other pathogens, and should not be used as the sole basis for treatment or other patient management decisions. Negative results must be combined with clinical observations, patient history, and epidemiological information. The expected result is Negative. Fact Sheet for Patients: SugarRoll.be Fact Sheet for Healthcare Providers: https://www.woods-mathews.com/ This test is not yet approved or cleared by the [place] FDA and  has been authorized for detection and/or diagnosis of SARS-CoV-2 by FDA under an Emergency Use Authorization (EUA). This EUA will remain  in effect (meaning this test can be used) for the duration of the COVID-19 declaration under Section 56 4(b)(1) of the Act, 21 U.S.C. section 360bbb-3(b)(1), unless the authorization is terminated or revoked sooner. Performed at [facility], [place] [address] [place], [place] [address]   Gram stain     Status: None   Collection Time: 01/25/19  1:53 PM   Specimen: Lung, Left; Pleural Fluid  Result Value Ref Range Status   Specimen  Description PLEURAL LEFT  Final   Special Requests NONE  Final   Gram Stain   Final    ABUNDANT WBC PRESENT, PREDOMINANTLY MONONUCLEAR NO ORGANISMS SEEN Performed at [facility], [place] [address], [place], [place] [address]    Report Status 01/25/2019 FINAL  Final  Fungus Culture With Stain     Status: None (Preliminary result)   Collection Time: 01/25/19  1:53 PM   Specimen: Lung, Left; Pleural Fluid  Result Value Ref Range Status   Fungus Stain Final report  Final    Comment: (NOTE) Performed At: [facility]  [place] [address] [place], [place] [id number] Jolene Schimke MD XB:[id number]    Fungus (Mycology) Culture PENDING  Incomplete   Fungal Source PLEURAL  Final    Comment: LEFT Performed at [facility], [address], [place], [place] [address]   Acid Fast Smear (AFB)     Status: None   Collection Time: 01/25/19  1:53 PM   Specimen: Lung, Left; Pleural Fluid  Result Value Ref Range Status   AFB Specimen Processing Concentration  Final   Acid Fast Smear Negative  Final    Comment: (NOTE) Performed At: [facility] [address] [place], [place] [id number] Jolene Schimke MD QQ:[id number]    Source (AFB) PLEURAL  Final    Comment: LEFT Performed at [facility], [address] [place], [place] [address]   Culture, body fluid-bottle     Status: None   Collection Time: 01/25/19  1:53 PM   Specimen: Pleura  Result Value Ref Range Status   Specimen Description PLEURAL LEFT  Final   Special Requests NONE  Final   Culture   Final    NO GROWTH 5 DAYS Performed at [facility], [address] [place], [place] [address]    Report Status 01/30/2019 FINAL  Final  Fungus Culture Result     Status: None   Collection Time: 01/25/19  1:53 PM  Result Value Ref Range Status   Result 1 Comment  Final    Comment: (NOTE) KOH/Calcofluor preparation:  no fungus observed. Performed At: [facility] [address] [place], [place]  [id number] Jolene Schimke MD FU:[id number]   Gram stain     Status: None   Collection Time: 01/26/19  3:41 PM   Specimen: Abdomen; Peritoneal Fluid  Result Value Ref Range Status   Specimen Description FLUID PERITONEAL  Final   Special Requests NONE  Final   Gram Stain   Final    NO WBC SEEN NO ORGANISMS SEEN Performed at [facility], [address] [place], [place] [address]    Report Status 01/26/2019 FINAL  Final  Fungus Culture With Stain     Status: None (Preliminary result)   Collection Time: 01/26/19  3:41 PM   Specimen: Abdomen; Peritoneal Fluid  Result Value Ref Range Status   Fungus Stain Final report  Final    Comment: (NOTE) Performed At: [facility] [address] [place], [place] [id number] Jolene Schimke MD [id number]    Fungus (Mycology) Culture PENDING  Incomplete   Fungal Source FLUID  Final    Comment: PERITONEAL Performed at [facility], [address], [place], [place] [address]   Acid Fast Smear (AFB)     Status: None   Collection Time: 01/26/19  3:41 PM   Specimen: Abdomen; Peritoneal Fluid  Result Value Ref Range Status   AFB Specimen Processing Concentration  Final   Acid Fast Smear Negative  Final    Comment: (NOTE) Performed At: [facility] [address] [place], [place] [id number] Jolene Schimke MD [id number]    Source (AFB) FLUID  Final    Comment: PERITONEAL Performed at [facility], [address] [place], [place] [address]   Culture, body fluid-bottle     Status: None   Collection Time: 01/26/19  3:41 PM   Specimen: Fluid  Result Value Ref Range Status   Specimen Description FLUID PERITONEAL  Final   Special Requests BOTTLES DRAWN AEROBIC AND ANAEROBIC 10CC  Final  Culture   Final    NO GROWTH 5 DAYS Performed at [facility], [address], [place], [place] [address]    Report Status 01/31/2019 FINAL  Final  Fungus Culture Result     Status: None   Collection Time:  01/26/19  3:41 PM  Result Value Ref Range Status   Result 1 Comment  Final    Comment: (NOTE) KOH/Calcofluor preparation:  no fungus observed. Performed At: [facility] [address] [place], [place] [id number] Jolene Schimke MD XB:[id number]   Culture, blood (routine x 2)     Status: None (Preliminary result)   Collection Time: 01/27/19  4:45 PM   Specimen: BLOOD  Result Value Ref Range Status   Specimen Description BLOOD LEFT ANTECUBITAL  Final   Special Requests   Final    BOTTLES DRAWN AEROBIC AND ANAEROBIC Blood Culture adequate volume   Culture   Final    NO GROWTH 4 DAYS Performed at [facility], [address] [place], [place] [address]    Report Status PENDING  Incomplete  Culture, blood (routine x 2)     Status: None (Preliminary result)   Collection Time: 01/27/19  4:53 PM   Specimen: BLOOD LEFT HAND  Result Value Ref Range Status   Specimen Description BLOOD LEFT HAND  Final   Special Requests   Final    BOTTLES DRAWN AEROBIC AND ANAEROBIC Blood Culture adequate volume   Culture   Final    NO GROWTH 4 DAYS Performed at [facility], [address] [place], [place] [address]    Report Status PENDING  Incomplete    Studies/Results: DG CHEST PORT 1 VIEW  Result Date: 01/29/2019 CLINICAL DATA:  Hypoxia, chest pain, thoracentesis 01/25/2019 EXAM: PORTABLE CHEST 1 VIEW COMPARISON:  Portable exam 1729 hours compared to 01/25/2019 and correlated with CT chest of 01/24/2019 FINDINGS: Normal heart size mediastinal contours. Small to moderate LEFT and tiny RIGHT pleural effusions. Bibasilar atelectasis greater on LEFT. Patchy infiltrates in the upper lobes again identified. Nodular foci in the lungs on the prior CT exam are not well demonstrated radiographically. No pneumothorax or acute osseous findings. IMPRESSION: Increased LEFT pleural effusion and new tiny RIGHT pleural effusion since 01/25/2019 with associated bibasilar atelectasis. Pulmonary  nodules identified on prior CT much less well visualized radiographically. Electronically Signed   By: Ulyses Southward M.D.   On: 01/29/2019 17:50   IR ABDOMEN US LIMITED  Result Date: 01/30/2019 CLINICAL DATA:  28 year old female with possible extrapulmonary tuberculosis and a recent history of ascites. Evaluate for recurrent ascites. EXAM: LIMITED ABDOMEN ULTRASOUND FOR ASCITES TECHNIQUE: Limited ultrasound survey for ascites was performed in all four abdominal quadrants. COMPARISON:  Prior paracentesis 01/26/2019 FINDINGS: Ultrasound was used to interrogate the abdomen. There is trace recurrent ascites which is insufficient for paracentesis. IMPRESSION: Trace recurrent ascites, insufficient for paracentesis. Electronically Signed   By: Malachy Moan M.D.   On: 01/30/2019 10:20   IR US CHEST  Result Date: 01/30/2019 CLINICAL DATA:  28 year old female with possible recurrent left pleural effusion and ascites. EXAM: CHEST ULTRASOUND COMPARISON:  Left thoracentesis 01/24/2019 FINDINGS: Ultrasound was used to interrogate the left chest. There is no significant recurrent pleural effusion. IMPRESSION: Negative for significant recurrent left pleural effusion. Thoracentesis was not performed. Electronically Signed   By: Malachy Moan M.D.   On: 01/30/2019 08:14   US PELVIC COMPLETE WITH TRANSVAGINAL  Result Date: 01/29/2019 CLINICAL DATA:  Serpiginous cystic appearing structure in the  left pelvis on a recent abdomen and pelvis CT, concerning for a possible hydrosalpinx or pyosalpinx. The patient also had amorphous soft tissue in the pelvis with indistinct ovaries on the CT. She also had omental caking and above water density free peritoneal fluid on the CT, suspicious for carcinomatosis or TB peritonitis. EXAM: TRANSABDOMINAL AND TRANSVAGINAL ULTRASOUND OF PELVIS TECHNIQUE: Both transabdominal and transvaginal ultrasound examinations of the pelvis were performed. Transabdominal technique was performed  for global imaging of the pelvis including uterus, ovaries, adnexal regions, and pelvic cul-de-sac. It was necessary to proceed with endovaginal exam following the transabdominal exam to visualize the uterus and ovaries in better detail. COMPARISON:  Abdomen and pelvis CT dated 01/24/2019. FINDINGS: Uterus Measurements: 5.3 x 4.0 x 3.1 cm = volume: 34 mL. No fibroids or other mass visualized. Endometrium Thickness: 10.0 mm.  No focal abnormality visualized. Right ovary Measurements: 4.2 x 2.6 x 2.4 cm = volume: 13 mL. Normal appearance/no adnexal mass. Left ovary Measurements: 4.2 x 3.1 x 3.0 cm = volume: 20 mL. Normal appearance/no adnexal mass. Other findings Moderately large amount of free peritoneal fluid containing diffuse low-level internal echoes. There is also a multiloculated, near anechoic fluid collection in the left adnexal region extending superiorly into the free peritoneal fluid. This corresponds to the abnormality on the recent CT. IMPRESSION: 1. Multiloculated, near anechoic fluid collection in the left adnexal region, extending superiorly into a moderately large amount of complicated free peritoneal fluid. Differential considerations include a large hydrosalpinx, multiloculated fluid collection associated with peritonitis or multiloculated fluid collection associated with carcinomatosis. 2. Moderately large amount of complicated free peritoneal fluid. This could represent infected, hemorrhagic or proteinaceous fluid. 3. Normal appearing uterus and ovaries. Electronically Signed   By: Beckie Salts M.D.   On: 01/29/2019 20:48      Assessment/Plan:  INTERVAL HISTORY:  Patient will not have surgical or IR interventions today.   Principal Problem:   Loculated pleural effusion Active Problems:   Recurrent UTI   Abdominal ascites   Normocytic anemia   Moderate protein-calorie malnutrition (HCC)   Fever   Biological false positive RPR test   Disseminated tuberculosis   Observation for  suspected tuberculosis (TB)    Nancy Hurst is a 28 y.o. female with high fevers lymphocytic exudative pleural effusion and peritonitis, multiple findings in the abdomen concerning for extrapulmonary TB including including possible gastric involvement multiple lymph nodes enlarged and soft tissue and adnexal pathology.  This latter item was also further evaluate a pelvic ultrasound which had shown a loculated fluid collection.  OB/GYN think this could be TB versus bacterial pyosalpinx, but they are not wanting to intervene to get tissue to help [place] understand if it is 1 versus the other.  Similarly IR cannot get at the area but have offered omental sampling.  We were repeating a CT scan today to see if the area in question is stable.  I favor a unifying diagnosis of extrapulmonary tuberculosis rather than a pyogenic bacterial process.  If CT scan is stable I would have the patient go out on 4 drug therapy for extrapulmonary tuberculosis.  And that it is now further in the day Wednesday she very well me it may need to be given drugs from [facility] health pharmacy since the health department may not be able to reach her house until next week if I understand.   Nancy Hurst has an appointment on February 21, 2019 at 9 AM  The [facility] is located in the [facility] at  [address] in [place].  [address], which is located to the left of the elevators.  Phone: [phone]  Fax: [phone]  [email or web address]  She should arrive 15 minutes prior to her appointment    .    LOS: 7 days   Acey Lav 01/31/2019, 2:05 PM

## 2019-01-31 NOTE — Progress Notes (Signed)
GYN Consult  Subjective: Patient reports no abd pain.   Objective: Tm 101.8 Lungs clear Heart RRR Abd soft + BS non tender   Assessment/Plan: Urogenital TB  Spoke to Dr Avon Gully this morning.Had hope that IR might be able to assist in obtaining pathological Dx. But unable to do so.  Doubtful bacteria infectious GYN process. Most likely TB as outline by IM and ID. Marland Kitchen Surgerical evaluation is not recommended until fails TB treatment or no resolution or change. Will discontinue antibiotic treatment for pyosalpinx. Will have pt seen in office in 4 weeks for follow and repeat GYN U/S.  Discussed with pt via video interrupter. Verbalized understanding.  Ok to discharge home form GYN standpoint when deemed by IM and ID. Will sign off for now.   LOS: 7 days    Chancy Milroy 01/31/2019, 6:21 PM

## 2019-01-31 NOTE — Discharge Summary (Addendum)
Physician Discharge Summary  Nancy Hurst IZT:[id number] DOB: September 12, 1990 DOA: 01/23/2019  PCP: Patient, No Pcp Per  Admit date: 01/23/2019 Discharge date: 02/01/2019  Admitted From: Home Disposition:  Home  Recommendations for Outpatient Follow-up:  1. Follow up with PCP in 1-2 weeks 2. Please obtain BMP/CBC in one week  Discharge Condition:Stable  CODE STATUS:Full  Diet recommendation: As tolerate   Brief/Interim Summary: 28 year old Spanish speaking female with no medical problems presented to the emergency room with cough and pleuritic chest pain for about 2 to 3 weeks duration. Recently treated for ESBL UTI. She went to urgent care center where x-rays showed pleural effusion, was started on doxycycline. She also felt some abdominal discomfort. Patient did report subjective fever and chills at night. In the emergency room, hemodynamically stable. CT scan of the chest with loculated left-sided pleural effusion, large volume abdominal ascites and omental caking.  Post thoracentesis and paracentesis by IR.  High suspicion for disseminated TB.  AFB cultures still in process.  Recurrent fevers.  Infectious disease following.  All cultures remain negative to date.  After multiple imaging modalities and testing ID OB/GYN and myself are all in agreement this is likely disseminated TB. Unfortunately we are unable to obtain a sample of these lesions which would help with identification, thoracentesis, paracentesis continue to be unremarkable.  Heme-onc consulted given repeat CT was read as concerning for metastatic/neoplastic although no primary has it to be located despite imaging below.  We will discharge patient on RIPE antibiotic therapy per TB protocols.  ID is graciously going to follow the patient in the outpatient setting.  At this time patient is awaiting assistance with medications given cost issues.  If we are able to obtain antibiotics for the patient today patient will  be discharged home with close monitoring by ID and has follow-up with OB/GYN as well.  We discussed that the patient will probably continue to have recurrent fevers and feel unwell for the next few weeks as this is a slow healing infection.  Gust that should the patient acutely worsen, become more weak more short of breath worsening fevers or chills or clinically feeling more unwell would recommend return to the ED immediately. We continue to await cultures for formal diagnosis of TB but given patient's symptoms and imaging we will continue forward as above.  Patient otherwise stable and excited for discharge home.  Discharge Diagnoses:  Principal Problem:   Disseminated tuberculosis Active Problems:   Recurrent UTI   Loculated pleural effusion   Abdominal ascites   Normocytic anemia   Moderate protein-calorie malnutrition (HCC)   Fever   Biological false positive RPR test   Observation for suspected tuberculosis (TB)  Suspected disseminated tuberculosis Unlikely pyosalpinx - Independently reviewed CT chest 12/16 which showed large likely loculated left sided pleural effusion, bilateral pulmonary nodules, bronchiectasis upper lobes with areas of calcification.  CT abdomen and pelvis also showed concern for disseminated tuberculosis. - QuantiFERON gold plus non-reactive. - AFB smear negative for peritoneal fluid and for pleural fluid. - Acid-fast culture in process.  Continue to follow cultures. - Seen by infectious disease, following.  Started on RIPE 12/22 for presumed disseminated TB. - Repeat CT no acute process -radiology concerning for metastatic or neoplastic process, heme-onc sidelined -hold off on routine cancer markers, if possible obtain biopsy, which is unfortunately unable to be performed as below. - Unfortunately neither OB/GYN nor IR were able to get tissue samples from this pelvic lesion concerning for infection. Given negative results  as above IR has offered for omentum core  biopsy in hopes to help with diagnosis however as a biopsy omentum at this point will not change clinical course we will not order given the procedure is not without risks or side effects.  Likely urogenital TB given above Less likely secondary infection - Findings seen on CT abdomen pelvis and confirmed with complete transvaginal pelvic ultrasound. - Repeat CT 12/23 - lesion appears to be stable - Gynecology consulted and following - no further indication for abx or intervention from their standpoint  Recurrent fevers, suspect infection related with possible disseminated TB, resolving Afebrile >24h Blood CX x2 obtained on 12/19 negative to date Continue to follow cultures Continue symptomatic treatment  False positive syphilis test Non treponemal test RPR reactive Treponemal test non reactive, titer unremarkable. HIV nonreactive  Resolved hypotension Blood pressure stable  Normocytic anemia Stable  Loculated left pleural effusion status post left thoracentesis 12/17 800 cc fluid removed post left thoracentesis 12/17 Gram stain no organisms seen Follow AFB culture as stated above  Abdominal ascites post paracentesis 12/18 1.3 L of peritoneal fluid removed post paracentesis 12/18 Acute hepatitis panel negative Follow AFB culture as stated above.  Severe protein calorie malnutrition with muscular wasting Albumin 2.7 BMI 18 Continue oral supplement  Discharge Instructions  Discharge Instructions    Call MD for:  difficulty breathing, headache or visual disturbances   Complete by: As directed    Call MD for:  extreme fatigue   Complete by: As directed    Call MD for:  hives   Complete by: As directed    Call MD for:  persistant dizziness or light-headedness   Complete by: As directed    Call MD for:  persistant nausea and vomiting   Complete by: As directed    Call MD for:  severe uncontrolled pain   Complete by: As directed    Call MD for:  temperature  >100.4   Complete by: As directed    Diet - low sodium heart healthy   Complete by: As directed    Increase activity slowly   Complete by: As directed      Allergies as of 02/01/2019   No Known Allergies     Medication List    STOP taking these medications   doxycycline 100 MG capsule Commonly known as: VIBRAMYCIN     TAKE these medications   acetaminophen 500 MG tablet Commonly known as: TYLENOL Take 1,000 mg by mouth every 6 (six) hours as needed for mild pain.   dextromethorphan-guaiFENesin 30-600 MG 12hr tablet Commonly known as: MUCINEX DM Take 2 tablets by mouth 2 (two) times daily.   ethambutol 400 MG tablet Commonly known as: MYAMBUTOL Take 2 tablets (800 mg total) by mouth daily for 6 days.   isoniazid 300 MG tablet Commonly known as: NYDRAZID Take 1 tablet (300 mg total) by mouth daily for 6 days.   pyrazinamide 500 MG tablet Take 2 tablets (1,000 mg total) by mouth daily for 6 days.   pyridOXINE 50 MG tablet Commonly known as: B-6 Take 1 tablet (50 mg total) by mouth daily for 6 days.   rifampin 300 MG capsule Commonly known as: RIFADIN Take 2 capsules (600 mg total) by mouth daily for 6 days.      Follow-up Information    [facility]. Schedule an appointment as soon as possible for a visit in 4 week(s).   Specialty: Obstetrics and Gynecology Why: Hospital follow up Contact  information: [address], [id number]c [place] [address] [phone]         Infectious Disease follow up:  February 21, 2019 at 9 AM The [facility] for Infectious Disease is located in the [facility] at [address] in [place]. [address], which is located to the left of the elevators. Phone: [phone] Fax: [phone] [email or web address]  She should arrive 15 minutes prior to her appointment   No Known  Allergies  Consultations:  Infectious disease, OB/GYN, IR, Heme-Onc   Procedures/Studies: DG Chest 1 View  Result Date: 01/25/2019 CLINICAL DATA:  Follow-up left-sided pleural effusion. Status post thoracentesis EXAM: CHEST  1 VIEW COMPARISON:  Chest x-ray 01/23/2019 and CT scan 01/24/2019 FINDINGS: Interval evacuation of the left pleural effusion. No significant residual pleural fluid is identified. There is moderate left lower lobe atelectasis. Persistent nodular interstitial process in the lungs. No postprocedural pneumothorax. IMPRESSION: 1. Evacuation of left pleural effusion status post thoracentesis. No postprocedural pneumothorax. 2. Stable diffuse nodular interstitial process in the lungs. 3. Left lower lobe atelectasis. Electronically Signed   By: Rudie Meyer M.D.   On: 01/25/2019 17:14   DG Chest 2 View  Result Date: 01/23/2019 CLINICAL DATA:  Chest pain EXAM: CHEST - 2 VIEW COMPARISON:  January 22, 2019 FINDINGS: There is a persistent left pleural effusion. There is persistent scarring in the upper lobe regions with ill-defined opacity in the left apex, stable. No new opacity evident. Heart size and pulmonary vascularity are normal. No adenopathy appreciable. No bone lesions. IMPRESSION: Persistent left pleural effusion. Scarring in the upper lobe regions with asymmetric opacity in the left upper lobe toward the apex. Question focal pneumonia in this area. Given the asymmetry in the left apex compared to the right, correlation with chest CT, ideally with intravenous contrast, may be advised to further evaluate. Heart size normal.  No adenopathy evident by radiography. Electronically Signed   By: Bretta Bang III M.D.   On: 01/23/2019 17:05   DG Chest 2 View  Result Date: 01/22/2019 CLINICAL DATA:  Back pain, cough EXAM: CHEST - 2 VIEW COMPARISON:  None. FINDINGS: The heart size and mediastinal contours are within normal limits. Moderate left pleural effusion and associated  atelectasis or consolidation. The visualized skeletal structures are unremarkable. IMPRESSION: Moderate left pleural effusion and associated atelectasis or consolidation. Electronically Signed   By: Lauralyn Primes M.D.   On: 01/22/2019 13:12   CT Chest W Contrast  Result Date: 01/24/2019 CLINICAL DATA:  Pleural effusion. Cough. EXAM: CT CHEST WITH CONTRAST TECHNIQUE: Multidetector CT imaging of the chest was performed during intravenous contrast administration. CONTRAST:  75mL OMNIPAQUE IOHEXOL 300 MG/ML  SOLN COMPARISON:  CT of the pelvis dated 11/03/2017. Chest x-ray from same day. FINDINGS: Cardiovascular: There is no large centrally located pulmonary embolism. Detection of smaller pulmonary emboli is limited by technique and contrast timing. The heart size is normal. There is a trace pericardial effusion. There is no thoracic aortic aneurysm or dissection. Mediastinum/Nodes: There is partial collapse of the left upper lobe. There is bilateral upper lobe bronchiectasis. --there are slightly prominent hilar and mediastinal lymph nodes, most notably involving the left hilum. --No axillary lymphadenopathy. --No supraclavicular lymphadenopathy. --Normal thyroid gland. --The esophagus is unremarkable Lungs/Pleura: There is a large likely loculated left-sided pleural effusion. There is bronchiectasis and architectural distortion of the bilateral upper lobes. There are areas of calcification within the bilateral upper lobes. There are pulmonary nodules bilaterally  measuring up to approximately 1.4 cm. There is consolidation in the bilateral upper lobes. There appear to be bilateral micro nodules there is no pneumothorax. Upper Abdomen: The upper abdomen demonstrates a moderate to large volume of abdominal ascites. There appears to be infiltration of the omentum with possible thickening of the peritoneal lining. There are possible calcified lymph nodes in the upper abdomen. Musculoskeletal: No chest wall abnormality.  No acute or significant osseous findings. Review of the MIP images confirms the above findings. IMPRESSION: 1. There is no large centrally located pulmonary embolism. Detection of smaller pulmonary emboli is limited by technique and contrast timing. 2. Large likely loculated left-sided pleural effusion. 3. Sequela of findings in the lungs as detailed above that favor an atypical infectious process such as advanced tuberculosis. Correlation with laboratory studies is recommended. Follow-up is recommended given the presence of pulmonary nodules as detailed above. 4. Moderate to large volume of abdominal ascites with infiltration of the omentum with possible thickening of the peritoneal lining. Findings raise concern for a process such as TB peritonitis in the appropriate clinical setting. Follow-up with a contrast-enhanced CT of the abdomen is recommended for further evaluation. 5. Trace pericardial effusion. These results were called by telephone at the time of interpretation on 01/24/2019 at 2:09 am to provider [facility] , who verbally acknowledged these results. Electronically Signed   By: Katherine Mantle M.D.   On: 01/24/2019 02:10   CT ABDOMEN PELVIS W CONTRAST  Result Date: 01/31/2019 CLINICAL DATA:  28 year old female with fever EXAM: CT ABDOMEN AND PELVIS WITH CONTRAST TECHNIQUE: Multidetector CT imaging of the abdomen and pelvis was performed using the standard protocol following bolus administration of intravenous contrast. CONTRAST:  OMNIPAQUE IOHEXOL 300 MG/ML  SOLN COMPARISON:  CT abdomen pelvis dated 01/24/2019. FINDINGS: Lower chest: Partially visualized large bilateral pleural effusions, increased in size since the prior CT. There is associated partial compressive atelectasis of the lower lobes although pneumonia is not excluded. Clinical correlation is recommended. There is a moderate size ascites. No pneumoperitoneum. Hepatobiliary: The liver is unremarkable. No intrahepatic biliary  ductal dilatation. The gallbladder is unremarkable. Calcific focus in the region of the porta pedis similar to prior CT possibly calcified lymph node. Pancreas: Unremarkable. No pancreatic ductal dilatation or surrounding inflammatory changes. Spleen: Normal in size without focal abnormality. Adrenals/Urinary Tract: The adrenal glands are unremarkable. There is no hydronephrosis on either side. The urinary bladder is grossly unremarkable. Stomach/Bowel: There is no bowel obstruction. The appendix is not identified with certainty. Vascular/Lymphatic: The abdominal aorta and IVC are unremarkable. No portal venous gas. Mildly enlarged right iliac chain lymph node measuring 11 mm in short axis (series 3, image 70). Reproductive: The uterus is grossly unremarkable. Lobulated appearing or septated structures within the pelvis again noted concerning for cystic adnexal masses. Additional tubular structures within the pelvis likely represent mildly dilated fallopian tubes. Other: They are is mildly thickened and nodular peritoneum with enhancement which may be related to peritoneal implants or peritonitis. There is extensive omental caking. There is diffuse subcutaneous edema. Musculoskeletal: No acute or significant osseous findings. IMPRESSION: 1. Partially visualized large bilateral pleural effusions, increased in size since the prior CT. 2. Moderate ascites, likely malignant. There is findings of peritoneal implants or peritonitis as well as extensive omental caking. 3. Probable cystic lesions of the adnexa. The overall findings are concerning for cystic ovarian neoplasm. Further evaluation with sampling of ascitic fluid and/or pelvic MRI without and with contrast recommended. 4. Mildly enlarged right iliac  chain lymph node. 5. No bowel obstruction. Electronically Signed   By: Elgie Collard M.D.   On: 01/31/2019 22:08   CT ABDOMEN PELVIS W CONTRAST  Result Date: 01/24/2019 CLINICAL DATA:  Peritonitis EXAM: CT  ABDOMEN AND PELVIS WITH CONTRAST TECHNIQUE: Multidetector CT imaging of the abdomen and pelvis was performed using the standard protocol following bolus administration of intravenous contrast. CONTRAST:  80mL OMNIPAQUE IOHEXOL 300 MG/ML  SOLN COMPARISON:  None. FINDINGS: Lower chest: Again noted is a large loculated left-sided pleural effusion with some pleural thickening at the lung base. Hepatobiliary: The liver is normal. Normal gallbladder.There is no biliary ductal dilation. Pancreas: Normal contours without ductal dilatation. No peripancreatic fluid collection. Spleen: No splenic laceration or hematoma. Adrenals/Urinary Tract: --Adrenal glands: No adrenal hemorrhage. --Right kidney/ureter: The appearance of the kidney is slightly striated which may be secondary to back to back contrast-enhanced studies. --Left kidney/ureter: The appearance of the kidney is striated which may be secondary to back to back contrast-enhanced studies. --Urinary bladder: Unremarkable. Stomach/Bowel: --Stomach/Duodenum: There is some wall thickening of the distal gastric body and gastric antrum. --Small bowel: There are slightly dilated loops of small bowel scattered throughout the abdomen. Many of the small bowel loops appear somewhat matted. --Colon: No focal abnormality. --Appendix: Surgically absent. Vascular/Lymphatic: Normal course and caliber of the major abdominal vessels. --there are no pathologically enlarged retroperitoneal lymph nodes. There are few mildly enlarged but subcentimeter lymph nodes noted in the retroperitoneal space. --there are multiple enlarged low-attenuation mesenteric lymph nodes. These are best appreciated on the coronal series. The largest measures approximately 1.4 cm. --there are enlarged pelvic lymph nodes. For example there is a 1.1 cm right pelvic sidewall lymph node (axial series 3, image 68). Reproductive: On the coronal series, there is a fluid-filled somewhat serpiginous structure in the  patient's left hemipelvis. There is amorphous soft tissue within the patient's pelvis. The ovaries are somewhat indistinct. The uterus is unremarkable. Other: There is a moderate to large volume of free fluid in the patient's abdomen. This free fluid measures above water density, however this measurement may not be reliable as the gallbladder also measures above water density. There is diffuse caking of the omentum. There is edema within the mesentery and thickening of the peritoneal lining. Musculoskeletal. There is no acute displaced fracture. No dislocation IMPRESSION: 1. Multiple findings as detailed above favor a diagnosis of TB peritonitis versus less likely peritoneal carcinomatosis given the findings in the lungs. The findings include a moderate to large volume of free fluid, omental caking, and enlarged likely caseating lymph nodes among other findings. Follow-up is recommended. 2. Moderate to large volume somewhat hyperdense free fluid in the abdomen. This is amenable to percutaneous sampling. 3. Possible wall thickening of the gastric antrum. Findings may be secondary to GI involvement of TB. 4. Serpiginous cystic appearing structure in the patient's left hemipelvis raises concern for left-sided hydrosalpinx or pyosalpinx. This can be further evaluated with a pelvic ultrasound. 5. Amorphous soft tissue in the patient's pelvis with indistinct ovaries. Attention on follow-up examinations is recommended and/or follow-up with pelvic ultrasound is recommended. 6. Again noted is a large likely loculated left-sided pleural effusion with pleural thickening at the left lung base. This is amenable to percutaneous sampling as clinically indicated. These results were called by telephone at the time of interpretation on 01/24/2019 at 3:32 am to provider [facility] , who verbally acknowledged these results. Electronically Signed   By: Katherine Mantle M.D.   On: 01/24/2019 03:33  DG CHEST PORT 1 VIEW  Result  Date: 01/29/2019 CLINICAL DATA:  Hypoxia, chest pain, thoracentesis 01/25/2019 EXAM: PORTABLE CHEST 1 VIEW COMPARISON:  Portable exam 1729 hours compared to 01/25/2019 and correlated with CT chest of 01/24/2019 FINDINGS: Normal heart size mediastinal contours. Small to moderate LEFT and tiny RIGHT pleural effusions. Bibasilar atelectasis greater on LEFT. Patchy infiltrates in the upper lobes again identified. Nodular foci in the lungs on the prior CT exam are not well demonstrated radiographically. No pneumothorax or acute osseous findings. IMPRESSION: Increased LEFT pleural effusion and new tiny RIGHT pleural effusion since 01/25/2019 with associated bibasilar atelectasis. Pulmonary nodules identified on prior CT much less well visualized radiographically. Electronically Signed   By: Ulyses Southward M.D.   On: 01/29/2019 17:50   IR ABDOMEN US LIMITED  Result Date: 01/30/2019 CLINICAL DATA:  28 year old female with possible extrapulmonary tuberculosis and a recent history of ascites. Evaluate for recurrent ascites. EXAM: LIMITED ABDOMEN ULTRASOUND FOR ASCITES TECHNIQUE: Limited ultrasound survey for ascites was performed in all four abdominal quadrants. COMPARISON:  Prior paracentesis 01/26/2019 FINDINGS: Ultrasound was used to interrogate the abdomen. There is trace recurrent ascites which is insufficient for paracentesis. IMPRESSION: Trace recurrent ascites, insufficient for paracentesis. Electronically Signed   By: Malachy Moan M.D.   On: 01/30/2019 10:20   IR US CHEST  Result Date: 01/30/2019 CLINICAL DATA:  28 year old female with possible recurrent left pleural effusion and ascites. EXAM: CHEST ULTRASOUND COMPARISON:  Left thoracentesis 01/24/2019 FINDINGS: Ultrasound was used to interrogate the left chest. There is no significant recurrent pleural effusion. IMPRESSION: Negative for significant recurrent left pleural effusion. Thoracentesis was not performed. Electronically Signed   By: Malachy Moan M.D.   On: 01/30/2019 08:14   US PELVIC COMPLETE WITH TRANSVAGINAL  Result Date: 01/29/2019 CLINICAL DATA:  Serpiginous cystic appearing structure in the left pelvis on a recent abdomen and pelvis CT, concerning for a possible hydrosalpinx or pyosalpinx. The patient also had amorphous soft tissue in the pelvis with indistinct ovaries on the CT. She also had omental caking and above water density free peritoneal fluid on the CT, suspicious for carcinomatosis or TB peritonitis. EXAM: TRANSABDOMINAL AND TRANSVAGINAL ULTRASOUND OF PELVIS TECHNIQUE: Both transabdominal and transvaginal ultrasound examinations of the pelvis were performed. Transabdominal technique was performed for global imaging of the pelvis including uterus, ovaries, adnexal regions, and pelvic cul-de-sac. It was necessary to proceed with endovaginal exam following the transabdominal exam to visualize the uterus and ovaries in better detail. COMPARISON:  Abdomen and pelvis CT dated 01/24/2019. FINDINGS: Uterus Measurements: 5.3 x 4.0 x 3.1 cm = volume: 34 mL. No fibroids or other mass visualized. Endometrium Thickness: 10.0 mm.  No focal abnormality visualized. Right ovary Measurements: 4.2 x 2.6 x 2.4 cm = volume: 13 mL. Normal appearance/no adnexal mass. Left ovary Measurements: 4.2 x 3.1 x 3.0 cm = volume: 20 mL. Normal appearance/no adnexal mass. Other findings Moderately large amount of free peritoneal fluid containing diffuse low-level internal echoes. There is also a multiloculated, near anechoic fluid collection in the left adnexal region extending superiorly into the free peritoneal fluid. This corresponds to the abnormality on the recent CT. IMPRESSION: 1. Multiloculated, near anechoic fluid collection in the left adnexal region, extending superiorly into a moderately large amount of complicated free peritoneal fluid. Differential considerations include a large hydrosalpinx, multiloculated fluid collection associated with  peritonitis or multiloculated fluid collection associated with carcinomatosis. 2. Moderately large amount of complicated free peritoneal fluid. This could represent infected, hemorrhagic or proteinaceous  fluid. 3. Normal appearing uterus and ovaries. Electronically Signed   By: Beckie SaltsSteven  Reid M.D.   On: 01/29/2019 20:48   IR Paracentesis  Result Date: 01/26/2019 INDICATION: Patient with history of dyspnea, probable TB, loculated pleural effusion s/p thoracentesis in IR 01/25/2019, abdominal distension, and ascites. Request is made for diagnostic and therapeutic paracentesis. EXAM: ULTRASOUND GUIDED DIAGNOSTIC AND THERAPEUTIC PARACENTESIS MEDICATIONS: 7 mL 1% lidocaine COMPLICATIONS: None immediate. PROCEDURE: Informed written consent was obtained from the patient after a discussion of the risks, benefits and alternatives to treatment. A timeout was performed prior to the initiation of the procedure. Initial ultrasound scanning demonstrates a moderate amount of ascites within the left lower abdominal quadrant. The left lower abdomen was prepped and draped in the usual sterile fashion. 1% lidocaine was used for local anesthesia. Following this, a 19 gauge, 7-cm, Yueh catheter was introduced. An ultrasound image was saved for documentation purposes. The paracentesis was performed. The catheter was removed and a dressing was applied. The patient tolerated the procedure well without immediate post procedural complication. FINDINGS: A total of approximately 1.3 L of clear gold fluid was removed. Samples were sent to the laboratory as requested by the clinical team. IMPRESSION: Successful ultrasound-guided paracentesis yielding 1.3 L of peritoneal fluid. Spanish language medical interpreter was used during today's procedure. Read by: Elwin MochaAlexandra Louk, PA-C Electronically Signed   By: Irish LackGlenn  Yamagata M.D.   On: 01/26/2019 15:44   IR THORACENTESIS ASP PLEURAL SPACE W/IMG GUIDE  Result Date: 01/25/2019 INDICATION: Cough  and shortness of breath. Left pleural effusion. Rule out tuberculosis. Request for diagnostic and therapeutic thoracentesis. EXAM: ULTRASOUND GUIDED LEFT THORACENTESIS MEDICATIONS: 1% lidocaine 8 mL COMPLICATIONS: None immediate. PROCEDURE: An ultrasound guided thoracentesis was thoroughly discussed with the patient and questions answered. The benefits, risks, alternatives and complications were also discussed. The patient understands and wishes to proceed with the procedure. Written consent was obtained. Ultrasound was performed to localize and mark an adequate pocket of fluid in the left chest. The area was then prepped and draped in the normal sterile fashion. 1% Lidocaine was used for local anesthesia. Under ultrasound guidance a 6 Fr Safe-T-Centesis catheter was introduced. Thoracentesis was performed. The catheter was removed and a dressing applied. FINDINGS: A total of approximately 800 mL of clear yellow fluid was removed. Samples were sent to the laboratory as requested by the clinical team. IMPRESSION: Successful ultrasound guided left thoracentesis yielding 800 mL of pleural fluid. Read by: Corrin ParkerWendy Blair, PA-C Electronically Signed   By: Irish LackGlenn  Yamagata M.D.   On: 01/25/2019 16:20    Subjective: No acute issues or events overnight, excited for discharge denies fevers, chills, nausea, vomiting, diarrhea, constipation, headache.  Discharge Exam: Vitals:   02/01/19 0500 02/01/19 0515  BP:  117/72  Pulse:  94  Resp: 20 20  Temp:  98.2 F (36.8 C)  SpO2:  94%   Vitals:   01/31/19 1438 01/31/19 1949 02/01/19 0500 02/01/19 0515  BP: 109/77 112/76  117/72  Pulse: 94   94  Resp: (!) 24 20 20 20   Temp: 98.7 F (37.1 C) 99.4 F (37.4 C)  98.2 F (36.8 C)  TempSrc: Oral Oral  Oral  SpO2: 93% 94%  94%  Weight:      Height:        General:  Pleasantly resting in bed, No acute distress. HEENT:  Normocephalic atraumatic.  Sclerae nonicteric, noninjected.  Extraocular movements intact  bilaterally. Neck:  Without mass or deformity.  Trachea is midline. Lungs:  Clear to auscultate bilaterally without rhonchi, wheeze, or rales. Heart:  Regular rate and rhythm.  Without murmurs, rubs, or gallops. Abdomen:  Soft, nontender, minimally distended.  Without guarding or rebound. Extremities: Without cyanosis, clubbing, edema, or obvious deformity. Vascular:  Dorsalis pedis and posterior tibial pulses palpable bilaterally. Skin:  Warm and dry, no erythema, no ulcerations.  The results of significant diagnostics from this hospitalization (including imaging, microbiology, ancillary and laboratory) are listed below for reference.    Microbiology: Recent Results (from the past 240 hour(s))  Blood culture (routine x 2)     Status: None   Collection Time: 01/24/19  4:38 AM   Specimen: BLOOD RIGHT HAND  Result Value Ref Range Status   Specimen Description BLOOD RIGHT HAND  Final   Special Requests   Final    BOTTLES DRAWN AEROBIC AND ANAEROBIC Blood Culture results may not be optimal due to an inadequate volume of blood received in culture bottles   Culture   Final    NO GROWTH 5 DAYS Performed at [facility], [place] [address], [place], [place] [address]    Report Status 01/29/2019 FINAL  Final  Blood culture (routine x 2)     Status: None   Collection Time: 01/24/19  4:38 AM   Specimen: BLOOD  Result Value Ref Range Status   Specimen Description BLOOD LEFT ANTECUBITAL  Final   Special Requests   Final    BOTTLES DRAWN AEROBIC AND ANAEROBIC Blood Culture adequate volume   Culture   Final    NO GROWTH 5 DAYS Performed at [facility], [place] [address], [place], [place] [address]    Report Status 01/29/2019 FINAL  Final  SARS CORONAVIRUS 2 (TAT 6-24 HRS) Nasopharyngeal Nasopharyngeal Swab     Status: None   Collection Time: 01/24/19  8:53 AM   Specimen: Nasopharyngeal Swab  Result Value Ref Range Status   SARS Coronavirus 2 NEGATIVE NEGATIVE Final    Comment:  (NOTE) SARS-CoV-2 target nucleic acids are NOT DETECTED. The SARS-CoV-2 RNA is generally detectable in upper and lower respiratory specimens during the acute phase of infection. Negative results do not preclude SARS-CoV-2 infection, do not rule out co-infections with other pathogens, and should not be used as the sole basis for treatment or other patient management decisions. Negative results must be combined with clinical observations, patient history, and epidemiological information. The expected result is Negative. Fact Sheet for Patients: SugarRoll.be Fact Sheet for Healthcare Providers: https://www.woods-mathews.com/ This test is not yet approved or cleared by the [place] FDA and  has been authorized for detection and/or diagnosis of SARS-CoV-2 by FDA under an Emergency Use Authorization (EUA). This EUA will remain  in effect (meaning this test can be used) for the duration of the COVID-19 declaration under Section 56 4(b)(1) of the Act, 21 U.S.C. section 360bbb-3(b)(1), unless the authorization is terminated or revoked sooner. Performed at [facility], [place] [address], [place], [place] [address]   Gram stain     Status: None   Collection Time: 01/25/19  1:53 PM   Specimen: Lung, Left; Pleural Fluid  Result Value Ref Range Status   Specimen Description PLEURAL LEFT  Final   Special Requests NONE  Final   Gram Stain   Final    ABUNDANT WBC PRESENT, PREDOMINANTLY MONONUCLEAR NO ORGANISMS SEEN Performed at [facility], [place] [address] [place], [place] [address]    Report Status 01/25/2019 FINAL  Final  Fungus Culture With  Stain     Status: None (Preliminary result)   Collection Time: 01/25/19  1:53 PM   Specimen: Lung, Left; Pleural Fluid  Result Value Ref Range Status   Fungus Stain Final report  Final    Comment: (NOTE) Performed At: [facility] [address] [place], [place] [id number] Jolene Schimke  MD [id number]:[id number]    Fungus (Mycology) Culture PENDING  Incomplete   Fungal Source PLEURAL  Final    Comment: LEFT Performed at [facility], [address] [place], [place] [address]   Acid Fast Smear (AFB)     Status: None   Collection Time: 01/25/19  1:53 PM   Specimen: Lung, Left; Pleural Fluid  Result Value Ref Range Status   AFB Specimen Processing Concentration  Final   Acid Fast Smear Negative  Final    Comment: (NOTE) Performed At: [facility] [address] [place], [place] [id number] Jolene Schimke MD [id number]    Source (AFB) PLEURAL  Final    Comment: LEFT Performed at [facility], [address] [place], [place] [address]   Culture, body fluid-bottle     Status: None   Collection Time: 01/25/19  1:53 PM   Specimen: Pleura  Result Value Ref Range Status   Specimen Description PLEURAL LEFT  Final   Special Requests NONE  Final   Culture   Final    NO GROWTH 5 DAYS Performed at [facility], [address], [place], [place] [address]    Report Status 01/30/2019 FINAL  Final  Fungus Culture Result     Status: None   Collection Time: 01/25/19  1:53 PM  Result Value Ref Range Status   Result 1 Comment  Final    Comment: (NOTE) KOH/Calcofluor preparation:  no fungus observed. Performed At: [facility] [address] [place], [place] [id number] Jolene Schimke MD VZ:[id number]   Gram stain     Status: None   Collection Time: 01/26/19  3:41 PM   Specimen: Abdomen; Peritoneal Fluid  Result Value Ref Range Status   Specimen Description FLUID PERITONEAL  Final   Special Requests NONE  Final   Gram Stain   Final    NO WBC SEEN NO ORGANISMS SEEN Performed at [facility], [address] [place], [place] [address]    Report Status 01/26/2019 FINAL  Final  Fungus Culture With Stain     Status: None (Preliminary result)   Collection Time: 01/26/19  3:41 PM   Specimen: Abdomen; Peritoneal Fluid  Result Value Ref  Range Status   Fungus Stain Final report  Final    Comment: (NOTE) Performed At: [facility] [address] [place], [place] [id number] Jolene Schimke MD ZS:[id number]    Fungus (Mycology) Culture PENDING  Incomplete   Fungal Source FLUID  Final    Comment: PERITONEAL Performed at [facility], [address], [place], [place] [address]   Acid Fast Smear (AFB)     Status: None   Collection Time: 01/26/19  3:41 PM   Specimen: Abdomen; Peritoneal Fluid  Result Value Ref Range Status   AFB Specimen Processing Concentration  Final   Acid Fast Smear Negative  Final    Comment: (NOTE) Performed At: [facility] [address] [place], [place] [id number] Jolene Schimke MD SE:[id number]    Source (AFB) FLUID  Final    Comment: PERITONEAL Performed at [facility], [address] [place], [place] [address]   Culture,  body fluid-bottle     Status: None   Collection Time: 01/26/19  3:41 PM   Specimen: Fluid  Result Value Ref Range Status   Specimen Description FLUID PERITONEAL  Final   Special Requests BOTTLES DRAWN AEROBIC AND ANAEROBIC 10CC  Final   Culture   Final    NO GROWTH 5 DAYS Performed at [facility], [address], [place], [place] [address]    Report Status 01/31/2019 FINAL  Final  Fungus Culture Result     Status: None   Collection Time: 01/26/19  3:41 PM  Result Value Ref Range Status   Result 1 Comment  Final    Comment: (NOTE) KOH/Calcofluor preparation:  no fungus observed. Performed At: [facility] [address] [place], [place] [id number] Jolene Schimke MD NU:[id number]   Culture, blood (routine x 2)     Status: None   Collection Time: 01/27/19  4:45 PM   Specimen: BLOOD  Result Value Ref Range Status   Specimen Description BLOOD LEFT ANTECUBITAL  Final   Special Requests   Final    BOTTLES DRAWN AEROBIC AND ANAEROBIC Blood Culture adequate volume   Culture   Final    NO GROWTH 5 DAYS Performed at [facility], [address] [place], [place] [address]    Report Status 02/01/2019 FINAL  Final  Culture, blood (routine x 2)     Status: None   Collection Time: 01/27/19  4:53 PM   Specimen: BLOOD LEFT HAND  Result Value Ref Range Status   Specimen Description BLOOD LEFT HAND  Final   Special Requests   Final    BOTTLES DRAWN AEROBIC AND ANAEROBIC Blood Culture adequate volume   Culture   Final    NO GROWTH 5 DAYS Performed at [facility], [address] [place], [place] [address]    Report Status 02/01/2019 FINAL  Final     Labs: BNP (last 3 results) No results for input(s): BNP in the last 8760 hours. Basic Metabolic Panel: Recent Labs  Lab 01/26/19 0714 01/28/19 0325 01/29/19 0721 01/30/19 0309 01/31/19 0311  NA 136 139 138 140 136  K 4.3 3.5 3.4* 3.9 4.3  CL 106 108 112* 108 107  CO2 21* 19* 20* 21* 21*  GLUCOSE 90 90 95 87 82  BUN <5* <5* 5* <5* 7  CREATININE 0.61 0.55 0.55 0.62 0.78  CALCIUM 8.3* 8.7* 8.4* 8.8* 8.6*   Liver Function Tests: Recent Labs  Lab 01/25/19 1307 01/28/19 0325 01/29/19 0721 01/31/19 0311  AST ALT ALKPHOS 41 33* 33* 40  BILITOT 0.3 0.4 0.5 0.8  PROT 7.0 7.1 6.1* 6.4*  ALBUMIN 2.7* 4.6 3.4* 3.1*   No results for input(s): LIPASE, AMYLASE in the last 168 hours. No results for input(s): AMMONIA in the last 168 hours. CBC: Recent Labs  Lab 01/25/19 1307 01/26/19 0714 01/28/19 0325 01/28/19 0729 01/29/19 0554 01/30/19 0309 01/31/19 0311 02/01/19 0324  WBC 7.6 6.3 6.3  --  6.7 7.8 7.8 7.6  NEUTROABS 6.1 4.2  --   --  4.9  --   --   --   HGB 11.1* 10.8* 8.1* 9.2* 9.6* 10.1* 9.5* 9.4*  HCT 34.1* 32.7* 24.6* 27.5* 29.4* 30.4* 28.6* 28.3*  MCV 82.8 81.5 82.0  --  82.6 80.0 80.6 79.9*  PLT 694* 644* 530*  --  556* 627* 638* 607*   Urinalysis    Component  Value Date/Time   COLORURINE YELLOW 01/24/2019 0038   APPEARANCEUR HAZY (A) 01/24/2019 0038   LABSPEC 1.020 01/24/2019 0038   PHURINE  5.0 01/24/2019 0038   GLUCOSEU NEGATIVE 01/24/2019 0038   HGBUR SMALL (A) 01/24/2019 0038   BILIRUBINUR NEGATIVE 01/24/2019 0038   KETONESUR 5 (A) 01/24/2019 0038   PROTEINUR NEGATIVE 01/24/2019 0038   NITRITE NEGATIVE 01/24/2019 0038   LEUKOCYTESUR NEGATIVE 01/24/2019 0038   Microbiology Recent Results (from the past 240 hour(s))  Blood culture (routine x 2)     Status: None   Collection Time: 01/24/19  4:38 AM   Specimen: BLOOD RIGHT HAND  Result Value Ref Range Status   Specimen Description BLOOD RIGHT HAND  Final   Special Requests   Final    BOTTLES DRAWN AEROBIC AND ANAEROBIC Blood Culture results may not be optimal due to an inadequate volume of blood received in culture bottles   Culture   Final    NO GROWTH 5 DAYS Performed at [facility], [address] [place], [place] [address]    Report Status 01/29/2019 FINAL  Final  Blood culture (routine x 2)     Status: None   Collection Time: 01/24/19  4:38 AM   Specimen: BLOOD  Result Value Ref Range Status   Specimen Description BLOOD LEFT ANTECUBITAL  Final   Special Requests   Final    BOTTLES DRAWN AEROBIC AND ANAEROBIC Blood Culture adequate volume   Culture   Final    NO GROWTH 5 DAYS Performed at [facility], [address] [place], [place] [address]    Report Status 01/29/2019 FINAL  Final  SARS CORONAVIRUS 2 (TAT 6-24 HRS) Nasopharyngeal Nasopharyngeal Swab     Status: None   Collection Time: 01/24/19  8:53 AM   Specimen: Nasopharyngeal Swab  Result Value Ref Range Status   SARS Coronavirus 2 NEGATIVE NEGATIVE Final    Comment: (NOTE) SARS-CoV-2 target nucleic acids are NOT DETECTED. The SARS-CoV-2 RNA is generally detectable in upper and lower respiratory specimens during the acute phase of infection. Negative results do not preclude SARS-CoV-2 infection, do not rule out co-infections with other pathogens, and should not be used as the sole basis for treatment or other patient management  decisions. Negative results must be combined with clinical observations, patient history, and epidemiological information. The expected result is Negative. Fact Sheet for Patients: HairSlick.no Fact Sheet for Healthcare Providers: quierodirigir.com This test is not yet approved or cleared by the [place] FDA and  has been authorized for detection and/or diagnosis of SARS-CoV-2 by FDA under an Emergency Use Authorization (EUA). This EUA will remain  in effect (meaning this test can be used) for the duration of the COVID-19 declaration under Section 56 4(b)(1) of the Act, 21 U.S.C. section 360bbb-3(b)(1), unless the authorization is terminated or revoked sooner. Performed at [facility], [address], [place], [place] [address]   Gram stain     Status: None   Collection Time: 01/25/19  1:53 PM   Specimen: Lung, Left; Pleural Fluid  Result Value Ref Range Status   Specimen Description PLEURAL LEFT  Final   Special Requests NONE  Final   Gram Stain   Final    ABUNDANT WBC PRESENT, PREDOMINANTLY MONONUCLEAR NO ORGANISMS SEEN Performed at [facility], [address], [place], [place] [address]    Report Status 01/25/2019 FINAL  Final  Fungus Culture With Stain     Status: None (Preliminary  result)   Collection Time: 01/25/19  1:53 PM   Specimen: Lung, Left; Pleural Fluid  Result Value Ref Range Status   Fungus Stain Final report  Final    Comment: (NOTE) Performed At: [facility] [address] [place], [place] [id number] Jolene Schimke MD JX:[id number]    Fungus (Mycology) Culture PENDING  Incomplete   Fungal Source PLEURAL  Final    Comment: LEFT Performed at [facility], [address], [place], [place] [address]   Acid Fast Smear (AFB)     Status: None   Collection Time: 01/25/19  1:53 PM   Specimen: Lung, Left; Pleural Fluid  Result Value Ref Range Status   AFB Specimen Processing  Concentration  Final   Acid Fast Smear Negative  Final    Comment: (NOTE) Performed At: [facility] [address] [place], [place] [id number] Jolene Schimke MD [id number]    Source (AFB) PLEURAL  Final    Comment: LEFT Performed at [facility], [address], [place], [place] [address]   Culture, body fluid-bottle     Status: None   Collection Time: 01/25/19  1:53 PM   Specimen: Pleura  Result Value Ref Range Status   Specimen Description PLEURAL LEFT  Final   Special Requests NONE  Final   Culture   Final    NO GROWTH 5 DAYS Performed at [facility], [address], [place], [place] [address]    Report Status 01/30/2019 FINAL  Final  Fungus Culture Result     Status: None   Collection Time: 01/25/19  1:53 PM  Result Value Ref Range Status   Result 1 Comment  Final    Comment: (NOTE) KOH/Calcofluor preparation:  no fungus observed. Performed At: [facility] [address] [place], [place] [id number] Jolene Schimke MD [id number]   Gram stain     Status: None   Collection Time: 01/26/19  3:41 PM   Specimen: Abdomen; Peritoneal Fluid  Result Value Ref Range Status   Specimen Description FLUID PERITONEAL  Final   Special Requests NONE  Final   Gram Stain   Final    NO WBC SEEN NO ORGANISMS SEEN Performed at [facility], [address] [place], [place] [address]    Report Status 01/26/2019 FINAL  Final  Fungus Culture With Stain     Status: None (Preliminary result)   Collection Time: 01/26/19  3:41 PM   Specimen: Abdomen; Peritoneal Fluid  Result Value Ref Range Status   Fungus Stain Final report  Final    Comment: (NOTE) Performed At: [facility] [address] [place], [place] [id number] Jolene Schimke MD [id number]:[id number]    Fungus (Mycology) Culture PENDING  Incomplete   Fungal Source FLUID  Final    Comment: PERITONEAL Performed at [facility], [address] [place], [place] [address]   Acid Fast  Smear (AFB)     Status: None   Collection Time: 01/26/19  3:41 PM   Specimen: Abdomen; Peritoneal Fluid  Result Value Ref Range Status   AFB Specimen Processing Concentration  Final   Acid Fast Smear Negative  Final    Comment: (NOTE) Performed At: [facility] [address] [place], [place] [id number] Jolene Schimke MD [id number]    Source (AFB) FLUID  Final    Comment: PERITONEAL Performed at [facility], [address], [place], [place] [address]   Culture, body fluid-bottle     Status: None  Collection Time: 01/26/19  3:41 PM   Specimen: Fluid  Result Value Ref Range Status   Specimen Description FLUID PERITONEAL  Final   Special Requests BOTTLES DRAWN AEROBIC AND ANAEROBIC 10CC  Final   Culture   Final    NO GROWTH 5 DAYS Performed at [facility], [address], [place], [place] [address]    Report Status 01/31/2019 FINAL  Final  Fungus Culture Result     Status: None   Collection Time: 01/26/19  3:41 PM  Result Value Ref Range Status   Result 1 Comment  Final    Comment: (NOTE) KOH/Calcofluor preparation:  no fungus observed. Performed At: [facility] [address] [place], [place] [id number] Jolene Schimke MD XB:[id number]   Culture, blood (routine x 2)     Status: None   Collection Time: 01/27/19  4:45 PM   Specimen: BLOOD  Result Value Ref Range Status   Specimen Description BLOOD LEFT ANTECUBITAL  Final   Special Requests   Final    BOTTLES DRAWN AEROBIC AND ANAEROBIC Blood Culture adequate volume   Culture   Final    NO GROWTH 5 DAYS Performed at [facility], [address] [place], [place] [address]    Report Status 02/01/2019 FINAL  Final  Culture, blood (routine x 2)     Status: None   Collection Time: 01/27/19  4:53 PM   Specimen: BLOOD LEFT HAND  Result Value Ref Range Status   Specimen Description BLOOD LEFT HAND  Final   Special Requests   Final    BOTTLES DRAWN AEROBIC AND ANAEROBIC Blood Culture  adequate volume   Culture   Final    NO GROWTH 5 DAYS Performed at [facility], [address] [place], [place] [address]    Report Status 02/01/2019 FINAL  Final   Time coordinating discharge: Over 30 minutes  SIGNED:  Azucena Fallen, DO [facility] 02/01/2019, 8:03 AM Pager   If 7PM-7AM, please contact night-coverage [email or web address] Password [email or web address]

## 2019-02-01 DIAGNOSIS — Z9889 Other specified postprocedural states: Secondary | ICD-10-CM

## 2019-02-01 LAB — CBC
HCT: 28.3 % — ABNORMAL LOW (ref 36.0–46.0)
Hemoglobin: 9.4 g/dL — ABNORMAL LOW (ref 12.0–15.0)
MCH: 26.6 pg (ref 26.0–34.0)
MCHC: 33.2 g/dL (ref 30.0–36.0)
MCV: 79.9 fL — ABNORMAL LOW (ref 80.0–100.0)
Platelets: 607 10*3/uL — ABNORMAL HIGH (ref 150–400)
RBC: 3.54 MIL/uL — ABNORMAL LOW (ref 3.87–5.11)
RDW: 14.5 % (ref 11.5–15.5)
WBC: 7.6 10*3/uL (ref 4.0–10.5)
nRBC: 0 % (ref 0.0–0.2)

## 2019-02-01 LAB — CULTURE, BLOOD (ROUTINE X 2)
Culture: NO GROWTH
Culture: NO GROWTH
Special Requests: ADEQUATE
Special Requests: ADEQUATE

## 2019-02-01 MED ORDER — DM-GUAIFENESIN ER 30-600 MG PO TB12: 2.0000 | ORAL_TABLET | Freq: Two times a day (BID) | ORAL | 0 refills | Status: DC

## 2019-02-01 MED FILL — MUCUS RELIEF DM 30-600 MG T: 30-600 | 8 days supply | Qty: 30 | Fill #0

## 2019-02-01 NOTE — TOC Transition Note (Addendum)
Transition of Care [facility]) - CM/SW Discharge Note Marvetta Gibbons RN, BSN Transitions of Care Unit 4E- RN Case Manager [phone]   Patient Details  Name: Nancy Hurst MRN: [id number] Date of Birth: Sep 16, 1990  Transition of Care [facility]) CM/SW Contact:  Dawayne Patricia, RN Phone Number: 02/01/2019, 11:43 AM   Clinical Narrative:    Pt stable for transition home today, [facility] pharmacy has provided meds for discharge with medication assistance provided through [facility]. Pt does not have PCP- however clinics closed today and unable to make appointment- info placed on AVS for pt to call on Monday after the holidays to ask for f/u appointment post hospital discharge.    Final next level of care: Home/Self Care Barriers to Discharge: No Barriers Identified   Patient Goals and CMS Choice  NA      Discharge Placement            Home                                               Social Determinants of Health (SDOH) Interventions     Readmission Risk Interventions Readmission Risk Prevention Plan 02/01/2019  Transportation Screening Complete  PCP or Specialist Appt within 5-7 Days Not Complete  Not Complete comments unable to make appointment due to holiday- info provided for pt to call  Home Care Screening Complete  Medication Review (RN CM) Complete

## 2019-02-01 NOTE — Progress Notes (Signed)
Subjective: No new complaints.   Antibiotics:  Anti-infectives (From admission, onward)   Start     Dose/Rate Route Frequency Ordered Stop   02/01/19 0000  isoniazid (NYDRAZID) 300 MG tablet     300 mg Oral Daily 01/31/19 1445 02/07/19 2359   02/01/19 0000  ethambutol (MYAMBUTOL) 400 MG tablet     800 mg Oral Daily 01/31/19 1445 02/07/19 2359   02/01/19 0000  pyrazinamide 500 MG tablet     1,000 mg Oral Daily 01/31/19 1445 02/07/19 2359   02/01/19 0000  rifampin (RIFADIN) 300 MG capsule     600 mg Oral Daily 01/31/19 1445 02/07/19 2359   01/30/19 2200  cefoTEtan (CEFOTAN) 2 g in sodium chloride 0.9 % 100 mL IVPB  Status:  Discontinued     2 g 200 mL/hr over 30 Minutes Intravenous Every 12 hours 01/30/19 1703 01/31/19 1843   01/30/19 1800  doxycycline (VIBRAMYCIN) 100 mg in sodium chloride 0.9 % 250 mL IVPB  Status:  Discontinued     100 mg 125 mL/hr over 120 Minutes Intravenous Every 12 hours 01/30/19 1703 01/31/19 1525   01/30/19 1100  rifampin (RIFADIN) capsule 600 mg     600 mg Oral Daily 01/30/19 1020     01/30/19 1000  isoniazid (NYDRAZID) tablet 300 mg     300 mg Oral Daily 01/30/19 0933     01/30/19 1000  rifampin (RIFADIN) capsule 450 mg  Status:  Discontinued     10 mg/kg/day  48 kg Oral Daily 01/30/19 0933 01/30/19 1020   01/30/19 1000  pyrazinamide tablet 1,000 mg     1,000 mg Oral Daily 01/30/19 0933     01/30/19 1000  ethambutol (MYAMBUTOL) tablet 800 mg     800 mg Oral Daily 01/30/19 0933        Medications: Scheduled Meds: . dextromethorphan-guaiFENesin  2 tablet Oral BID  . enoxaparin (LOVENOX) injection  40 mg Subcutaneous Daily  . ethambutol  800 mg Oral Daily  . isoniazid  300 mg Oral Daily  . polyethylene glycol  17 g Oral Daily  . Ensure Max Protein  11 oz Oral Daily  . pyrazinamide  1,000 mg Oral Daily  . vitamin B-6  50 mg Oral Daily  . rifampin  600 mg Oral Daily  . senna-docusate  2 tablet Oral BID   Continuous Infusions:  PRN  Meds:.acetaminophen **OR** acetaminophen, HYDROcodone-acetaminophen, lidocaine (PF), lidocaine, ondansetron **OR** ondansetron (ZOFRAN) IV    Objective: Weight change:   Intake/Output Summary (Last 24 hours) at 02/01/2019 1106 Last data filed at 02/01/2019 0659 Gross per 24 hour  Intake 1360 ml  Output --  Net 1360 ml   Blood pressure 101/66, pulse 93, temperature 98.4 F (36.9 C), temperature source Oral, resp. rate 20, height  (1.6 m), weight 48 kg, last menstrual period 01/09/2019, SpO2 94 %. Temp:  [98.2 F (36.8 C)-99.4 F (37.4 C)] 98.4 F (36.9 C) (12/24 0855) Pulse Rate:  [93-94] 93 (12/24 0855) Resp:  [20-24] 20 (12/24 0855) BP: (101-117)/(66-77) 101/66 (12/24 0855) SpO2:  [93 %-94 %] 94 % (12/24 0855)  Physical Exam: General: Alert and awake, oriented x3, not in any acute distress. HEENT: anicteric sclera, EOMI CVS regular rate, normal  Chest: , no wheezing, no respiratory distress Abdomen: soft non-distended,  Extremities: no edema or deformity noted bilaterally Skin: no rashes Neuro: nonfocal  CBC:    BMET Recent Labs    01/30/19 0309 01/31/19 0311  NA  140 136  K 3.9 4.3  CL 108 107  CO2 21* 21*  GLUCOSE 87 82  BUN <5* 7  CREATININE 0.62 0.78  CALCIUM 8.8* 8.6*     Liver Panel  Recent Labs    01/31/19 0311  PROT 6.4*  ALBUMIN 3.1*  AST 16  ALT 13  ALKPHOS 40  BILITOT 0.8       Sedimentation Rate No results for input(s): ESRSEDRATE in the last 72 hours. C-Reactive Protein No results for input(s): CRP in the last 72 hours.  Micro Results: Recent Results (from the past 720 hour(s))  Blood culture (routine x 2)     Status: None   Collection Time: 01/24/19  4:38 AM   Specimen: BLOOD RIGHT HAND  Result Value Ref Range Status   Specimen Description BLOOD RIGHT HAND  Final   Special Requests   Final    BOTTLES DRAWN AEROBIC AND ANAEROBIC Blood Culture results may not be optimal due to an inadequate volume of blood received  in culture bottles   Culture   Final    NO GROWTH 5 DAYS Performed at [facility], [address] [place], [place] [address]    Report Status 01/29/2019 FINAL  Final  Blood culture (routine x 2)     Status: None   Collection Time: 01/24/19  4:38 AM   Specimen: BLOOD  Result Value Ref Range Status   Specimen Description BLOOD LEFT ANTECUBITAL  Final   Special Requests   Final    BOTTLES DRAWN AEROBIC AND ANAEROBIC Blood Culture adequate volume   Culture   Final    NO GROWTH 5 DAYS Performed at [facility], [address] [place], [place] [address]    Report Status 01/29/2019 FINAL  Final  SARS CORONAVIRUS 2 (TAT 6-24 HRS) Nasopharyngeal Nasopharyngeal Swab     Status: None   Collection Time: 01/24/19  8:53 AM   Specimen: Nasopharyngeal Swab  Result Value Ref Range Status   SARS Coronavirus 2 NEGATIVE NEGATIVE Final    Comment: (NOTE) SARS-CoV-2 target nucleic acids are NOT DETECTED. The SARS-CoV-2 RNA is generally detectable in upper and lower respiratory specimens during the acute phase of infection. Negative results do not preclude SARS-CoV-2 infection, do not rule out co-infections with other pathogens, and should not be used as the sole basis for treatment or other patient management decisions. Negative results must be combined with clinical observations, patient history, and epidemiological information. The expected result is Negative. Fact Sheet for Patients: HairSlick.nohttps://www.fda.gov/media/138098/download Fact Sheet for Healthcare Providers: quierodirigir.comhttps://www.fda.gov/media/138095/download This test is not yet approved or cleared by the [place] FDA and  has been authorized for detection and/or diagnosis of SARS-CoV-2 by FDA under an Emergency Use Authorization (EUA). This EUA will remain  in effect (meaning this test can be used) for the duration of the COVID-19 declaration under Section 56 4(b)(1) of the Act, 21 U.S.C. section 360bbb-3(b)(1), unless the  authorization is terminated or revoked sooner. Performed at [facility], [address] [place], [place] [address]   Gram stain     Status: None   Collection Time: 01/25/19  1:53 PM   Specimen: Lung, Left; Pleural Fluid  Result Value Ref Range Status   Specimen Description PLEURAL LEFT  Final   Special Requests NONE  Final   Gram Stain   Final    ABUNDANT WBC PRESENT, PREDOMINANTLY MONONUCLEAR NO ORGANISMS SEEN Performed at [facility], [address] [place], [place] [address]  Report Status 01/25/2019 FINAL  Final  Fungus Culture With Stain     Status: None (Preliminary result)   Collection Time: 01/25/19  1:53 PM   Specimen: Lung, Left; Pleural Fluid  Result Value Ref Range Status   Fungus Stain Final report  Final    Comment: (NOTE) Performed At: [facility] [place], [place] [id number] Rush Farmer MD [id number]    Fungus (Mycology) Culture PENDING  Incomplete   Fungal Source PLEURAL  Final    Comment: LEFT Performed at [facility], [place] [address] [place], [place] [address]   Acid Fast Smear (AFB)     Status: None   Collection Time: 01/25/19  1:53 PM   Specimen: Lung, Left; Pleural Fluid  Result Value Ref Range Status   AFB Specimen Processing Concentration  Final   Acid Fast Smear Negative  Final    Comment: (NOTE) Performed At: [facility] [place], [place] [id number] Rush Farmer MD [id number]    Source (AFB) PLEURAL  Final    Comment: LEFT Performed at [facility], [place] [address], [place], [place] [address]   Culture, body fluid-bottle     Status: None   Collection Time: 01/25/19  1:53 PM   Specimen: Pleura  Result Value Ref Range Status   Specimen Description PLEURAL LEFT  Final   Special Requests NONE  Final   Culture   Final    NO GROWTH 5 DAYS Performed at [facility], [address], [place], [place] [address]    Report Status 01/30/2019 FINAL  Final    Fungus Culture Result     Status: None   Collection Time: 01/25/19  1:53 PM  Result Value Ref Range Status   Result 1 Comment  Final    Comment: (NOTE) KOH/Calcofluor preparation:  no fungus observed. Performed At: [facility] [place], [place] [id number] Rush Farmer MD [id number]   Gram stain     Status: None   Collection Time: 01/26/19  3:41 PM   Specimen: Abdomen; Peritoneal Fluid  Result Value Ref Range Status   Specimen Description FLUID PERITONEAL  Final   Special Requests NONE  Final   Gram Stain   Final    NO WBC SEEN NO ORGANISMS SEEN Performed at [facility], [address], [place], [place] [address]    Report Status 01/26/2019 FINAL  Final  Fungus Culture With Stain     Status: None (Preliminary result)   Collection Time: 01/26/19  3:41 PM   Specimen: Abdomen; Peritoneal Fluid  Result Value Ref Range Status   Fungus Stain Final report  Final    Comment: (NOTE) Performed At: [facility] [place], [place] [id number] Rush Farmer MD [id number]    Fungus (Mycology) Culture PENDING  Incomplete   Fungal Source FLUID  Final    Comment: PERITONEAL Performed at [facility], [place] [address], [place], [place] [address]   Acid Fast Smear (AFB)     Status: None   Collection Time: 01/26/19  3:41 PM   Specimen: Abdomen; Peritoneal Fluid  Result Value Ref Range Status   AFB Specimen Processing Concentration  Final   Acid Fast Smear Negative  Final    Comment: (NOTE) Performed At: [facility] [place], [place] [id number] Rush Farmer MD [id number]    Source (AFB) FLUID  Final    Comment: PERITONEAL Performed at [facility],  [address] [place], [place] [address]   Culture, body fluid-bottle     Status: None   Collection Time: 01/26/19  3:41 PM   Specimen: Fluid  Result Value Ref Range Status   Specimen Description FLUID PERITONEAL  Final   Special Requests  BOTTLES DRAWN AEROBIC AND ANAEROBIC 10CC  Final   Culture   Final    NO GROWTH 5 DAYS Performed at [facility], [address], [place], [place] [address]    Report Status 01/31/2019 FINAL  Final  Fungus Culture Result     Status: None   Collection Time: 01/26/19  3:41 PM  Result Value Ref Range Status   Result 1 Comment  Final    Comment: (NOTE) KOH/Calcofluor preparation:  no fungus observed. Performed At: [facility] [address] [place], [place] [id number] Jolene Schimke MD WG:[id number]   Culture, blood (routine x 2)     Status: None   Collection Time: 01/27/19  4:45 PM   Specimen: BLOOD  Result Value Ref Range Status   Specimen Description BLOOD LEFT ANTECUBITAL  Final   Special Requests   Final    BOTTLES DRAWN AEROBIC AND ANAEROBIC Blood Culture adequate volume   Culture   Final    NO GROWTH 5 DAYS Performed at [facility], [address] [place], [place] [address]    Report Status 02/01/2019 FINAL  Final  Culture, blood (routine x 2)     Status: None   Collection Time: 01/27/19  4:53 PM   Specimen: BLOOD LEFT HAND  Result Value Ref Range Status   Specimen Description BLOOD LEFT HAND  Final   Special Requests   Final    BOTTLES DRAWN AEROBIC AND ANAEROBIC Blood Culture adequate volume   Culture   Final    NO GROWTH 5 DAYS Performed at [facility], [address], [place], [place] [address]    Report Status 02/01/2019 FINAL  Final    Studies/Results: CT ABDOMEN PELVIS W CONTRAST  Result Date: 01/31/2019 CLINICAL DATA:  28 year old female with fever EXAM: CT ABDOMEN AND PELVIS WITH CONTRAST TECHNIQUE: Multidetector CT imaging of the abdomen and pelvis was performed using the standard protocol following bolus administration of intravenous contrast. CONTRAST:  OMNIPAQUE IOHEXOL 300 MG/ML  SOLN COMPARISON:  CT abdomen pelvis dated 01/24/2019. FINDINGS: Lower chest: Partially visualized large bilateral pleural effusions, increased  in size since the prior CT. There is associated partial compressive atelectasis of the lower lobes although pneumonia is not excluded. Clinical correlation is recommended. There is a moderate size ascites. No pneumoperitoneum. Hepatobiliary: The liver is unremarkable. No intrahepatic biliary ductal dilatation. The gallbladder is unremarkable. Calcific focus in the region of the porta pedis similar to prior CT possibly calcified lymph node. Pancreas: Unremarkable. No pancreatic ductal dilatation or surrounding inflammatory changes. Spleen: Normal in size without focal abnormality. Adrenals/Urinary Tract: The adrenal glands are unremarkable. There is no hydronephrosis on either side. The urinary bladder is grossly unremarkable. Stomach/Bowel: There is no bowel obstruction. The appendix is not identified with certainty. Vascular/Lymphatic: The abdominal aorta and IVC are unremarkable. No portal venous gas. Mildly enlarged right iliac chain lymph node measuring 11 mm in short axis (series 3, image 70). Reproductive: The uterus is grossly unremarkable. Lobulated appearing or septated structures within the pelvis again noted concerning for cystic adnexal masses. Additional tubular structures within the pelvis likely represent mildly dilated fallopian tubes. Other: They are is mildly thickened and nodular peritoneum with enhancement  which may be related to peritoneal implants or peritonitis. There is extensive omental caking. There is diffuse subcutaneous edema. Musculoskeletal: No acute or significant osseous findings. IMPRESSION: 1. Partially visualized large bilateral pleural effusions, increased in size since the prior CT. 2. Moderate ascites, likely malignant. There is findings of peritoneal implants or peritonitis as well as extensive omental caking. 3. Probable cystic lesions of the adnexa. The overall findings are concerning for cystic ovarian neoplasm. Further evaluation with sampling of ascitic fluid and/or  pelvic MRI without and with contrast recommended. 4. Mildly enlarged right iliac chain lymph node. 5. No bowel obstruction. Electronically Signed   By: Elgie Collard M.D.   On: 01/31/2019 22:08      Assessment/Plan:  INTERVAL HISTORY:  Repeat CT scan shows reaccumulation of pleural and peritoneal fluid radiologist this time who read the films as concern for ovarian neoplasm.   Principal Problem:   Disseminated tuberculosis Active Problems:   Recurrent UTI   Loculated pleural effusion   Abdominal ascites   Normocytic anemia   Moderate protein-calorie malnutrition (HCC)   Fever   Biological false positive RPR test   Observation for suspected tuberculosis (TB)    [name] Tawni Millers is a 28 y.o. female with high fevers lymphocytic exudative pleural effusion and peritonitis, multiple findings in the abdomen concerning for extrapulmonary TB including including possible gastric involvement multiple lymph nodes enlarged and soft tissue and adnexal pathology.  This latter item was also further evaluate a pelvic ultrasound which had shown a loculated fluid collection.  OB/GYN think this could be TB versus bacterial pyosalpinx, but they are not wanting to intervene to get tissue to help [place] understand if it is 1 versus the other.  Similarly IR cannot get at the area but have offered omental sampling.  We repeated the CT scan which shows reaccumulation of pleural and peritoneal fluid.   Unless someone is going to actually do a biopsy I would continue to proceed with treatment for TB but 1 could send certainly labs for cancer markers.  I would recommend touching base with OB/GYN and the recent read by radiology.  Otherwise my plan is to see her in follow-up in the clinic and for her to get 4 drugs for extrapulmonary TB  [name] Tawni Millers has an appointment on February 21, 2019 at 9 AM  [facility] is located in the [facility]  at  [address] in [place].  [address], which is located to the left of the elevators.  Phone: [phone]  Fax: [phone]  [email or web address]  She should arrive 15 minutes prior to her appointment   I will sign off for now please call with further questions.   LOS: 8 days   Acey Lav 02/01/2019, 11:06 AM

## 2019-02-06 ENCOUNTER — Telehealth: Payer: Self-pay | Admitting: Infectious Disease

## 2019-02-06 NOTE — Telephone Encounter (Signed)
Received call from [facility] that patient stopped her anti TB meds due to side effects and refused to start again,  I called pt using the telephonic translation system and tried to convince her and exhort her to take meds delivered by [facility].  She perseverated about there being "too many pills" and one of the pills making her sick  I suggested [facility] could POSSIBLY change regimen.  I am worried she will worsen off effective therapy and require hospital admisssion  I have asked that she please keep her visit with me in early January.

## 2019-02-20 ENCOUNTER — Telehealth: Payer: Self-pay

## 2019-02-20 NOTE — Telephone Encounter (Signed)
COVID-19 Pre-Screening Questions:02/20/19  Do you currently have a fever (>100 F), chills or unexplained body aches? NO  Are you currently experiencing new cough, shortness of breath, sore throat, runny nose? NO COUGH OR FEVER SINCE SHE HAS LEAVING THE HOSPITAL  .  Have you recently travelled outside the state of [place] in the last 14 days? NO  .  Have you been in contact with someone that is currently pending confirmation of Covid19 testing or has been confirmed to have the Covid19 virus?  NO  **If the patient answers NO to ALL questions -  advise the patient to please call the clinic before coming to the office should any symptoms develop.

## 2019-02-21 ENCOUNTER — Other Ambulatory Visit: Payer: Self-pay

## 2019-02-21 ENCOUNTER — Ambulatory Visit (INDEPENDENT_AMBULATORY_CARE_PROVIDER_SITE_OTHER): Payer: Self-pay | Admitting: Infectious Disease

## 2019-02-21 ENCOUNTER — Encounter: Payer: Self-pay | Admitting: Infectious Disease

## 2019-02-21 VITALS — BP 107/76 | HR 103 | Ht 63.0 in | Wt 96.0 lb

## 2019-02-21 DIAGNOSIS — Z0389 Encounter for observation for other suspected diseases and conditions ruled out: Secondary | ICD-10-CM

## 2019-02-21 DIAGNOSIS — J90 Pleural effusion, not elsewhere classified: Secondary | ICD-10-CM

## 2019-02-21 DIAGNOSIS — N838 Other noninflammatory disorders of ovary, fallopian tube and broad ligament: Secondary | ICD-10-CM

## 2019-02-21 DIAGNOSIS — Z9889 Other specified postprocedural states: Secondary | ICD-10-CM

## 2019-02-21 DIAGNOSIS — A199 Miliary tuberculosis, unspecified: Secondary | ICD-10-CM

## 2019-02-21 DIAGNOSIS — R188 Other ascites: Secondary | ICD-10-CM

## 2019-02-21 HISTORY — DX: Other noninflammatory disorders of ovary, fallopian tube and broad ligament: N83.8

## 2019-02-21 NOTE — Progress Notes (Signed)
Subjective:    Patient ID: Nancy Hurst, female    DOB: [birth year], 29 y.o.   MRN: [id number]  HPI   29 y.o. female with high fevers lymphocytic exudative pleural effusion and peritonitis, multiple findings in the abdomen concerning for extrapulmonary TB including including possible gastric involvement multiple lymph nodes enlarged and soft tissue and adnexal pathology.  This latter item was also further evaluate a pelvic ultrasound which had shown a loculated fluid collection.  OB/GYN think this could be TB versus bacterial pyosalpinx, but they are not wanting to intervene to get tissue to help [place] understand if it is 1 versus the other.  Similarly IR  Could not get at the area but have offered omental sampling.  We repeated the CT scan which shows reaccumulation of pleural and peritoneal fluid.  Because OB/GYN did not feel that there is need for intervention and IR did not have a good target we decided to start empiric therapy for extrapulmonary tuberculosis with RI PE.  She was discharged on 24 December but roughly 5 days into being at home she was refusing therapy from the health department claiming that it was making her feel sick and that her symptoms had resolved.  I called her and try to coach her into taking RI PE but she would not do this.  She wanted proof that she had extrapulmonary TB I try to explain that proving extrapulmonary TB particular in the pleural space is very difficult on the cultures cannot really be relied upon.  She returns to clinic for follow-up today.  It turns out since I last saw the patient that she actually did reinitiate on antituberculosis therapy on January 4 and is continued on that since then.  She has had no recurrence of fevers she has had no recurrence of fluid buildup in her abdomen and around her lungs.  She does have some numbness where they did the thoracentesis posteriorly and also some numbness along her rib cage where they did not do  the paracentesis which is of unclear significance.  She is having much less nausea now that she is taking her RIP E drugs with juice rather than water.  She is on schedule to finish 6 to 9 months of therapy.    Past Medical History:  Diagnosis Date  . Ovarian mass 02/21/2019    Past Surgical History:  Procedure Laterality Date  . BREAST SURGERY     left breast  . IR PARACENTESIS  01/26/2019  . IR THORACENTESIS ASP PLEURAL SPACE W/IMG GUIDE  01/25/2019  . LAPAROSCOPIC APPENDECTOMY N/A 11/03/2017   Procedure: APPENDECTOMY LAPAROSCOPIC;  Surgeon: Almond Lint, MD;  Location: [facility];  Service: General;  Laterality: N/A;    Family History  Family history unknown: Yes      Social History   Socioeconomic History  . Marital status: Single    Spouse name: Not on file  . Number of children: Not on file  . Years of education: Not on file  . Highest education level: Not on file  Occupational History  . Not on file  Tobacco Use  . Smoking status: Never Smoker  . Smokeless tobacco: Never Used  Substance and Sexual Activity  . Alcohol use: No  . Drug use: No  . Sexual activity: Yes    Birth control/protection: None  Other Topics Concern  . Not on file  Social History Narrative   ** Merged History Encounter **       Social Determinants  of Health   Financial Resource Strain:   . Difficulty of Paying Living Expenses: Not on file  Food Insecurity:   . Worried About Charity fundraiser in the Last Year: Not on file  . Ran Out of Food in the Last Year: Not on file  Transportation Needs: No Transportation Needs  . Lack of Transportation (Medical): No  . Lack of Transportation (Non-Medical): No  Physical Activity:   . Days of Exercise per Week: Not on file  . Minutes of Exercise per Session: Not on file  Stress:   . Feeling of Stress : Not on file  Social Connections:   . Frequency of Communication with Friends and Family: Not on file  . Frequency of Social Gatherings with  Friends and Family: Not on file  . Attends Religious Services: Not on file  . Active Member of Clubs or Organizations: Not on file  . Attends Archivist Meetings: Not on file  . Marital Status: Not on file    No Known Allergies   Current Outpatient Medications:  .  acetaminophen (TYLENOL) 500 MG tablet, Take 1,000 mg by mouth every 6 (six) hours as needed for mild pain., Disp: , Rfl:  .  dextromethorphan-guaiFENesin (MUCINEX DM) 30-600 MG 12hr tablet, Take 2 tablets by mouth 2 (two) times daily. (Patient not taking: Reported on 02/21/2019), Disp: 30 tablet, Rfl: 0   Review of Systems  Constitutional: Negative for chills and fever.  HENT: Negative for congestion and sore throat.   Eyes: Negative for photophobia.  Respiratory: Negative for cough, shortness of breath and wheezing.   Cardiovascular: Negative for chest pain, palpitations and leg swelling.  Gastrointestinal: Negative for abdominal pain, blood in stool, constipation, diarrhea, nausea and vomiting.  Genitourinary: Negative for dysuria, flank pain and hematuria.  Musculoskeletal: Negative for back pain and myalgias.  Skin: Negative for color change, pallor and rash.  Neurological: Negative for dizziness, weakness and headaches.  Hematological: Does not bruise/bleed easily.  Psychiatric/Behavioral: Negative for agitation, behavioral problems and suicidal ideas.       Objective:   Physical Exam Constitutional:      General: She is not in acute distress.    Appearance: She is not diaphoretic.  HENT:     Head: Normocephalic and atraumatic.     Right Ear: External ear normal.     Left Ear: External ear normal.     Nose: Nose normal.     Mouth/Throat:     Pharynx: No oropharyngeal exudate.  Eyes:     General: No scleral icterus.    Conjunctiva/sclera: Conjunctivae normal.     Pupils: Pupils are equal, round, and reactive to light.  Cardiovascular:     Rate and Rhythm: Normal rate and regular rhythm.      Heart sounds: Normal heart sounds. No murmur. No friction rub. No gallop.   Pulmonary:     Effort: Pulmonary effort is normal. No respiratory distress.     Breath sounds: Normal breath sounds. No stridor. No wheezing, rhonchi or rales.  Abdominal:     General: Abdomen is flat. Bowel sounds are normal. There is no distension.     Palpations: Abdomen is soft.     Tenderness: There is no abdominal tenderness. There is no rebound.  Musculoskeletal:        General: No tenderness. Normal range of motion.     Cervical back: Normal range of motion and neck supple.  Lymphadenopathy:     Cervical: No cervical adenopathy.  Skin:    General: Skin is warm and dry.     Coloration: Skin is not pale.     Findings: No erythema or rash.  Neurological:     General: No focal deficit present.     Mental Status: She is alert and oriented to person, place, and time.     Coordination: Coordination normal.  Psychiatric:        Mood and Affect: Mood normal.        Behavior: Behavior normal.        Thought Content: Thought content normal.        Judgment: Judgment normal.           Assessment & Plan:   Likely extrapulmonary tuberculosis with pulmonary infection of the pleural space, stomach adnexa and ovaries:  She seems to responding nicely to antituberculosis therapy so I think ovarian neoplasm is not likely.  I will check some tumor markers for thoroughness sake along with a CMP CBC with differential and sed rate and CRP.  I will see her back in 5 months time but she is otherwise continue on with treatment through the health departmen  I spent greater than 25 minutes with the patient including greater than 50% of time in face to face counsel of the patient and her husband using in person Spanish translator regarding the nature of pleural effusions and the work-up for extrapulmonary tuberculosis labs that have been done that could be done diagnostic procedures that were considered but were not done  and reasons for empiric therapy having been pursued and in coordination of her care t

## 2019-02-22 LAB — COMPLETE METABOLIC PANEL WITH GFR
AG Ratio: 0.9 (calc) — ABNORMAL LOW (ref 1.0–2.5)
ALT: 8 U/L (ref 6–29)
AST: 21 U/L (ref 10–30)
Albumin: 4.2 g/dL (ref 3.6–5.1)
Alkaline phosphatase (APISO): 61 U/L (ref 31–125)
BUN: 7 mg/dL (ref 7–25)
CO2: 23 mmol/L (ref 20–32)
Calcium: 9.7 mg/dL (ref 8.6–10.2)
Chloride: 100 mmol/L (ref 98–110)
Creat: 0.59 mg/dL (ref 0.50–1.10)
GFR, Est African American: 146 mL/min/{1.73_m2} (ref >=60)
GFR, Est Non African American: 126 mL/min/{1.73_m2} (ref >=60)
Globulin: 4.6 g/dL (calc) — ABNORMAL HIGH (ref 1.9–3.7)
Glucose, Bld: 89 mg/dL (ref 65–99)
Potassium: 4.9 mmol/L (ref 3.5–5.3)
Sodium: 134 mmol/L — ABNORMAL LOW (ref 135–146)
Total Bilirubin: 0.3 mg/dL (ref 0.2–1.2)
Total Protein: 8.8 g/dL — ABNORMAL HIGH (ref 6.1–8.1)

## 2019-02-22 LAB — CBC WITH DIFFERENTIAL/PLATELET
Absolute Monocytes: 508 cells/uL (ref 200–950)
Basophils Absolute: 87 cells/uL (ref 0–200)
Basophils Relative: 1.4 %
Eosinophils Absolute: 267 cells/uL (ref 15–500)
Eosinophils Relative: 4.3 %
HCT: 34.6 % — ABNORMAL LOW (ref 35.0–45.0)
Hemoglobin: 11 g/dL — ABNORMAL LOW (ref 11.7–15.5)
Lymphs Abs: 1029 cells/uL (ref 850–3900)
MCH: 25.5 pg — ABNORMAL LOW (ref 27.0–33.0)
MCHC: 31.8 g/dL — ABNORMAL LOW (ref 32.0–36.0)
MCV: 80.3 fL (ref 80.0–100.0)
MPV: 8.8 fL (ref 7.5–12.5)
Monocytes Relative: 8.2 %
Neutro Abs: 4309 cells/uL (ref 1500–7800)
Neutrophils Relative %: 69.5 %
Platelets: 678 10*3/uL — ABNORMAL HIGH (ref 140–400)
RBC: 4.31 10*6/uL (ref 3.80–5.10)
RDW: 14.7 % (ref 11.0–15.0)
Total Lymphocyte: 16.6 %
WBC: 6.2 10*3/uL (ref 3.8–10.8)

## 2019-02-22 LAB — LACTATE DEHYDROGENASE: LDH: 184 U/L (ref 100–200)

## 2019-02-22 LAB — C-REACTIVE PROTEIN: CRP: 33.6 mg/L — ABNORMAL HIGH (ref <8.0)

## 2019-02-22 LAB — HCG, QUANTITATIVE, PREGNANCY: HCG, Total, QN: <3 m[IU]/mL

## 2019-02-22 LAB — SEDIMENTATION RATE: Sed Rate: 58 mm/h — ABNORMAL HIGH (ref 0–20)

## 2019-02-22 LAB — AFP TUMOR MARKER: AFP-Tumor Marker: 1.7 ng/mL

## 2019-02-26 LAB — FUNGAL ORGANISM REFLEX

## 2019-02-26 LAB — FUNGUS CULTURE WITH STAIN

## 2019-02-26 LAB — FUNGUS CULTURE RESULT

## 2019-02-27 LAB — FUNGUS CULTURE WITH STAIN

## 2019-02-27 LAB — FUNGAL ORGANISM REFLEX

## 2019-02-27 LAB — FUNGUS CULTURE RESULT

## 2019-03-07 ENCOUNTER — Telehealth: Payer: Self-pay | Admitting: *Deleted

## 2019-03-07 NOTE — Telephone Encounter (Signed)
Nancy Hurst, Nancy Grinder, MD  [name], [name], MD; Nancy Hurst Nancy Hurst Pool  I saw this morning and micro called me. I am alerting the health department who have been treating her for TB       Previous Messages   ----- Message -----  From: Nancy Deutscher, MD  Sent: 03/05/2019  7:15 PM EST  To: Nancy Hiss, MD   This came to my basket and wasn't sure if you also received it. AFB culture is now positive.   Nancy Hurst

## 2019-03-08 ENCOUNTER — Encounter: Payer: Self-pay | Admitting: Obstetrics and Gynecology

## 2019-03-08 ENCOUNTER — Other Ambulatory Visit: Payer: Self-pay

## 2019-03-08 ENCOUNTER — Telehealth: Payer: Self-pay

## 2019-03-08 ENCOUNTER — Ambulatory Visit (INDEPENDENT_AMBULATORY_CARE_PROVIDER_SITE_OTHER): Payer: Self-pay | Admitting: Obstetrics and Gynecology

## 2019-03-08 DIAGNOSIS — A1810 Tuberculosis of genitourinary system, unspecified: Secondary | ICD-10-CM

## 2019-03-08 NOTE — Telephone Encounter (Signed)
Received call today from Terri at [facility] office. Office would like to know if patient would be okay with doing virtual or in office visit. Will forward message to MD [phone] Lorenso Courier, CMA

## 2019-03-08 NOTE — Progress Notes (Signed)
Ms Nancy Hurst presents for hospital follow up of urogenital TB involving left adnexa. She reports feeling better. No more fever, or night swears. Denies abd pain. LMP 02/17/19 Taking TB meds via [facility] Sexual active and using condoms  PE AF VSS Lungs clear Heart RRR Abd soft + BS  A/P Urogenital TB Advised not to attempt to conceive.  Complete TB meds  Will check GYN U/S, f/u per results.

## 2019-03-09 LAB — ACID FAST CULTURE WITH REFLEXED SENSITIVITIES (MYCOBACTERIA): Acid Fast Culture: NEGATIVE

## 2019-03-09 NOTE — Telephone Encounter (Signed)
TB is not an issue since on treatment for weeks and is extrapulmonary

## 2019-03-16 ENCOUNTER — Ambulatory Visit (HOSPITAL_COMMUNITY)
Admission: RE | Admit: 2019-03-16 | Discharge: 2019-03-16 | Disposition: A | Discharge status: Home / Self Care | Payer: Self-pay | Source: Ambulatory Visit | Attending: Obstetrics and Gynecology | Admitting: Obstetrics and Gynecology

## 2019-04-06 ENCOUNTER — Other Ambulatory Visit: Payer: Self-pay | Admitting: Obstetrics and Gynecology

## 2019-04-06 ENCOUNTER — Other Ambulatory Visit: Payer: Self-pay

## 2019-04-06 ENCOUNTER — Ambulatory Visit
Admission: RE | Admit: 2019-04-06 | Discharge: 2019-04-06 | Disposition: A | Discharge status: Home / Self Care | Payer: No Typology Code available for payment source | Source: Ambulatory Visit | Attending: Obstetrics and Gynecology | Admitting: Obstetrics and Gynecology

## 2019-04-06 DIAGNOSIS — A159 Respiratory tuberculosis unspecified: Secondary | ICD-10-CM

## 2019-04-30 LAB — MTB SUSCEPTIBILITY BROTH, REFLEXED

## 2019-04-30 LAB — AFB ORGANISM ID BY DNA PROBE
M avium complex: NEGATIVE
M tuberculosis complex: POSITIVE — AB

## 2019-04-30 LAB — ACID FAST CULTURE WITH REFLEXED SENSITIVITIES (MYCOBACTERIA): Acid Fast Culture: POSITIVE — AB

## 2019-06-06 ENCOUNTER — Other Ambulatory Visit: Payer: No Typology Code available for payment source

## 2019-06-15 ENCOUNTER — Other Ambulatory Visit: Payer: Self-pay

## 2019-06-15 DIAGNOSIS — N6320 Unspecified lump in the left breast, unspecified quadrant: Secondary | ICD-10-CM

## 2019-07-05 ENCOUNTER — Ambulatory Visit
Admission: RE | Admit: 2019-07-05 | Discharge: 2019-07-05 | Disposition: A | Discharge status: Home / Self Care | Payer: No Typology Code available for payment source | Source: Ambulatory Visit | Attending: Obstetrics and Gynecology | Admitting: Obstetrics and Gynecology

## 2019-07-05 ENCOUNTER — Ambulatory Visit: Payer: Self-pay | Admitting: *Deleted

## 2019-07-05 ENCOUNTER — Other Ambulatory Visit: Payer: Self-pay

## 2019-07-05 DIAGNOSIS — Z1239 Encounter for other screening for malignant neoplasm of breast: Secondary | ICD-10-CM

## 2019-07-05 DIAGNOSIS — N6320 Unspecified lump in the left breast, unspecified quadrant: Secondary | ICD-10-CM

## 2019-07-05 NOTE — Progress Notes (Signed)
Error

## 2019-07-05 NOTE — Patient Instructions (Signed)
Explained breast self awareness with [name]. Patient did not need a Pap smear today due to last Pap smear was 06/01/2018. Let her know BCCCP will cover Pap smears every 3 years unless has a history of abnormal Pap smears. Referred patient to the [facility] of [facility] for a left breast ultrasound. Appointment scheduled Thursday, Jul 05, 2019 at 0950. Patient aware of appointment and will be there. Nancy Hurst verbalized understanding.  Amanii Snethen, Kathaleen Maser, RN 8:52 AM

## 2019-07-05 NOTE — Progress Notes (Signed)
[name] is a 29 y.o. female who presents to [facility] clinic today with complaints of 3-4 left breast lumps that per patient have been followed since 06/01/2018.     Pap Smear: Pap not smear completed today. Last Pap smear was 06/01/2018 at [facility] clinic and was normal. Per patient her last Pap smear is the only Pap smear she has had. Last Pap smear result is in Epic.   Physical exam: Breasts Breasts symmetrical. No skin abnormalities right breast. Observed two scars on left lower and upper inner breast from history of lumpectomy in 2013 for benign cysts. No nipple retraction bilateral breasts. No nipple discharge bilateral breasts. No lymphadenopathy. No lumps palpated right breast. Palpated four lumps within the left breast. A mobile lump at 2 o'clock 8 cm from the nipple, bb sized lump 1 o'clock 7 cm from the nipple, 3 o'clock 4 cm from the nipple, and a mobile lump 6 o'clock 2 cm from the nipple. No complaints of pain or tenderness on exam.   Pelvic/Bimanual Pap is not indicated today per BCCCP guidelines.   Smoking History: Patient has never smoked.   Patient Navigation: Patient education provided. Access to services provided for patient through [place] program. Spanish interpreter Nancy Hurst from [facility] provided.    Breast and Cervical Cancer Risk Assessment: Patient no family history of breast cancer, known genetic mutations, or radiation treatment to the chest before age 35. Patient has no history of cervical dysplasia, immunocompromised, or DES exposure in-utero. Breast Cancer risk assessment completed. No breast cancer risk calculated due to patient is less than 89 years old.    A: BCCCP exam without pap smear.   P: Referred patient to the [facility] of [facility] for a left breast ultrasound. Appointment scheduled Thursday, Jul 05, 2019 at 0950.  Priscille Heidelberg, RN 07/05/2019 8:51 AM

## 2019-07-23 ENCOUNTER — Ambulatory Visit: Payer: Self-pay | Admitting: Infectious Disease

## 2019-08-20 ENCOUNTER — Encounter (HOSPITAL_COMMUNITY): Payer: Self-pay | Admitting: Emergency Medicine

## 2019-08-20 ENCOUNTER — Emergency Department (HOSPITAL_COMMUNITY)
Admission: EM | Admit: 2019-08-20 | Discharge: 2019-08-21 | Disposition: A | Discharge status: Home / Self Care | Payer: Self-pay | Attending: Emergency Medicine | Admitting: Emergency Medicine

## 2019-08-20 ENCOUNTER — Other Ambulatory Visit: Payer: Self-pay

## 2019-08-20 DIAGNOSIS — K2970 Gastritis, unspecified, without bleeding: Secondary | ICD-10-CM | POA: Insufficient documentation

## 2019-08-20 LAB — COMPREHENSIVE METABOLIC PANEL
ALT: 14 U/L (ref 0–44)
AST: 18 U/L (ref 15–41)
Albumin: 4.1 g/dL (ref 3.5–5.0)
Alkaline Phosphatase: 36 U/L — ABNORMAL LOW (ref 38–126)
Anion gap: 8 (ref 5–15)
BUN: 11 mg/dL (ref 6–20)
CO2: 22 mmol/L (ref 22–32)
Calcium: 8.8 mg/dL — ABNORMAL LOW (ref 8.9–10.3)
Chloride: 109 mmol/L (ref 98–111)
Creatinine, Ser: 0.54 mg/dL (ref 0.44–1.00)
GFR calc Af Amer: >60 mL/min (ref >60)
GFR calc non Af Amer: >60 mL/min (ref >60)
Glucose, Bld: 112 mg/dL — ABNORMAL HIGH (ref 70–99)
Potassium: 4.1 mmol/L (ref 3.5–5.1)
Sodium: 139 mmol/L (ref 135–145)
Total Bilirubin: 0.8 mg/dL (ref 0.3–1.2)
Total Protein: 7.1 g/dL (ref 6.5–8.1)

## 2019-08-20 LAB — URINALYSIS, ROUTINE W REFLEX MICROSCOPIC
Bilirubin Urine: NEGATIVE
Glucose, UA: NEGATIVE mg/dL
Hgb urine dipstick: NEGATIVE
Ketones, ur: NEGATIVE mg/dL
Leukocytes,Ua: NEGATIVE
Nitrite: NEGATIVE
Protein, ur: 30 mg/dL — AB
Specific Gravity, Urine: 1.033 — ABNORMAL HIGH (ref 1.005–1.030)
pH: 5 (ref 5.0–8.0)

## 2019-08-20 LAB — I-STAT BETA HCG BLOOD, ED (MC, WL, AP ONLY): I-stat hCG, quantitative: <5 m[IU]/mL (ref <5)

## 2019-08-20 LAB — CBC
HCT: 37.3 % (ref 36.0–46.0)
Hemoglobin: 12.3 g/dL (ref 12.0–15.0)
MCH: 30.1 pg (ref 26.0–34.0)
MCHC: 33 g/dL (ref 30.0–36.0)
MCV: 91.2 fL (ref 80.0–100.0)
Platelets: 363 10*3/uL (ref 150–400)
RBC: 4.09 MIL/uL (ref 3.87–5.11)
RDW: 15.1 % (ref 11.5–15.5)
WBC: 12.5 10*3/uL — ABNORMAL HIGH (ref 4.0–10.5)
nRBC: 0 % (ref 0.0–0.2)

## 2019-08-20 LAB — LIPASE, BLOOD: Lipase: 40 U/L (ref 11–51)

## 2019-08-20 MED ORDER — SODIUM CHLORIDE 0.9% FLUSH: 3.0000 mL | Freq: Once | INTRAVENOUS | Status: DC

## 2019-08-20 NOTE — ED Triage Notes (Signed)
Patient reports pain across her abdomen for several days , denies emesis or diarrhea , denies fever or chills, no dysuria or vaginal discharge .

## 2019-08-21 ENCOUNTER — Other Ambulatory Visit: Payer: Self-pay | Admitting: Nurse Practitioner

## 2019-08-21 ENCOUNTER — Emergency Department (HOSPITAL_COMMUNITY): Payer: Self-pay

## 2019-08-21 DIAGNOSIS — R109 Unspecified abdominal pain: Secondary | ICD-10-CM

## 2019-08-21 MED ORDER — IOHEXOL 300 MG/ML  SOLN
100.0000 mL | Freq: Once | INTRAMUSCULAR | Status: AC | PRN
  Administered 2019-08-21: 100 mL via INTRAVENOUS

## 2019-08-21 MED ORDER — LIDOCAINE VISCOUS HCL 2 % MT SOLN
15.0000 mL | Freq: Once | OROMUCOSAL | Status: AC
  Administered 2019-08-21: 15 mL via ORAL
  Filled 2019-08-21: qty 15

## 2019-08-21 MED ORDER — ALUM & MAG HYDROXIDE-SIMETH 200-200-20 MG/5ML PO SUSP
30.0000 mL | Freq: Once | ORAL | Status: AC
  Administered 2019-08-21: 30 mL via ORAL
  Filled 2019-08-21: qty 30

## 2019-08-21 MED ORDER — FAMOTIDINE 20 MG PO TABS: 20.0000 mg | ORAL_TABLET | Freq: Two times a day (BID) | ORAL | 0 refills | Status: DC

## 2019-08-21 NOTE — ED Provider Notes (Signed)
[facility] EMERGENCY DEPARTMENT Provider Note   CSN: [id number] Arrival date & time: 08/20/19  2124     History Chief Complaint  Patient presents with  . Abdominal Pain    Nancy Hurst Nancy Hurst is a 29 y.o. female.  29 year old female with prior medical history as detailed below presents for evaluation abdominal pain.  Patient reports epigastric abdominal discomfort for the last 10 to 15 days.  Patient denies associated nausea, vomiting, fever, bowel movement change, vaginal discharge, urinary symptoms, or other acute complaint.  She has not tried any medications at home for her symptoms.    History is obtained with assistance of translator.  The history is provided by the patient. A language interpreter was used.  Abdominal Pain Pain location:  Epigastric Pain quality: aching and cramping   Pain radiates to:  Does not radiate Pain severity:  Mild Onset quality:  Gradual Duration:  15 days Timing:  Constant Progression:  Waxing and waning Chronicity:  New Relieved by:  Nothing Worsened by:  Nothing Ineffective treatments:  None tried Associated symptoms: no fever        Past Medical History:  Diagnosis Date  . Ovarian mass 02/21/2019  . Tuberculosis     Patient Active Problem List   Diagnosis Date Noted  . Tuberculosis, urogenital 03/08/2019  . Ovarian mass 02/21/2019  . S/P thoracentesis   . Disseminated tuberculosis   . Observation for suspected tuberculosis (TB)   . Normocytic anemia 01/27/2019  . Moderate protein-calorie malnutrition (HCC) 01/27/2019  . Fever 01/27/2019  . Biological false positive RPR test 01/27/2019  . Pleural effusion on left 01/24/2019  . Abdominal ascites 01/24/2019  . Recurrent UTI   . Perforated appendicitis 11/03/2017    Past Surgical History:  Procedure Laterality Date  . BREAST SURGERY     left breast  . IR PARACENTESIS  01/26/2019  . IR THORACENTESIS ASP PLEURAL SPACE W/IMG GUIDE  01/25/2019    . LAPAROSCOPIC APPENDECTOMY N/A 11/03/2017   Procedure: APPENDECTOMY LAPAROSCOPIC;  Surgeon: Almond Lint, MD;  Location: [facility];  Service: General;  Laterality: N/A;     OB History    Gravida  0   Para  0   Term  0   Preterm  0   AB  0   Living  0     SAB  0   TAB  0   Ectopic  0   Multiple  0   Live Births  0           Family History  Family history unknown: Yes    Social History   Tobacco Use  . Smoking status: Never Smoker  . Smokeless tobacco: Never Used  Vaping Use  . Vaping Use: Never used  Substance Use Topics  . Alcohol use: No  . Drug use: No    Home Medications Prior to Admission medications   Medication Sig Start Date End Date Taking? Authorizing Provider  acetaminophen (TYLENOL) 500 MG tablet Take 1,000 mg by mouth every 6 (six) hours as needed for mild pain.    [provider]  dextromethorphan-guaiFENesin (MUCINEX DM) 30-600 MG 12hr tablet Take 2 tablets by mouth 2 (two) times daily. Patient not taking: Reported on 02/21/2019 02/01/19   Azucena Fallen, MD    Allergies    Patient has no known allergies.  Review of Systems   Review of Systems  Constitutional: Negative for fever.  Gastrointestinal: Positive for abdominal pain.  All other systems reviewed  and are negative.   Physical Exam Updated Vital Signs BP 98/68 (BP Location: Right Arm)   Pulse 69   Temp 98.7 F (37.1 C) (Oral)   Resp 16   Ht 5\' 5"  (1.651 m)   Wt 51 kg   LMP 07/15/2019   SpO2 100%   BMI 18.71 kg/m   Physical Exam Vitals and nursing note reviewed.  Constitutional:      General: She is not in acute distress.    Appearance: She is well-developed.  HENT:     Head: Normocephalic and atraumatic.  Eyes:     Conjunctiva/sclera: Conjunctivae normal.     Pupils: Pupils are equal, round, and reactive to light.  Cardiovascular:     Rate and Rhythm: Normal rate and regular rhythm.     Heart sounds: Normal heart sounds.  Pulmonary:      Effort: Pulmonary effort is normal. No respiratory distress.     Breath sounds: Normal breath sounds.  Abdominal:     General: Bowel sounds are normal. There is no distension.     Palpations: Abdomen is soft.     Tenderness: There is no abdominal tenderness.  Musculoskeletal:        General: No deformity. Normal range of motion.     Cervical back: Normal range of motion and neck supple.  Skin:    General: Skin is warm and dry.  Neurological:     Mental Status: She is alert and oriented to person, place, and time.     ED Results / Procedures / Treatments   Labs (all labs ordered are listed, but only abnormal results are displayed) Labs Reviewed  COMPREHENSIVE METABOLIC PANEL - Abnormal; Notable for the following components:      Result Value   Glucose, Bld 112 (*)    Calcium 8.8 (*)    Alkaline Phosphatase 36 (*)    All other components within normal limits  CBC - Abnormal; Notable for the following components:   WBC 12.5 (*)    All other components within normal limits  URINALYSIS, ROUTINE W REFLEX MICROSCOPIC - Abnormal; Notable for the following components:   Color, Urine AMBER (*)    Specific Gravity, Urine 1.033 (*)    Protein, ur 30 (*)    Bacteria, UA FEW (*)    All other components within normal limits  LIPASE, BLOOD  I-STAT BETA HCG BLOOD, ED ([facility], [facility], [facility] ONLY)    EKG None  Radiology No results found.  Procedures Procedures (including critical care time)  Medications Ordered in ED Medications  sodium chloride flush (NS) 0.9 % injection 3 mL (has no administration in time range)  alum & mag hydroxide-simeth (MAALOX/MYLANTA) 200-200-20 MG/5ML suspension 30 mL (30 mLs Oral Given 08/21/19 0818)    And  lidocaine (XYLOCAINE) 2 % viscous mouth solution 15 mL (15 mLs Oral Given 08/21/19 0818)    ED Course  I have reviewed the triage vital signs and the nursing notes.  Pertinent labs & imaging results that were available during my care of the patient were  reviewed by me and considered in my medical decision making (see chart for details).  Clinical Course as of Aug 21 1027  Tue Aug 21, 2019  1017 Platelets: 363 [PM]    Clinical Course User Index [PM] Aug 23, 2019, MD   MDM Rules/Calculators/A&P                          MDM  Screen complete  Tieshia Rettinger was evaluated in Emergency Department on 08/21/2019 for the symptoms described in the history of present illness. She was evaluated in the context of the global COVID-19 pandemic, which necessitated consideration that the patient might be at risk for infection with the SARS-CoV-2 virus that causes COVID-19. Institutional protocols and algorithms that pertain to the evaluation of patients at risk for COVID-19 are in a state of rapid change based on information released by regulatory bodies including the CDC and federal and state organizations. These policies and algorithms were followed during the patient's care in the ED.  Patient is presenting for evaluation of epigastric abdominal discomfort. Patient's describe symptoms are most consistent with likely gastritis. Screening exam and evaluation performed in ED did not reveal evidence of significant acute pathology. 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 Patient's describe symptoms today are not consistent with likely complication related to her prior diagnosis of intraperitoneal TB.  At time of discharge, patient does feel improved. She desires discharge home. She does understand the need for close follow-up. Strict return precautions given understood. We will trial course of Pepcid for treatment of her suspected gastritis.  Discharge discussion occurred with assistance of interpreter.  Final Clinical Impression(s) / ED Diagnoses Final diagnoses:  Gastritis without bleeding, unspecified chronicity, unspecified gastritis type    Rx / DC Orders ED Discharge Orders          Ordered    famotidine (PEPCID) 20 MG tablet  2 times daily     Discontinue  Reprint     08/21/19 1028           Wynetta Fines, MD 08/21/19 1030

## 2019-08-21 NOTE — ED Notes (Signed)
Pt discharge instructions and follow-up care reviewed with the patient. The patient verbalized understanding of instructions. Pt discharged. 

## 2019-08-21 NOTE — Discharge Instructions (Signed)
Please return for any problem. Follow-up with your regular care provider as instructed.  Use Pepcid as prescribed for treatment of your suspected gastritis.  Avoid heavy consumption of alcohol.

## 2019-11-11 ENCOUNTER — Emergency Department (HOSPITAL_COMMUNITY): Payer: Self-pay

## 2019-11-11 ENCOUNTER — Emergency Department (HOSPITAL_COMMUNITY)
Admission: EM | Admit: 2019-11-11 | Discharge: 2019-11-11 | Disposition: A | Discharge status: Home / Self Care | Payer: Self-pay | Attending: Emergency Medicine | Admitting: Emergency Medicine

## 2019-11-11 ENCOUNTER — Other Ambulatory Visit: Payer: Self-pay

## 2019-11-11 ENCOUNTER — Encounter (HOSPITAL_COMMUNITY): Payer: Self-pay | Admitting: Emergency Medicine

## 2019-11-11 DIAGNOSIS — M25511 Pain in right shoulder: Secondary | ICD-10-CM | POA: Insufficient documentation

## 2019-11-11 DIAGNOSIS — Z5321 Procedure and treatment not carried out due to patient leaving prior to being seen by health care provider: Secondary | ICD-10-CM | POA: Insufficient documentation

## 2019-11-11 DIAGNOSIS — Y92410 Unspecified street and highway as the place of occurrence of the external cause: Secondary | ICD-10-CM | POA: Insufficient documentation
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
## 2019-11-16 ENCOUNTER — Ambulatory Visit
Admission: RE | Admit: 2019-11-16 | Discharge: 2019-11-16 | Disposition: A | Discharge status: Home / Self Care | Payer: No Typology Code available for payment source | Source: Ambulatory Visit | Attending: Obstetrics and Gynecology | Admitting: Obstetrics and Gynecology

## 2019-11-16 ENCOUNTER — Other Ambulatory Visit: Payer: Self-pay

## 2019-11-16 ENCOUNTER — Other Ambulatory Visit: Payer: Self-pay | Admitting: Obstetrics and Gynecology

## 2019-11-16 DIAGNOSIS — Z09 Encounter for follow-up examination after completed treatment for conditions other than malignant neoplasm: Secondary | ICD-10-CM

## 2020-04-29 ENCOUNTER — Other Ambulatory Visit: Payer: Self-pay

## 2020-04-29 DIAGNOSIS — Z08 Encounter for follow-up examination after completed treatment for malignant neoplasm: Secondary | ICD-10-CM

## 2020-04-29 DIAGNOSIS — Z853 Personal history of malignant neoplasm of breast: Secondary | ICD-10-CM

## 2020-04-30 ENCOUNTER — Other Ambulatory Visit: Payer: Self-pay | Admitting: Obstetrics and Gynecology

## 2020-04-30 DIAGNOSIS — A1810 Tuberculosis of genitourinary system, unspecified: Secondary | ICD-10-CM

## 2020-07-10 ENCOUNTER — Ambulatory Visit: Payer: Self-pay | Admitting: *Deleted

## 2020-07-10 ENCOUNTER — Other Ambulatory Visit: Payer: Self-pay

## 2020-07-10 VITALS — BP 100/68 | Wt 105.7 lb

## 2020-07-10 DIAGNOSIS — N6320 Unspecified lump in the left breast, unspecified quadrant: Secondary | ICD-10-CM

## 2020-07-10 DIAGNOSIS — Z1239 Encounter for other screening for malignant neoplasm of breast: Secondary | ICD-10-CM

## 2020-07-10 NOTE — Progress Notes (Signed)
[name] is a 30 y.o. female who presents to [facility] clinic today with complaint of 5 left breast lumps that per patient have been followed since 06/01/2018. Last left breast ultrasound was completed 07/05/2019 that left breast ultrasound in one year is recommended for follow-up.   Pap Smear: Pap smear not completed today. [facility].Per patient her last Pap smear is the only Pap smear she has had. Last Pap smear resultis in Epic.   Physical exam: Breasts Breasts symmetrical. No skin abnormalities right breast. Observed two scars on left lower and upper inner breast from history of lumpectomy in 2013 for benign cysts. No nipple retraction bilateral breasts. No nipple discharge bilateral breasts. No lymphadenopathy. No lumps palpated right breast. Palpated six lumps within the left breast. A mobile lump at 2 o'clock 8 cm from the nipple, bb sized lump 12 o'clock 1 cm from nipple, 3 o'clock 4 cm from the nipple, 4 o'clock 2 cm from the nipple, 4:30 o'clock retroareolar, and 5 o'clock 3 cm from the nipple. No complaints of pain or tenderness on exam.    Pelvic/Bimanual Pap is not indicated today per BCCCP guidelines.    Smoking History: Patient has never smoked.   Patient Navigation: Patient education provided. Access to services provided for patient through [place] program. Spanish interpreter Natale Lay from [facility] provided.    Breast and Cervical Cancer Risk Assessment: Patient does not have family history of breast cancer, known genetic mutations, or radiation treatment to the chest before age 36. Patient does not have history of cervical dysplasia, immunocompromised, or DES exposure in-utero. Breast cancer risk assessment completed. No breast cancer risk calculated due to patient is less than 37 years old.  Risk Assessment    Risk Scores      07/10/2020 07/05/2019   Last edited by: Narda Rutherford, LPN McGill, Fidel Levy,  LPN   5-year risk:     Lifetime risk:           A: BCCCP exam without pap smear Complaint of left breast lumps since 06/01/2018.  P: Referred patient to the [facility] Center of [facility] for a left breast ultrasound per recommendation. Appointment scheduled Thursday, July 24, 2020 at 1450.  Priscille Heidelberg, RN 07/10/2020 3:28 PM

## 2020-07-10 NOTE — Patient Instructions (Signed)
Explained breast self awareness with [name]. Patient did not need a Pap smear today due to last Pap smear was 06/01/2018. Let her know BCCCP will cover Pap smears every 3 years unless has a history of abnormal Pap smears. Referred patient to the [facility] of [facility] for a left breast ultrasound per recommendation. Appointment scheduled Thursday, July 24, 2020 at 1450. Patient aware of appointment and will be there. Nancy Hurst verbalized understanding.  Yunique Dearcos, Kathaleen Maser, RN 3:28 PM

## 2020-07-17 ENCOUNTER — Other Ambulatory Visit: Payer: Self-pay | Admitting: Obstetrics and Gynecology

## 2020-07-17 DIAGNOSIS — N63 Unspecified lump in unspecified breast: Secondary | ICD-10-CM

## 2020-07-24 ENCOUNTER — Other Ambulatory Visit: Payer: Self-pay

## 2020-07-24 ENCOUNTER — Ambulatory Visit
Admission: RE | Admit: 2020-07-24 | Discharge: 2020-07-24 | Disposition: A | Discharge status: Home / Self Care | Payer: No Typology Code available for payment source | Source: Ambulatory Visit | Attending: Obstetrics and Gynecology | Admitting: Obstetrics and Gynecology

## 2020-07-24 DIAGNOSIS — N63 Unspecified lump in unspecified breast: Secondary | ICD-10-CM

## 2020-08-13 ENCOUNTER — Other Ambulatory Visit: Payer: Self-pay

## 2020-08-13 ENCOUNTER — Other Ambulatory Visit (HOSPITAL_COMMUNITY)
Admission: RE | Admit: 2020-08-13 | Discharge: 2020-08-13 | Disposition: A | Discharge status: Home / Self Care | Payer: No Typology Code available for payment source | Source: Ambulatory Visit | Attending: Obstetrics & Gynecology | Admitting: Obstetrics & Gynecology

## 2020-08-13 ENCOUNTER — Encounter: Payer: Self-pay | Admitting: Obstetrics & Gynecology

## 2020-08-13 ENCOUNTER — Ambulatory Visit (INDEPENDENT_AMBULATORY_CARE_PROVIDER_SITE_OTHER): Payer: Self-pay | Admitting: Obstetrics & Gynecology

## 2020-08-13 VITALS — BP 108/64 | HR 84 | Resp 16 | Ht 61.5 in | Wt 105.0 lb

## 2020-08-13 DIAGNOSIS — Z113 Encounter for screening for infections with a predominantly sexual mode of transmission: Secondary | ICD-10-CM

## 2020-08-13 DIAGNOSIS — Z01419 Encounter for gynecological examination (general) (routine) without abnormal findings: Secondary | ICD-10-CM

## 2020-08-13 DIAGNOSIS — N979 Female infertility, unspecified: Secondary | ICD-10-CM

## 2020-08-13 DIAGNOSIS — A1810 Tuberculosis of genitourinary system, unspecified: Secondary | ICD-10-CM

## 2020-08-13 NOTE — Progress Notes (Signed)
Nancy Hurst [facility] Sep 14, 1990 [id number]   History:    30 y.o. G0 Married  RP:  New patient presenting for annual gyn exam   HPI: Menses regular normal every month.  No BTB.  No pelvic pain.  No pain with IC.  Attempting conception x 3 years.  No investigation so far.  No h/o STI.  Breasts normal.  Urine/BMs normal.  BMI 19.52.  Past medical history,surgical history, family history and social history were all reviewed and documented in the EPIC chart.  Gynecologic History Patient's last menstrual period was 07/25/2020 (exact date).  Obstetric History OB History  Gravida Para Term Preterm AB Living  0 0 0 0 0 0  SAB IAB Ectopic Multiple Live Births  0 0 0 0 0     ROS: A ROS was performed and pertinent positives and negatives are included in the history.  GENERAL: No fevers or chills. HEENT: No change in vision, no earache, sore throat or sinus congestion. NECK: No pain or stiffness. CARDIOVASCULAR: No chest pain or pressure. No palpitations. PULMONARY: No shortness of breath, cough or wheeze. GASTROINTESTINAL: No abdominal pain, nausea, vomiting or diarrhea, melena or bright red blood per rectum. GENITOURINARY: No urinary frequency, urgency, hesitancy or dysuria. MUSCULOSKELETAL: No joint or muscle pain, no back pain, no recent trauma. DERMATOLOGIC: No rash, no itching, no lesions. ENDOCRINE: No polyuria, polydipsia, no heat or cold intolerance. No recent change in weight. HEMATOLOGICAL: No anemia or easy bruising or bleeding. NEUROLOGIC: No headache, seizures, numbness, tingling or weakness. PSYCHIATRIC: No depression, no loss of interest in normal activity or change in sleep pattern.     Exam:   BP 108/64   Pulse 84   Resp 16   Ht 5' 1.5" (1.562 m)   Wt 105 lb (47.6 kg)   LMP 07/25/2020 (Exact Date)   BMI 19.52 kg/m   Body mass index is 19.52 kg/m.  General appearance : Well developed well nourished female. No acute distress HEENT: Eyes: no retinal hemorrhage or  exudates,  Neck supple, trachea midline, no carotid bruits, no thyroidmegaly Lungs: Clear to auscultation, no rhonchi or wheezes, or rib retractions  Heart: Regular rate and rhythm, no murmurs or gallops Breast:Examined in sitting and supine position were symmetrical in appearance, no palpable masses or tenderness,  no skin retraction, no nipple inversion, no nipple discharge, no skin discoloration, no axillary or supraclavicular lymphadenopathy Abdomen: no palpable masses or tenderness, no rebound or guarding Extremities: no edema or skin discoloration or tenderness  Pelvic: Vulva: Normal             Vagina: No gross lesions or discharge  Cervix: No gross lesions or discharge.  Pap reflex/Gono-Chlam done.  Uterus  AV, normal size, shape and consistency, non-tender and mobile  Adnexa  Without masses or tenderness  Anus: Normal   Assessment/Plan:  30 y.o. female for annual exam   1. Encounter for routine gynecological examination with Papanicolaou smear of cervix Normal gynecologic exam.  Pap reflex done.  Breast exam normal.  Good body mass index at 19.52. - Cytology - PAP( [place])  2. Primary female infertility Primary infertility for 3 years.  Normal regular menstrual periods every month.  No history of STI, but had urogenital tuberculosis treated recently.  We will proceed with hysterosalpingography under [facility] and semen analysis.  Labs to be done on July 8 for a day 21 progesterone, a prolactin and a TSH.  Follow-up for a fertility visit to discuss all results  and decide on management. - TSH; Future - Prolactin; Future - Progesterone; Future - Semen Analysis, Basic; Future - DG Hysterogram (HSG); Future  3. Screen for STD (sexually transmitted disease) - Cytology - PAP( ) with Gono-Chlam - HIV antibody (with reflex); Future - RPR; Future - Hepatitis B Surface AntiGEN; Future - Hepatitis C Antibody; Future  4. Tuberculosis, urogenital  Post  treatment  Genia Del MD, 2:11 PM 08/13/2020

## 2020-08-15 ENCOUNTER — Other Ambulatory Visit: Payer: Self-pay

## 2020-08-19 LAB — CYTOLOGY - PAP
Chlamydia: NEGATIVE
Comment: NEGATIVE
Comment: NORMAL
Diagnosis: NEGATIVE
Neisseria Gonorrhea: NEGATIVE

## 2020-08-26 ENCOUNTER — Ambulatory Visit: Payer: Self-pay | Admitting: Obstetrics & Gynecology

## 2020-11-26 IMAGING — CT CT ABD-PELV W/ CM
2 of 4 series · 15 of 46 positions shown, 17 images · IV contrast (omnipaque)
Comparison: CT scan 01/31/2019

CLINICAL DATA: Nonlocalized abdominal pain. History of
extrapulmonary/abdominal tuberculosis.

EXAM:
CT ABDOMEN AND PELVIS WITH CONTRAST
TECHNIQUE: Multidetector CT imaging of the abdomen and pelvis was performed
using the standard protocol following bolus administration of
intravenous contrast.
CONTRAST:  100mL OMNIPAQUE IOHEXOL 300 MG/ML  SOLN

[Series 4: a/p w/ cor · coronal · 0.66mm/px · 3 of 104 slices shown]
[im 35/104  soft-tissue]
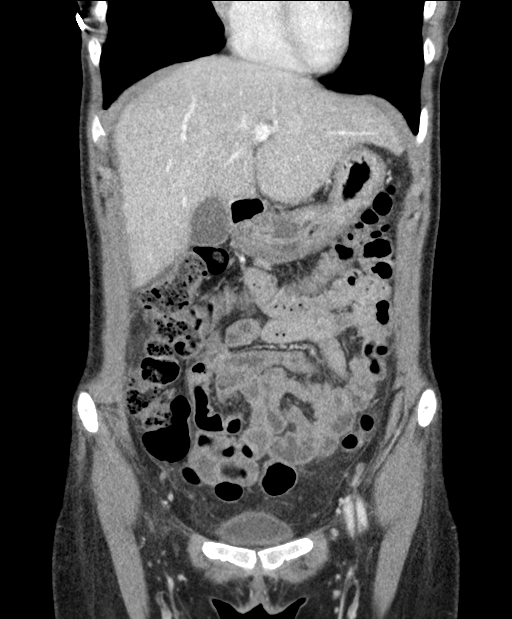
[im 46/104  soft-tissue]
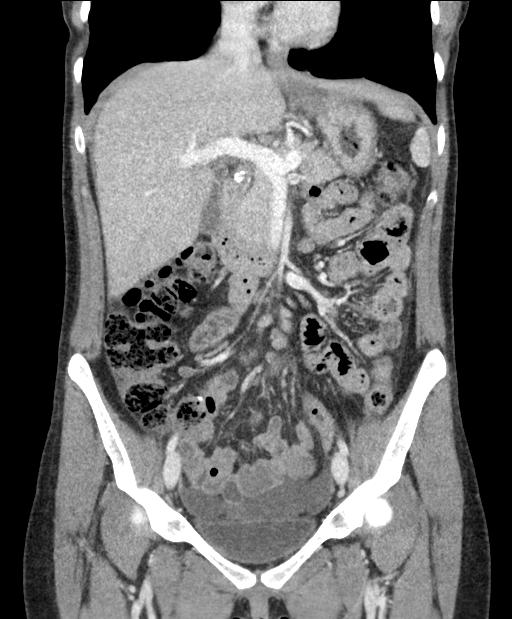
[im 58/104  soft-tissue]
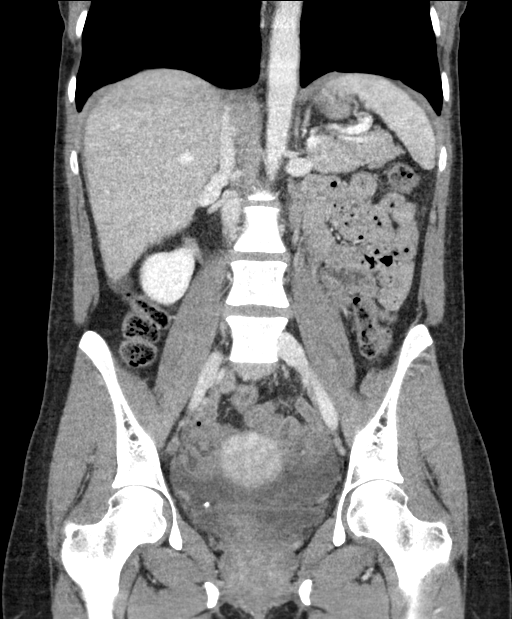

[Series 5: a/p w/ 5mm · axial · 0.66mm/px · z∈[-9,+351]mm · 12 of 83 slices shown, 14 images]
[im 7/83  soft-tissue]
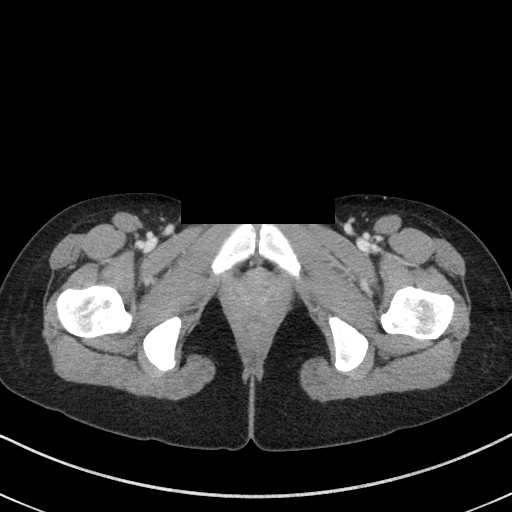
[im 7/83  bone]
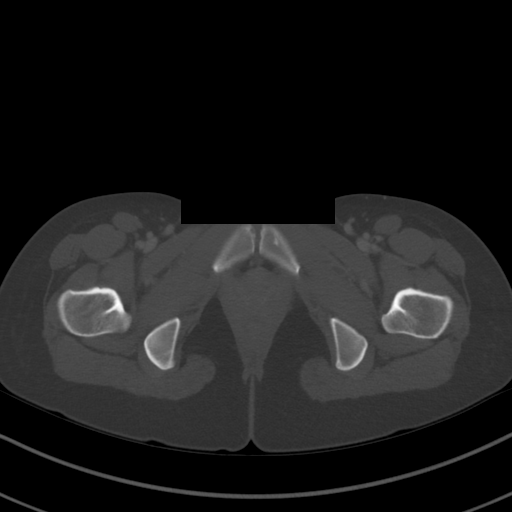
[im 14/83  soft-tissue]
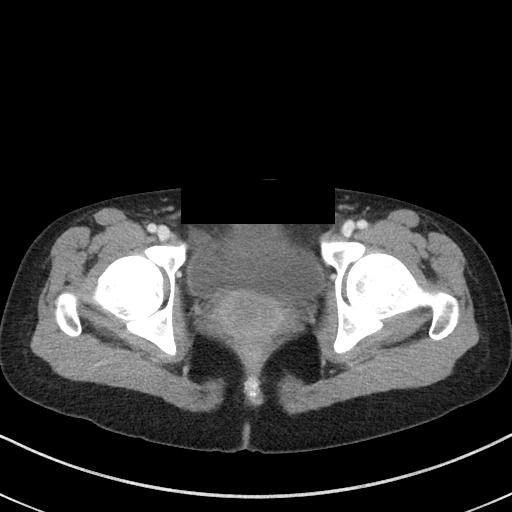
[im 20/83  soft-tissue]
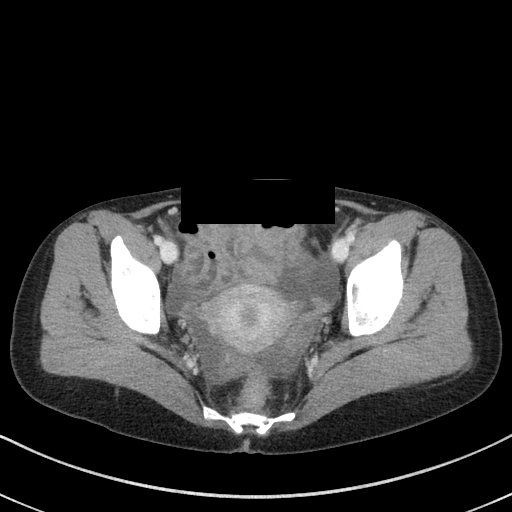
[im 27/83  soft-tissue]
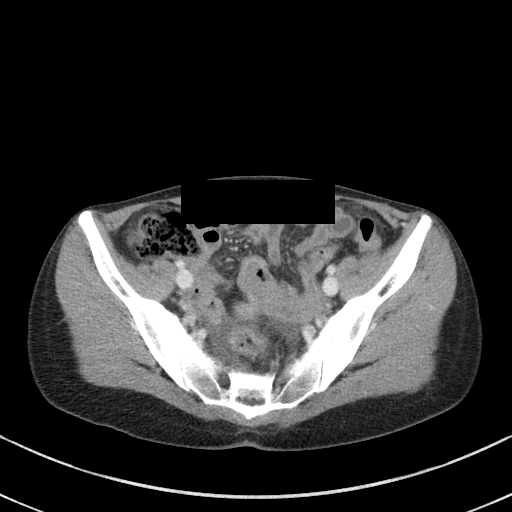
[im 33/83  soft-tissue]
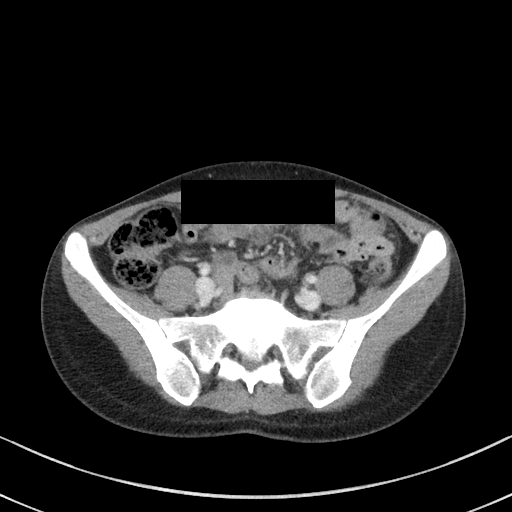
[im 40/83  soft-tissue]
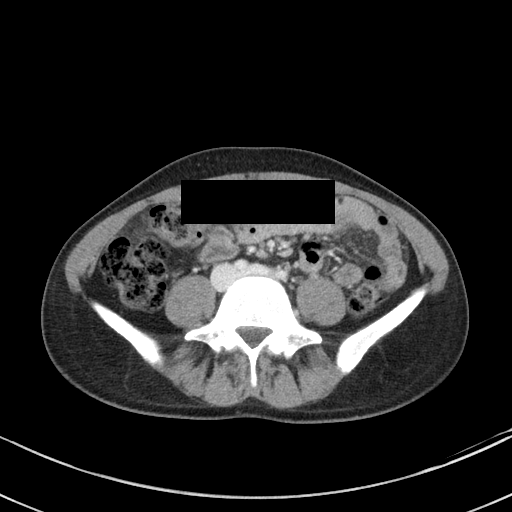
[im 46/83  soft-tissue]
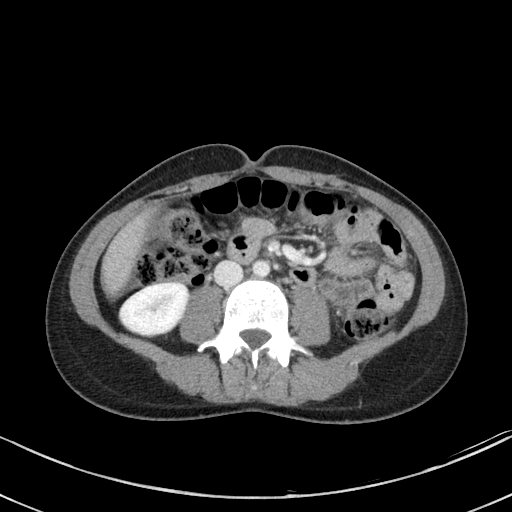
[im 53/83  soft-tissue]
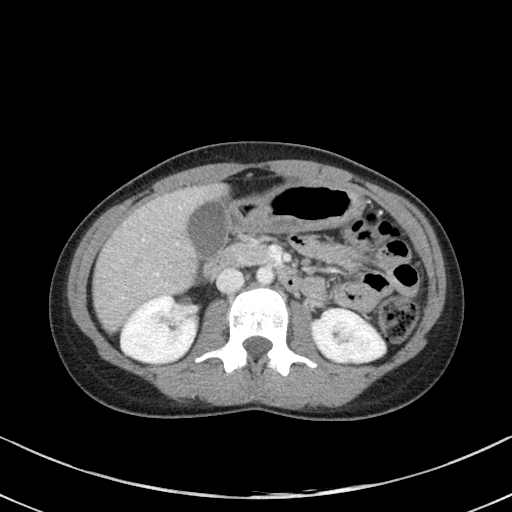
[im 60/83  soft-tissue]
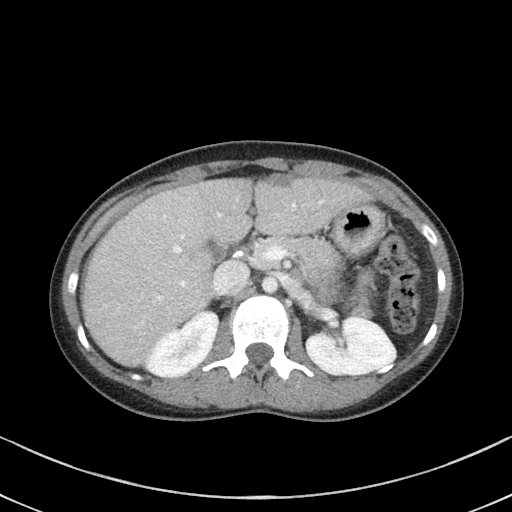
[im 60/83  bone]
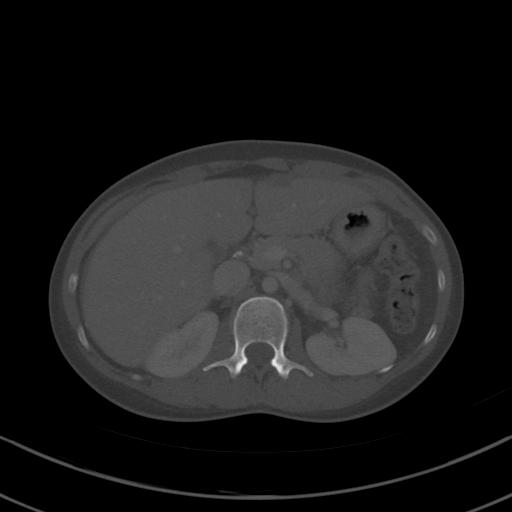
[im 66/83  soft-tissue]
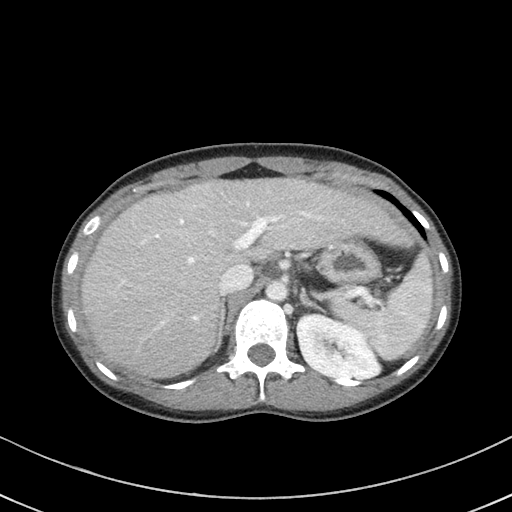
[im 73/83  soft-tissue]
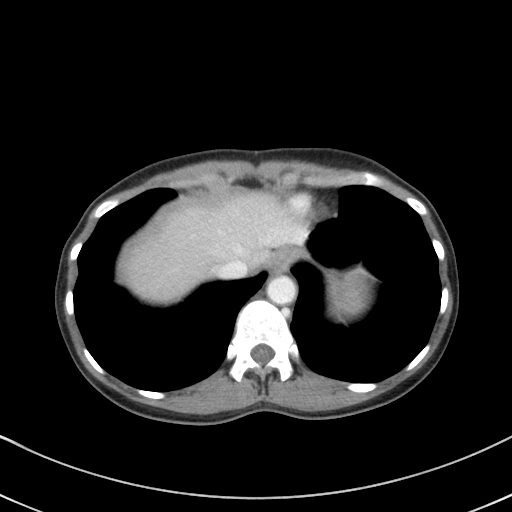
[im 79/83  soft-tissue]
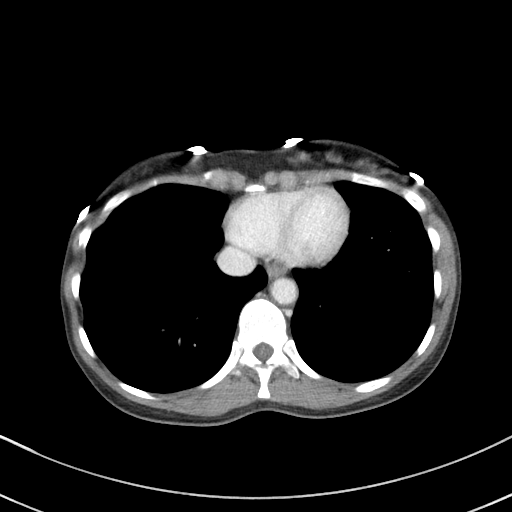

[15 of 46 positions shown; findings below may reference images not displayed]

FINDINGS: Lower chest: The lung bases are clear. No pulmonary lesions or
pleural effusions.

Hepatobiliary: No intrahepatic lesions are identified. There is a
small peripheral soft tissue nodule worrisome for a peritoneal
implant involving the left hepatic lobe and best seen on image [DATE].
The gallbladder is unremarkable. Small amount of fluid noted near
the gallbladder. No common bile duct dilatation.

Pancreas: No mass, inflammation or ductal dilatation.

Spleen: Normal size. No focal lesions. No peritoneal implants.

Adrenals/Urinary Tract: The adrenal glands and kidneys are
unremarkable. No renal lesions or hydronephrosis. The bladder
appears normal.

Stomach/Bowel: The stomach and duodenum are unremarkable.

There are some thickened appearing small bowel loops in the right
lower quadrant which appears somewhat clumped. Possible peritoneal
inflammation. No acute inflammatory changes, mass lesions or
obstructive findings. The terminal ileum is normal. The appendix is
surgically absent.

Vascular/Lymphatic: The aorta is normal in caliber. No dissection.
The branch vessels are patent. The major venous structures are
patent. No mesenteric or retroperitoneal mass or adenopathy. Small
scattered lymph nodes are noted.

Small implant noted just inferior to the left renal vein in the
retroperitoneum. A few small scattered retroperitoneal lymph nodes
are stable. There is also a small cluster of lymph nodes in the root
of the small bowel mesentery which are stable.

There is a enlarged mesenteric node versus mesenteric implant noted
on image 40/5 which measures 10 mm. This is difficult to see on the
prior study because of all the fluid.

Reproductive: The uterus and ovaries are unremarkable. There is a
rim enhancing corpus luteum cyst noted on the right side.

Other: Moderate free pelvic fluid is noted. Small amount of fluid
around the liver and in the right pericolic gutter noted.

Musculoskeletal: No significant bony findings.
IMPRESSION: 1. Much improved CT appearance of the abdomen/pelvis when compared
to the prior study from 01/31/2019. Patient with known
intra-abdominal TB.
2. Small residual peritoneal implants and borderline mesenteric and
retroperitoneal lymph nodes.

3. Small amount of free abdominal and free pelvic fluid.
4. Thickened appearing small bowel loops in the lower abdomen and
right lower quadrant possibly due to residual peritoneal
inflammation. No obstruction.
5. Resolution of pleural effusions.

## 2021-08-29 ENCOUNTER — Other Ambulatory Visit: Payer: Self-pay

## 2021-08-29 ENCOUNTER — Emergency Department (HOSPITAL_COMMUNITY)
Admission: EM | Admit: 2021-08-29 | Discharge: 2021-08-29 | Disposition: A | Discharge status: Home / Self Care | Payer: Self-pay | Attending: Emergency Medicine | Admitting: Emergency Medicine

## 2021-08-29 ENCOUNTER — Encounter (HOSPITAL_COMMUNITY): Payer: Self-pay | Admitting: Emergency Medicine

## 2021-08-29 ENCOUNTER — Emergency Department (HOSPITAL_COMMUNITY): Payer: Self-pay

## 2021-08-29 DIAGNOSIS — R002 Palpitations: Secondary | ICD-10-CM | POA: Insufficient documentation

## 2021-08-29 DIAGNOSIS — D72829 Elevated white blood cell count, unspecified: Secondary | ICD-10-CM | POA: Insufficient documentation

## 2021-08-29 DIAGNOSIS — R0789 Other chest pain: Secondary | ICD-10-CM | POA: Insufficient documentation

## 2021-08-29 DIAGNOSIS — R0602 Shortness of breath: Secondary | ICD-10-CM | POA: Insufficient documentation

## 2021-08-29 DIAGNOSIS — R051 Acute cough: Secondary | ICD-10-CM | POA: Insufficient documentation

## 2021-08-29 DIAGNOSIS — Z20822 Contact with and (suspected) exposure to covid-19: Secondary | ICD-10-CM | POA: Insufficient documentation

## 2021-08-29 LAB — CBC WITH DIFFERENTIAL/PLATELET
Abs Immature Granulocytes: 0.02 10*3/uL (ref 0.00–0.07)
Basophils Absolute: 0.1 10*3/uL (ref 0.0–0.1)
Basophils Relative: 1 %
Eosinophils Absolute: 0.3 10*3/uL (ref 0.0–0.5)
Eosinophils Relative: 3 %
HCT: 38.8 % (ref 36.0–46.0)
Hemoglobin: 12.9 g/dL (ref 12.0–15.0)
Immature Granulocytes: 0 %
Lymphocytes Relative: 23 %
Lymphs Abs: 2.6 10*3/uL (ref 0.7–4.0)
MCH: 31.2 pg (ref 26.0–34.0)
MCHC: 33.2 g/dL (ref 30.0–36.0)
MCV: 93.7 fL (ref 80.0–100.0)
Monocytes Absolute: 0.7 10*3/uL (ref 0.1–1.0)
Monocytes Relative: 6 %
Neutro Abs: 7.7 10*3/uL (ref 1.7–7.7)
Neutrophils Relative %: 67 %
Platelets: 392 10*3/uL (ref 150–400)
RBC: 4.14 MIL/uL (ref 3.87–5.11)
RDW: 13.9 % (ref 11.5–15.5)
WBC: 11.3 10*3/uL — ABNORMAL HIGH (ref 4.0–10.5)
nRBC: 0 % (ref 0.0–0.2)

## 2021-08-29 LAB — TROPONIN I (HIGH SENSITIVITY)
Troponin I (High Sensitivity): <2 ng/L (ref <18)
Troponin I (High Sensitivity): <2 ng/L (ref <18)

## 2021-08-29 LAB — BASIC METABOLIC PANEL
Anion gap: 7 (ref 5–15)
BUN: 13 mg/dL (ref 6–20)
CO2: 21 mmol/L — ABNORMAL LOW (ref 22–32)
Calcium: 8.8 mg/dL — ABNORMAL LOW (ref 8.9–10.3)
Chloride: 106 mmol/L (ref 98–111)
Creatinine, Ser: 0.58 mg/dL (ref 0.44–1.00)
GFR, Estimated: >60 mL/min (ref >60)
Glucose, Bld: 101 mg/dL — ABNORMAL HIGH (ref 70–99)
Potassium: 3.7 mmol/L (ref 3.5–5.1)
Sodium: 134 mmol/L — ABNORMAL LOW (ref 135–145)

## 2021-08-29 LAB — SARS CORONAVIRUS 2 BY RT PCR: SARS Coronavirus 2 by RT PCR: NEGATIVE

## 2021-08-29 LAB — I-STAT BETA HCG BLOOD, ED (MC, WL, AP ONLY): I-stat hCG, quantitative: <5 m[IU]/mL (ref <5)

## 2021-08-29 MED ORDER — OMEPRAZOLE 20 MG PO CPDR: 20.0000 mg | DELAYED_RELEASE_CAPSULE | Freq: Every day | ORAL | 0 refills | Status: DC

## 2021-08-29 MED ORDER — DICLOFENAC SODIUM 1 % EX GEL: 2.0000 g | Freq: Four times a day (QID) | CUTANEOUS | 0 refills | Status: DC

## 2021-08-29 NOTE — ED Triage Notes (Signed)
Patient with chest pain, shortness of breath and cough.  Patient states that it has been over two weeks.  No fevers.  Patient has not taken any OTC meds for her symptoms.

## 2021-08-29 NOTE — Discharge Instructions (Signed)
Take medications as prescribed. Call clinic to schedule follow-up appointment.  Tome los medicamentos segn lo prescrito. Llame a la clnica para programar una cita de seguimiento.

## 2021-08-29 NOTE — ED Notes (Signed)
Patient has a history of TB 

## 2021-08-29 NOTE — ED Provider Notes (Signed)
[facility] EMERGENCY DEPARTMENT Provider Note   CSN: [id number] Arrival date & time: 08/29/21  3664     History  Chief Complaint  Patient presents with   Chest Pain   Cough   Shortness of Breath    Nancy Hurst is a 31 y.o. female.  31 yo female with complaint of pain in the chest with palpitations and cough onset 2 weeks ago, intermittent at first but now constant for the past week and never goes away. SHOB at times when she has chest pain. Denies congestion, sick contacts, fevers. 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    History of TB, not currently on treatment.   Language interpreter used: tablet- spanish.  Chest Pain Associated symptoms: cough and shortness of breath   Cough Associated symptoms: chest pain and shortness of breath   Shortness of Breath Associated symptoms: chest pain and cough        Home Medications Prior to Admission medications   Medication Sig Start Date End Date Taking? Authorizing Provider  diclofenac Sodium (VOLTAREN) 1 % GEL Apply 2 g topically 4 (four) times daily. 08/29/21  Yes Jeannie Fend, PA-C  omeprazole (PRILOSEC) 20 MG capsule Take 1 capsule (20 mg total) by mouth daily. 08/29/21  Yes Jeannie Fend, PA-C  acetaminophen (TYLENOL) 500 MG tablet Take 1,000 mg by mouth every 6 (six) hours as needed for mild pain.    [provider]      Allergies    Patient has no known allergies.    Review of Systems   Review of Systems  Respiratory:  Positive for cough and shortness of breath.   Cardiovascular:  Positive for chest pain.   Negative except as per HPI Physical Exam Updated Vital Signs BP 101/73 (BP Location: Left Arm)   Pulse 65   Temp 98.1 F (36.7 C) (Oral)   Resp 15   SpO2 100%  Physical Exam Vitals and nursing note reviewed.  Constitutional:      General: She is not in acute distress.    Appearance: She  is well-developed. She is not diaphoretic.  HENT:     Head: Normocephalic and atraumatic.  Neck:     Thyroid: No thyromegaly.  Cardiovascular:     Rate and Rhythm: Normal rate and regular rhythm.     Heart sounds: Normal heart sounds.  Pulmonary:     Effort: Pulmonary effort is normal.     Breath sounds: Normal breath sounds. No decreased breath sounds.  Chest:     Chest wall: Tenderness present.    Abdominal:     Palpations: Abdomen is soft.     Tenderness: There is no abdominal tenderness.  Musculoskeletal:     Cervical back: Neck supple.     Right lower leg: No tenderness. No edema.     Left lower leg: No tenderness. No edema.  Skin:    General: Skin is warm and dry.     Findings: No ecchymosis or rash.  Neurological:     Mental Status: She is alert and oriented to person, place, and time.  Psychiatric:        Behavior: Behavior normal.     ED Results / Procedures / Treatments   Labs (all labs ordered are listed, but only abnormal results are displayed) Labs Reviewed  CBC WITH DIFFERENTIAL/PLATELET - Abnormal; Notable for the following components:      Result Value  WBC 11.3 (*)    All other components within normal limits  BASIC METABOLIC PANEL - Abnormal; Notable for the following components:   Sodium 134 (*)    CO2 21 (*)    Glucose, Bld 101 (*)    Calcium 8.8 (*)    All other components within normal limits  SARS CORONAVIRUS 2 BY RT PCR  I-STAT BETA HCG BLOOD, ED ([facility], [facility], [facility] ONLY)  TROPONIN I (HIGH SENSITIVITY)  TROPONIN I (HIGH SENSITIVITY)    EKG None  Radiology DG Chest 2 View  Result Date: 08/29/2021 CLINICAL DATA:  Cough, chest pain, and shortness of breath for 2 weeks. EXAM: CHEST - 2 VIEW COMPARISON:  11/16/2019 FINDINGS: Heart size and pulmonary vascularity are normal. Linear scarring and pulmonary nodules demonstrated in the upper lungs, unchanged since prior study. This likely represents old postinflammatory process. No developing  consolidation or edema. No pleural effusions. No pneumothorax. Mediastinal contours appear intact. IMPRESSION: 1. No evidence of active pulmonary disease. 2. Fibrosis and nodules in the upper lungs, stable since previous studies, likely postinflammatory. Electronically Signed   By: Burman Nieves M.D.   On: 08/29/2021 01:51    Procedures Procedures    Medications Ordered in ED Medications - No data to display  ED Course/ Medical Decision Making/ A&P                           Medical Decision Making Risk Prescription drug management.   This patient presents to the ED for concern of palpitations, chest pain, cough, this involves an extensive number of treatment options, and is a complaint that carries with it a high risk of complications and morbidity.  The differential diagnosis includes but not limited to arrhythmia, thyroid disorder, costochondritis, GERD   Co morbidities that complicate the patient evaluation   history of TB, treated   Additional history obtained:  External records from outside source obtained and reviewed including visit to gynecology for annual wellness exam on August 13, 2020   Lab Tests:  I Ordered, and personally interpreted labs.  The pertinent results include: hCG negative, my leukocytosis white count of 1.3.  BMP without significant findings.  COVID-negative.   Imaging Studies ordered:  I ordered imaging studies including chest x-ray I independently visualized and interpreted imaging which showed no acute process I agree with the radiologist interpretation   Cardiac Monitoring: / EKG:  The patient was maintained on a cardiac monitor.  I personally viewed and interpreted the cardiac monitored which showed an underlying rhythm of: Normal sinus rhythm, rate 74  Problem List / ED Course / Critical interventions / Medication management  31 year old female with complaint of cough x2 weeks with pain in her chest and palpitations.  She feels like she  is experiencing palpitations at this time however is found to be in a sinus rhythm with regular rate.  She does have some chest wall discomfort.  Question if she has GERD causing her cough and palpitations perceived.  Question costochondritis.  We will try omeprazole with topical Voltaren to avoid oral NSAIDs which may aggravate her GERD.  Recommend follow-up with primary care provider. I have reviewed the patients home medicines and have made adjustments as needed   Social Determinants of Health:  Has PCP   Test / Admission - Considered:  Consider add on TSH and magnesium however patient is already in the emergency room for 12 hours, these labs can be addressed through primary care follow-up  if necessary.         Final Clinical Impression(s) / ED Diagnoses Final diagnoses:  Palpitations  Acute cough    Rx / DC Orders ED Discharge Orders          Ordered    omeprazole (PRILOSEC) 20 MG capsule  Daily        08/29/21 1306    diclofenac Sodium (VOLTAREN) 1 % GEL  4 times daily        08/29/21 1306              Alden Hipp 08/29/21 Silver Huguenin, MD 08/29/21 1958

## 2021-08-29 NOTE — ED Provider Triage Note (Signed)
Emergency Medicine Provider Triage Evaluation Note  Nancy Hurst , a 31 y.o. female  was evaluated in triage.  Pt complains of chest pain, cough, and intermittent shortness of breath x2 weeks.  Denies any fever or sick contacts.  Remote hx of TB in chart, no active treatment for this.  Review of Systems  Positive: Chest pain, cough, shortness of breath Negative: fever  Physical Exam  BP 121/79   Pulse 77   Temp 98.4 F (36.9 C) (Oral)   Resp 16   SpO2 100%  Gen:   Awake, no distress   Resp:  Normal effort  MSK:   Moves extremities without difficulty  Other:  Dry cough on exam  Medical Decision Making  Medically screening exam initiated at 1:42 AM.  Appropriate orders placed.  Nancy Hurst was informed that the remainder of the evaluation will be completed by another provider, this initial triage assessment does not replace that evaluation, and the importance of remaining in the ED until their evaluation is complete.  Chest pain, cough, SOB x2 weeks. Remote hx of TB, no active treatment for such.  EKG, labs, CXR.   Garlon Hatchet, PA-C 08/29/21 [phone]

## 2021-10-30 IMAGING — US US BREAST*L* LIMITED INC AXILLA
1 series · 14 of 25 positions shown · non-contrast
Comparison: Previous exam(s).

CLINICAL DATA: Patient for 2 year follow-up of probably benign
masses within the left breast.

EXAM:
ULTRASOUND OF THE LEFT BREAST

[Series 1: us breast*left* limited inc axilla · 0.06mm/px · 14 of 31 slices shown]
[im 1/31]
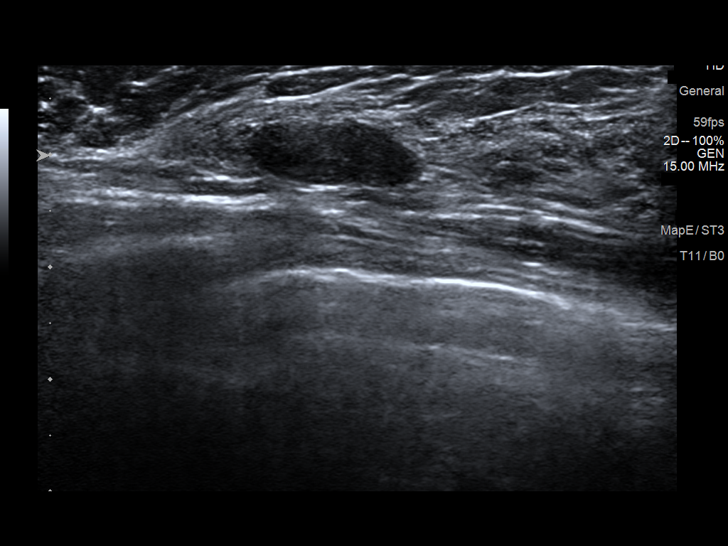
[im 3/31]
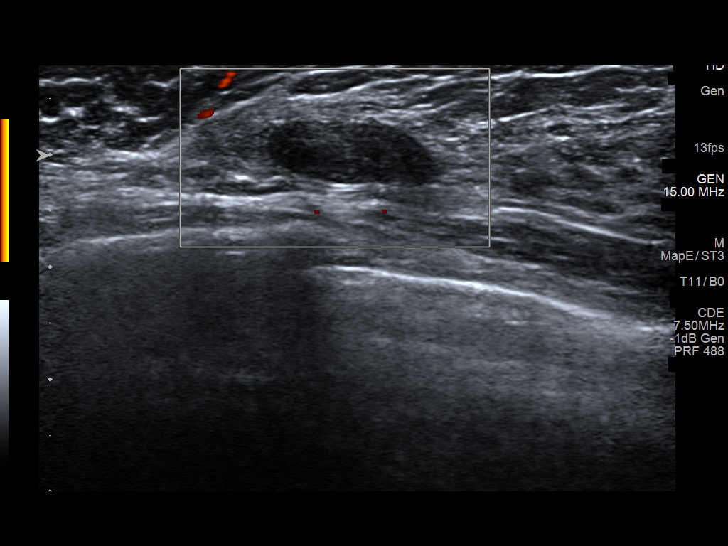
[im 6/31]
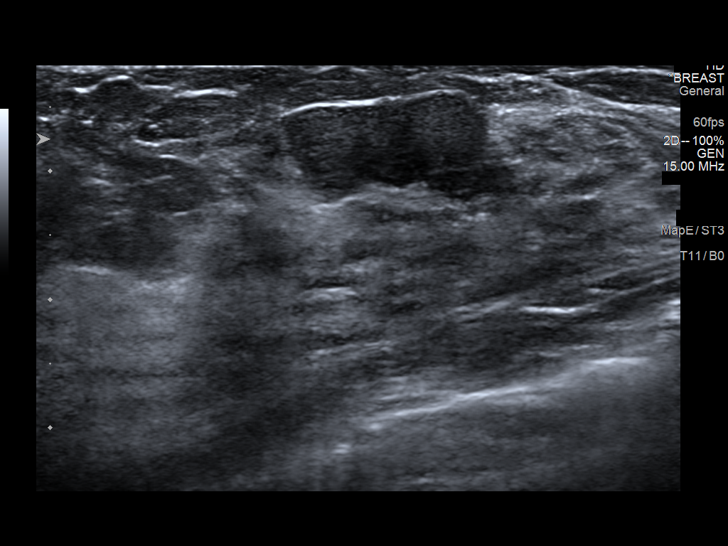
[im 8/31]
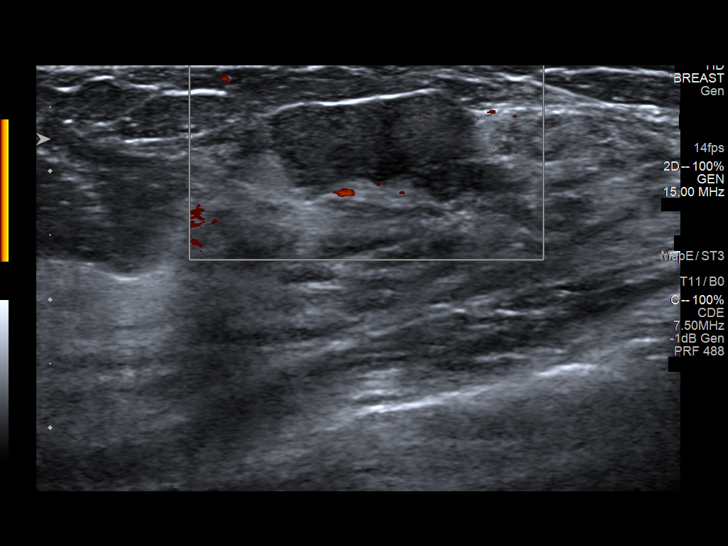
[im 11/31]
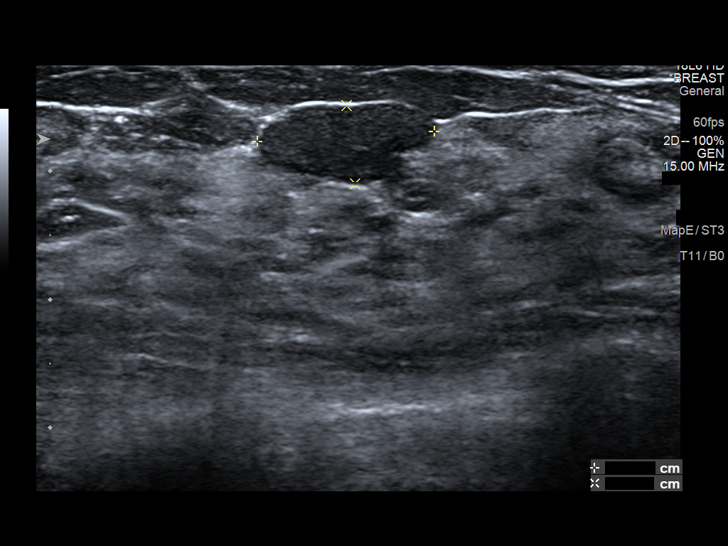
[im 12/31]
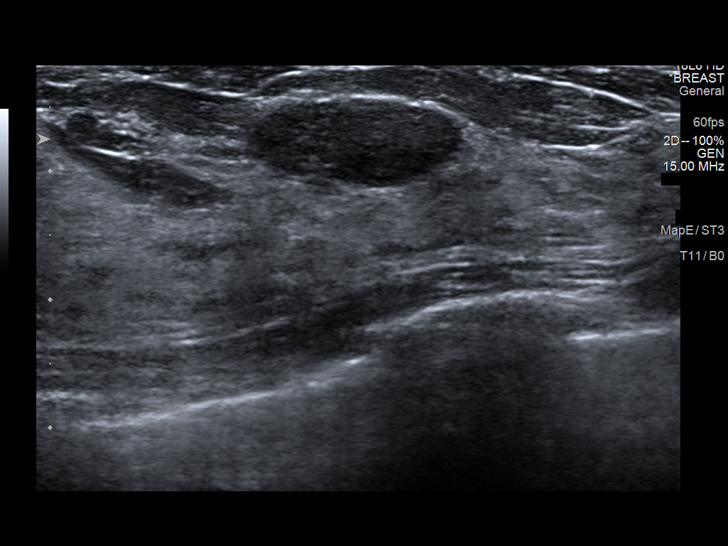
[im 14/31]
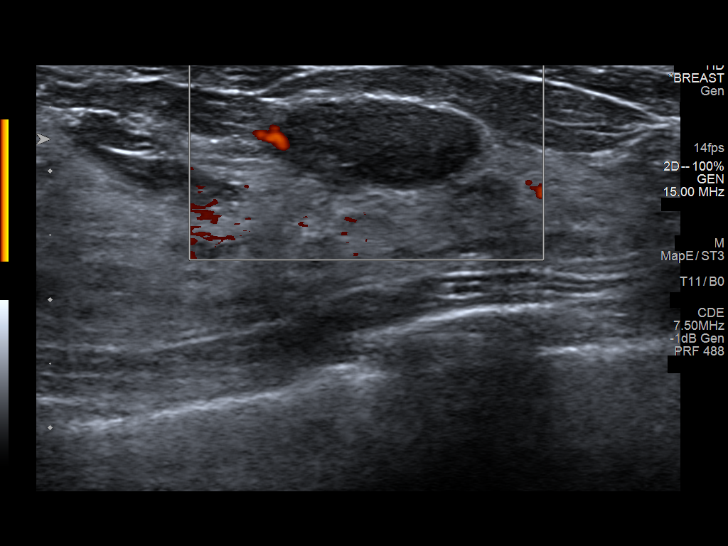
[im 17/31]
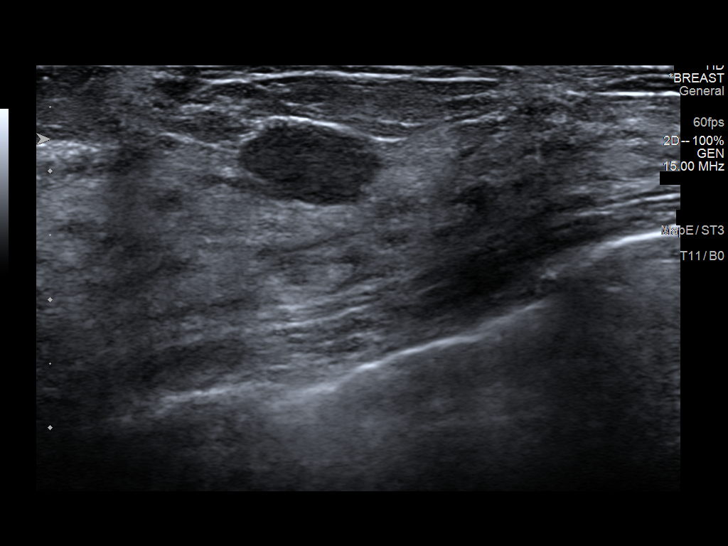
[im 19/31]
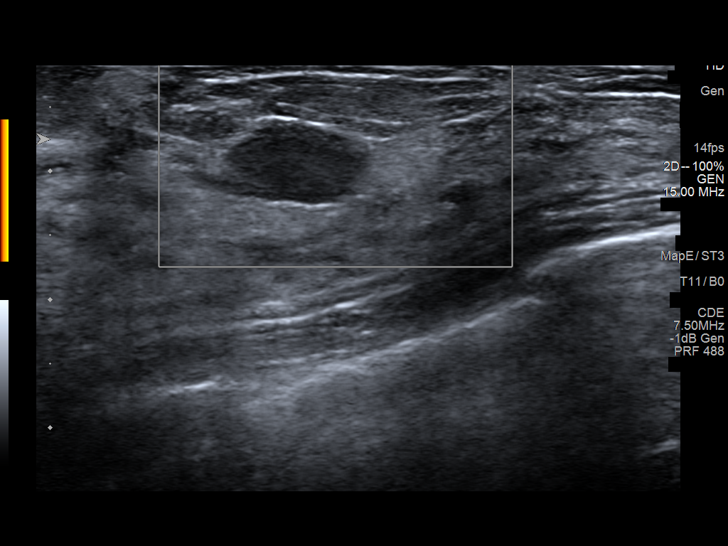
[im 21/31]
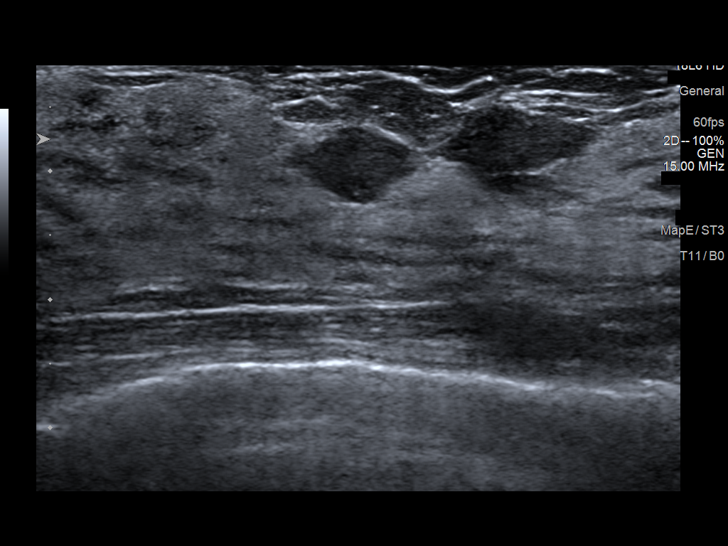
[im 23/31]
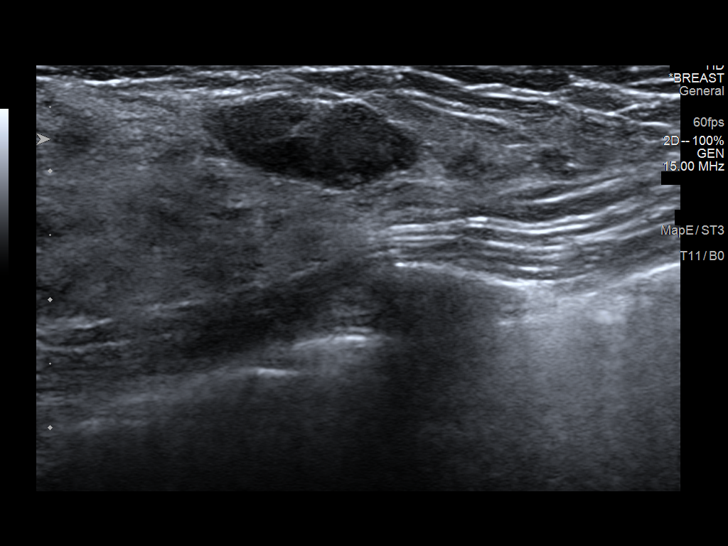
[im 26/31]
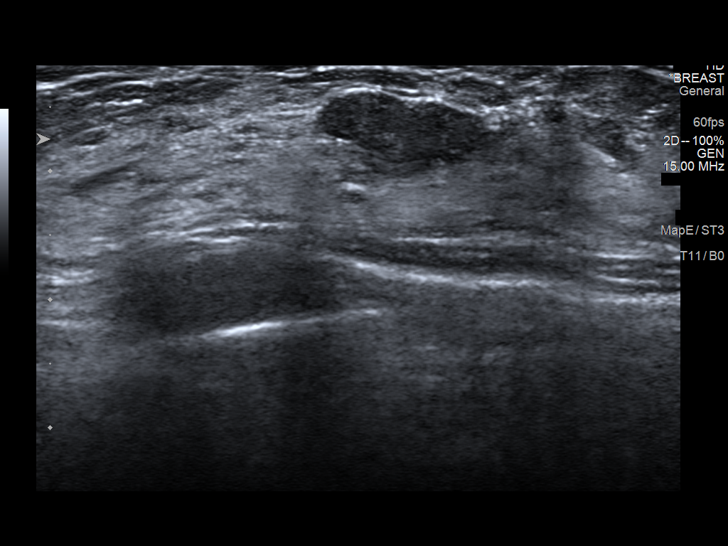
[im 28/31]
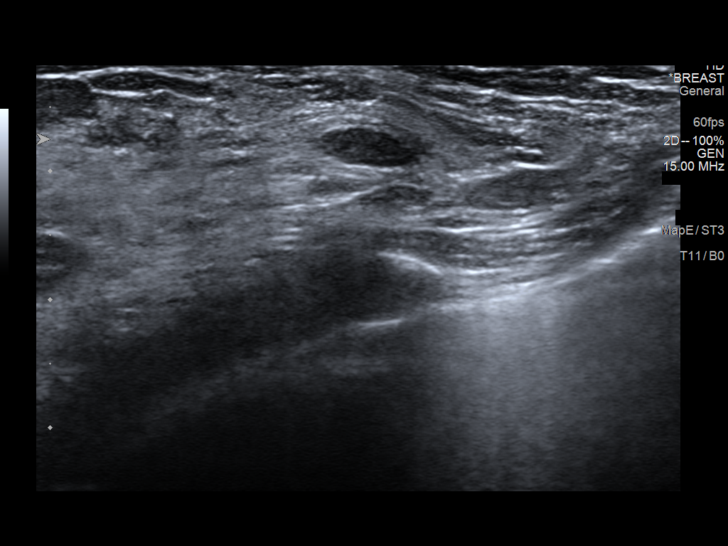
[im 31/31]
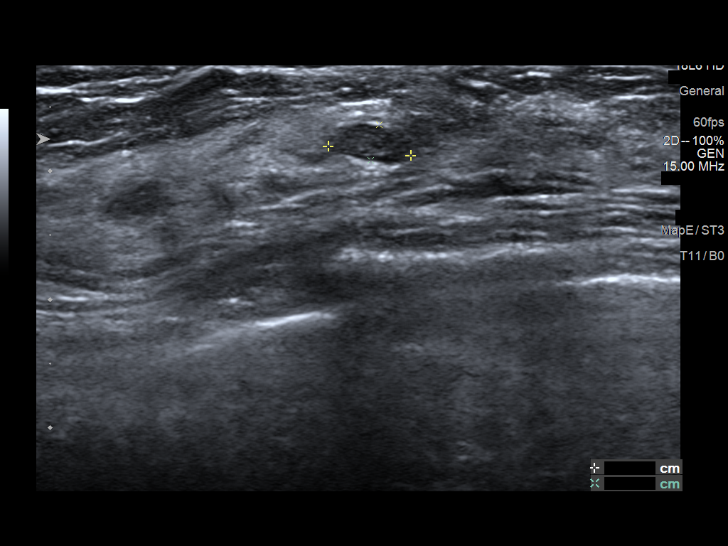

[14 of 25 positions shown; findings below may reference images not displayed]

FINDINGS: Targeted ultrasound is performed, showing stable masses within the
left breast when compared to prior exams.

There is a 16 x 11 x 5 mm mass left breast 2 o'clock position 8 cm
from nipple.

A 16 x 14 x 6 mm mass left breast 3 o'clock position 4 cm from
nipple.

A 16 x 12 x 7 mm mass left breast 4 o'clock position 2 cm from
nipple.

A 9 x 6 x 11 mm left breast mass 4:30 o'clock retroareolar location.

A 16 x 13 x 6 mm mass left breast 5 o'clock position 3 cm from
nipple.

A 7 x 7 x 3 mm mass left breast 5 o'clock position 3 cm from nipple.
IMPRESSION: Left breast masses most compatible with fibroadenomas given
stability over time.

RECOMMENDATION:
Annual screening mammography at age 40.

I have discussed the findings and recommendations with the patient.
If applicable, a reminder letter will be sent to the patient
regarding the next appointment.

BI-RADS CATEGORY  2: Benign.

## 2022-08-02 ENCOUNTER — Emergency Department (HOSPITAL_COMMUNITY): Payer: Self-pay

## 2022-08-02 ENCOUNTER — Other Ambulatory Visit: Payer: Self-pay

## 2022-08-02 ENCOUNTER — Emergency Department (HOSPITAL_COMMUNITY)
Admission: EM | Admit: 2022-08-02 | Discharge: 2022-08-03 | Disposition: A | Discharge status: Home / Self Care | Payer: Self-pay | Attending: Emergency Medicine | Admitting: Emergency Medicine

## 2022-08-02 DIAGNOSIS — F419 Anxiety disorder, unspecified: Secondary | ICD-10-CM | POA: Insufficient documentation

## 2022-08-02 DIAGNOSIS — R002 Palpitations: Secondary | ICD-10-CM | POA: Insufficient documentation

## 2022-08-02 LAB — CBC
HCT: 40 % (ref 36.0–46.0)
Hemoglobin: 13.8 g/dL (ref 12.0–15.0)
MCH: 32 pg (ref 26.0–34.0)
MCHC: 34.5 g/dL (ref 30.0–36.0)
MCV: 92.8 fL (ref 80.0–100.0)
Platelets: 359 10*3/uL (ref 150–400)
RBC: 4.31 MIL/uL (ref 3.87–5.11)
RDW: 14.3 % (ref 11.5–15.5)
WBC: 6.8 10*3/uL (ref 4.0–10.5)
nRBC: 0 % (ref 0.0–0.2)

## 2022-08-02 LAB — TSH: TSH: 0.489 u[IU]/mL (ref 0.350–4.500)

## 2022-08-02 LAB — BASIC METABOLIC PANEL
Anion gap: 16 — ABNORMAL HIGH (ref 5–15)
BUN: 10 mg/dL (ref 6–20)
CO2: 18 mmol/L — ABNORMAL LOW (ref 22–32)
Calcium: 9.3 mg/dL (ref 8.9–10.3)
Chloride: 103 mmol/L (ref 98–111)
Creatinine, Ser: 0.6 mg/dL (ref 0.44–1.00)
GFR, Estimated: >60 mL/min (ref >60)
Glucose, Bld: 158 mg/dL — ABNORMAL HIGH (ref 70–99)
Potassium: 3.5 mmol/L (ref 3.5–5.1)
Sodium: 137 mmol/L (ref 135–145)

## 2022-08-02 LAB — TROPONIN I (HIGH SENSITIVITY)
Troponin I (High Sensitivity): <2 ng/L (ref <18)
Troponin I (High Sensitivity): <2 ng/L (ref <18)

## 2022-08-02 LAB — I-STAT BETA HCG BLOOD, ED (MC, WL, AP ONLY): I-stat hCG, quantitative: <5 m[IU]/mL (ref <5)

## 2022-08-02 NOTE — ED Triage Notes (Signed)
Pt states she is having anxiety and "heart palpitations" since last night. States she went to a "[place] clinic" this morning for same and was given a "shot" that she doesn't know what it was and has no paperwork documenting it.  Pt states since the shot she has felt worse.

## 2022-08-02 NOTE — ED Provider Triage Note (Signed)
Emergency Medicine Provider Triage Evaluation Note  Lasha Echeverria , a 32 y.o. female  was evaluated in triage.  Pt complains of anxiety and palpitations since yesterday.  Patient reports that she went to a "[place] clinic" and received an injection into her buttocks but does not know what it was.  She reports that she was worse after she received the injection.  Reports palpitations and tremors with denies any chest pain or shortness of breath.  Reports a history of anxiety.  Review of Systems  Positive:  Negative:   Physical Exam  BP 123/85 (BP Location: Right Arm)   Pulse 90   Temp 98.5 F (36.9 C) (Oral)   Resp 19   SpO2 100%  Gen:   Awake, tremulous, appears anxious Resp:  Normal effort  MSK:   Moves extremities without difficulty  Other:    Medical Decision Making  Medically screening exam initiated at 8:18 PM.  Appropriate orders placed.  [name] was informed that the remainder of the evaluation will be completed by another provider, this initial triage assessment does not replace that evaluation, and the importance of remaining in the ED until their evaluation is complete.  Labs ordered.  I do not see any previous information on the clinic she was seen at earlier.  She was not given a discharge paperwork.   Achille Rich, PA-C 08/02/22 2020

## 2022-08-03 ENCOUNTER — Encounter (HOSPITAL_COMMUNITY): Payer: Self-pay

## 2022-08-03 ENCOUNTER — Other Ambulatory Visit: Payer: Self-pay

## 2022-08-03 ENCOUNTER — Emergency Department (HOSPITAL_COMMUNITY)
Admission: EM | Admit: 2022-08-03 | Discharge: 2022-08-04 | Disposition: A | Discharge status: Home / Self Care | Payer: Self-pay | Attending: Emergency Medicine | Admitting: Emergency Medicine

## 2022-08-03 DIAGNOSIS — K219 Gastro-esophageal reflux disease without esophagitis: Secondary | ICD-10-CM | POA: Insufficient documentation

## 2022-08-03 DIAGNOSIS — F419 Anxiety disorder, unspecified: Secondary | ICD-10-CM | POA: Insufficient documentation

## 2022-08-03 DIAGNOSIS — R002 Palpitations: Secondary | ICD-10-CM | POA: Insufficient documentation

## 2022-08-03 MED ORDER — HYDROXYZINE HCL 25 MG PO TABS
25.0000 mg | ORAL_TABLET | Freq: Once | ORAL | Status: AC
  Administered 2022-08-03: 25 mg via ORAL
  Filled 2022-08-03: qty 1

## 2022-08-03 NOTE — ED Notes (Signed)
Pt verbalized understanding of discharge instructions. Pt ambulated from ed with steady gait family to drive home

## 2022-08-03 NOTE — ED Provider Notes (Signed)
[place] EMERGENCY DEPARTMENT AT [facility] Provider Note   CSN: [id number] Arrival date & time: 08/02/22  1934     History  Chief Complaint  Patient presents with   Anxiety    Nancy Hurst is a 32 y.o. female.  HPI     This is a 32 year old female who presents with palpitations.  Patient reports she has had ongoing palpitations that have worsened.  She was seen in clinic yesterday and "given a shot."  She cannot tell me what she got but feels like the shot worsened her palpitations.  Patient states that she was given a shot "to help me sleep."  She denies any recent changes in diet, medications, alcohol or drug use.  No history of thyroid disorder.  Reported history of anxiety to triage but downplays that to me.  Denies chest pain, shortness of breath, recent illnesses.  Spanish interpreter  Home Medications Prior to Admission medications   Medication Sig Start Date End Date Taking? Authorizing Provider  acetaminophen (TYLENOL) 500 MG tablet Take 1,000 mg by mouth every 6 (six) hours as needed for mild pain.    [provider]  diclofenac Sodium (VOLTAREN) 1 % GEL Apply 2 g topically 4 (four) times daily. 08/29/21   Jeannie Fend, PA-C  omeprazole (PRILOSEC) 20 MG capsule Take 1 capsule (20 mg total) by mouth daily. 08/29/21   Jeannie Fend, PA-C      Allergies    Patient has no known allergies.    Review of Systems   Review of Systems  Respiratory:  Negative for shortness of breath.   Cardiovascular:  Positive for palpitations. Negative for chest pain.  All other systems reviewed and are negative.   Physical Exam Updated Vital Signs BP 101/62 (BP Location: Right Arm)   Pulse 78   Temp 98.2 F (36.8 C) (Oral)   Resp 16   SpO2 100%  Physical Exam Vitals and nursing note reviewed.  Constitutional:      Appearance: She is well-developed.  HENT:     Head: Normocephalic and atraumatic.  Eyes:     Pupils: Pupils are equal,  round, and reactive to light.  Cardiovascular:     Rate and Rhythm: Normal rate and regular rhythm.     Heart sounds: Normal heart sounds.  Pulmonary:     Effort: Pulmonary effort is normal. No respiratory distress.     Breath sounds: No wheezing.  Abdominal:     General: Bowel sounds are normal.     Palpations: Abdomen is soft.  Musculoskeletal:     Cervical back: Neck supple.  Skin:    General: Skin is warm and dry.  Neurological:     Mental Status: She is alert and oriented to person, place, and time.  Psychiatric:        Mood and Affect: Mood normal.     ED Results / Procedures / Treatments   Labs (all labs ordered are listed, but only abnormal results are displayed) Labs Reviewed  BASIC METABOLIC PANEL - Abnormal; Notable for the following components:      Result Value   CO2 18 (*)    Glucose, Bld 158 (*)    Anion gap 16 (*)    All other components within normal limits  CBC  TSH  I-STAT BETA HCG BLOOD, ED ([facility], [facility], [facility] ONLY)  TROPONIN I (HIGH SENSITIVITY)  TROPONIN I (HIGH SENSITIVITY)    EKG EKG Interpretation  Date/Time:  Tuesday August 03 2022  03:52:11 EDT Ventricular Rate:  74 PR Interval:  150 QRS Duration: 74 QT Interval:  384 QTC Calculation: 426 R Axis:   93 Text Interpretation: Normal sinus rhythm Rightward axis Borderline ECG When compared with ECG of 29-Aug-2021 01:08, PREVIOUS ECG IS PRESENT Confirmed by Ross Marcus ([id number]) on 08/03/2022 4:06:40 AM  Radiology DG Chest 2 View  Result Date: 08/02/2022 CLINICAL DATA:  Anxiety and palpitations. EXAM: CHEST - 2 VIEW COMPARISON:  August 29, 2021 FINDINGS: The heart size and mediastinal contours are within normal limits. The lungs are hyperinflated. Multiple small, stable pulmonary nodules and stable areas of scarring are seen within the bilateral upper lobes. There is no evidence of an acute infiltrate, pleural effusion or pneumothorax. The visualized skeletal structures are unremarkable. IMPRESSION:  Chronic changes without evidence of acute or active cardiopulmonary disease. Electronically Signed   By: Aram Candela M.D.   On: 08/02/2022 20:35    Procedures Procedures    Medications Ordered in ED Medications - No data to display  ED Course/ Medical Decision Making/ A&P                             Medical Decision Making  This patient presents to the ED for concern of palpitations, this involves an extensive number of treatment options, and is a complaint that carries with it a high risk of complications and morbidity.  I considered the following differential and admission for this acute, potentially life threatening condition.  The differential diagnosis includes arrhythmia, metabolic derangement, thyroid disturbance, intoxication, anxiety  MDM:    This is a 32 year old otherwise healthy female who presents with persistent palpitations.  She is nontoxic and vital signs are reassuring.  She is afebrile.  EKG here is sinus rhythm without obvious acute ischemia or arrhythmia.  She does not appear to have significant ectopy.  Labs obtained and reviewed.  TSH normal.  Troponin x 2 normal.  No significant metabolic derangements.  Overall workup is reassuring.  Low suspicion for PE and is PERC neg. will have her follow-up with cardiology for Holter monitoring.  Patient is agreeable to plan.  (Labs, imaging, consults)  Labs: I Ordered, and personally interpreted labs.  The pertinent results include: CBC, BMP, beta-hCG, troponin  Imaging Studies ordered: I ordered imaging studies including chest x-ray I independently visualized and interpreted imaging. I agree with the radiologist interpretation  Additional history obtained from chart review.  External records from outside source obtained and reviewed including prior evaluations  Cardiac Monitoring: The patient was maintained on a cardiac monitor.  If on the cardiac monitor, I personally viewed and interpreted the cardiac monitored  which showed an underlying rhythm of: Sinus rhythm  Reevaluation: After the interventions noted above, I reevaluated the patient and found that they have :stayed the same  Social Determinants of Health:  lives independently  Disposition: Discharge  Co morbidities that complicate the patient evaluation  Past Medical History:  Diagnosis Date   Infertility, female    Tuberculosis      Medicines No orders of the defined types were placed in this encounter.   I have reviewed the patients home medicines and have made adjustments as needed  Problem List / ED Course: Problem List Items Addressed This Visit   None Visit Diagnoses     Palpitations    -  Primary   Relevant Orders   Ambulatory referral to Cardiology  Final Clinical Impression(s) / ED Diagnoses Final diagnoses:  Palpitations    Rx / DC Orders ED Discharge Orders          Ordered    Ambulatory referral to Cardiology        08/03/22 0407              Shon Baton, MD 08/03/22 [phone]

## 2022-08-03 NOTE — ED Triage Notes (Signed)
Pt seen last night for same.  Pt reports she began taking amoxicillin today and atarax but states she is even worse than yesterday.  Pt shaking and anxious during triage.

## 2022-08-03 NOTE — Discharge Instructions (Signed)
You were seen today for palpitations.  Follow-up with cardiology.  Your workup today is largely reassuring.

## 2022-08-04 ENCOUNTER — Other Ambulatory Visit: Payer: Self-pay

## 2022-08-04 ENCOUNTER — Emergency Department (HOSPITAL_COMMUNITY)
Admission: EM | Admit: 2022-08-04 | Discharge: 2022-08-05 | Disposition: A | Discharge status: Other Acute Inpatient Hospital | Payer: Self-pay | Attending: Emergency Medicine | Admitting: Emergency Medicine

## 2022-08-04 ENCOUNTER — Encounter (HOSPITAL_COMMUNITY): Payer: Self-pay

## 2022-08-04 DIAGNOSIS — F411 Generalized anxiety disorder: Secondary | ICD-10-CM

## 2022-08-04 DIAGNOSIS — Z79899 Other long term (current) drug therapy: Secondary | ICD-10-CM | POA: Insufficient documentation

## 2022-08-04 DIAGNOSIS — F419 Anxiety disorder, unspecified: Secondary | ICD-10-CM | POA: Insufficient documentation

## 2022-08-04 DIAGNOSIS — R531 Weakness: Secondary | ICD-10-CM | POA: Insufficient documentation

## 2022-08-04 DIAGNOSIS — R070 Pain in throat: Secondary | ICD-10-CM | POA: Insufficient documentation

## 2022-08-04 DIAGNOSIS — R45851 Suicidal ideations: Secondary | ICD-10-CM | POA: Insufficient documentation

## 2022-08-04 LAB — RAPID URINE DRUG SCREEN, HOSP PERFORMED
Amphetamines: NOT DETECTED
Barbiturates: NOT DETECTED
Benzodiazepines: NOT DETECTED
Cocaine: NOT DETECTED
Opiates: NOT DETECTED
Tetrahydrocannabinol: NOT DETECTED

## 2022-08-04 LAB — COMPREHENSIVE METABOLIC PANEL
ALT: 15 U/L (ref 0–44)
AST: 17 U/L (ref 15–41)
Albumin: 4.7 g/dL (ref 3.5–5.0)
Alkaline Phosphatase: 41 U/L (ref 38–126)
Anion gap: 11 (ref 5–15)
BUN: 14 mg/dL (ref 6–20)
CO2: 22 mmol/L (ref 22–32)
Calcium: 9.2 mg/dL (ref 8.9–10.3)
Chloride: 106 mmol/L (ref 98–111)
Creatinine, Ser: 0.59 mg/dL (ref 0.44–1.00)
GFR, Estimated: >60 mL/min (ref >60)
Glucose, Bld: 115 mg/dL — ABNORMAL HIGH (ref 70–99)
Potassium: 3.3 mmol/L — ABNORMAL LOW (ref 3.5–5.1)
Sodium: 139 mmol/L (ref 135–145)
Total Bilirubin: 0.8 mg/dL (ref 0.3–1.2)
Total Protein: 8.7 g/dL — ABNORMAL HIGH (ref 6.5–8.1)

## 2022-08-04 LAB — CBC WITH DIFFERENTIAL/PLATELET
Abs Immature Granulocytes: 0.01 10*3/uL (ref 0.00–0.07)
Basophils Absolute: 0 10*3/uL (ref 0.0–0.1)
Basophils Relative: 0 %
Eosinophils Absolute: 0 10*3/uL (ref 0.0–0.5)
Eosinophils Relative: 0 %
HCT: 39.1 % (ref 36.0–46.0)
Hemoglobin: 13.1 g/dL (ref 12.0–15.0)
Immature Granulocytes: 0 %
Lymphocytes Relative: 33 %
Lymphs Abs: 2 10*3/uL (ref 0.7–4.0)
MCH: 31.4 pg (ref 26.0–34.0)
MCHC: 33.5 g/dL (ref 30.0–36.0)
MCV: 93.8 fL (ref 80.0–100.0)
Monocytes Absolute: 0.4 10*3/uL (ref 0.1–1.0)
Monocytes Relative: 7 %
Neutro Abs: 3.7 10*3/uL (ref 1.7–7.7)
Neutrophils Relative %: 60 %
Platelets: 369 10*3/uL (ref 150–400)
RBC: 4.17 MIL/uL (ref 3.87–5.11)
RDW: 14.4 % (ref 11.5–15.5)
WBC: 6.1 10*3/uL (ref 4.0–10.5)
nRBC: 0 % (ref 0.0–0.2)

## 2022-08-04 LAB — HCG, SERUM, QUALITATIVE: Preg, Serum: NEGATIVE

## 2022-08-04 LAB — ETHANOL: Alcohol, Ethyl (B): <10 mg/dL (ref <10)

## 2022-08-04 MED ORDER — FLUOXETINE HCL 10 MG PO CAPS
10.0000 mg | ORAL_CAPSULE | Freq: Every day | ORAL | Status: DC
  Administered 2022-08-04 – 2022-08-05 (×2): 10 mg via ORAL
  Filled 2022-08-04 (×2): qty 1

## 2022-08-04 MED ORDER — LORAZEPAM 1 MG PO TABS
1.0000 mg | ORAL_TABLET | Freq: Two times a day (BID) | ORAL | Status: DC | PRN
  Administered 2022-08-04: 1 mg via ORAL
  Filled 2022-08-04: qty 1

## 2022-08-04 MED ORDER — ALUM & MAG HYDROXIDE-SIMETH 200-200-20 MG/5ML PO SUSP
30.0000 mL | Freq: Once | ORAL | Status: AC
  Administered 2022-08-04: 30 mL via ORAL
  Filled 2022-08-04: qty 30

## 2022-08-04 MED ORDER — LIDOCAINE VISCOUS HCL 2 % MT SOLN
15.0000 mL | Freq: Once | OROMUCOSAL | Status: AC
  Administered 2022-08-04: 15 mL via ORAL
  Filled 2022-08-04: qty 15

## 2022-08-04 MED ORDER — OMEPRAZOLE 20 MG PO CPDR: 20.0000 mg | DELAYED_RELEASE_CAPSULE | Freq: Every day | ORAL | 0 refills | Status: DC

## 2022-08-04 MED ORDER — LORAZEPAM 0.5 MG PO TABS
0.5000 mg | ORAL_TABLET | Freq: Once | ORAL | Status: AC
  Administered 2022-08-04: 0.5 mg via ORAL
  Filled 2022-08-04: qty 1

## 2022-08-04 MED ORDER — TRAZODONE HCL 100 MG PO TABS
50.0000 mg | ORAL_TABLET | Freq: Every day | ORAL | Status: DC
  Administered 2022-08-04: 50 mg via ORAL
  Filled 2022-08-04: qty 1

## 2022-08-04 MED ORDER — ACETAMINOPHEN 325 MG PO TABS
650.0000 mg | ORAL_TABLET | Freq: Once | ORAL | Status: AC
  Administered 2022-08-04: 650 mg via ORAL
  Filled 2022-08-04: qty 2

## 2022-08-04 NOTE — ED Notes (Addendum)
Pt has been dressed out into burgundy scrubs and belongings (clothing and slippers) have been collected and placed in the hall C cabinet. Pts visitor has the pt purse as well as her earrings.

## 2022-08-04 NOTE — ED Provider Notes (Signed)
[place] EMERGENCY DEPARTMENT AT [facility] Provider Note  CSN: [id number] Arrival date & time: 08/03/22 2255  Chief Complaint(s) Anxiety  HPI Nancy Hurst is a 32 y.o. female here for recurrent palpitations described as intermittent squeezing in the left chest.  Lasting seconds at a time.  No alleviating or aggravating factors.  Because of this patient feels very anxious.  Denies any shortness of breath.  Patient was seen yesterday in the emergency department for the same and had reassuring and negative cardiac workup.  Negative TSH.  Chest x-ray unchanged. States that the medicine given to her yesterday did not help.   Anxiety    Past Medical History Past Medical History:  Diagnosis Date   Infertility, female    Tuberculosis    Patient Active Problem List   Diagnosis Date Noted   Tuberculosis, urogenital 03/08/2019   Ovarian mass 02/21/2019   S/P thoracentesis    Disseminated tuberculosis    Observation for suspected tuberculosis (TB)    Normocytic anemia 01/27/2019   Moderate protein-calorie malnutrition (HCC) 01/27/2019   Fever 01/27/2019   Biological false positive RPR test 01/27/2019   Pleural effusion on left 01/24/2019   Abdominal ascites 01/24/2019   Recurrent UTI    Perforated appendicitis 11/03/2017   Home Medication(s) Prior to Admission medications   Medication Sig Start Date End Date Taking? Authorizing Provider  acetaminophen (TYLENOL) 500 MG tablet Take 1,000 mg by mouth every 6 (six) hours as needed for mild pain.    [provider]  diclofenac Sodium (VOLTAREN) 1 % GEL Apply 2 g topically 4 (four) times daily. 08/29/21   Jeannie Fend, PA-C  omeprazole (PRILOSEC) 20 MG capsule Take 1 capsule (20 mg total) by mouth daily. 08/04/22   Nira Conn, MD                                                                                                                                    Allergies Patient has no  known allergies.  Review of Systems Review of Systems As noted in HPI  Physical Exam Vital Signs  I have reviewed the triage vital signs BP 125/86 (BP Location: Right Arm)   Pulse 75   Temp 98.2 F (36.8 C) (Oral)   Resp 16   SpO2 99%   Physical Exam Vitals reviewed.  Constitutional:      General: She is not in acute distress.    Appearance: She is well-developed. She is not diaphoretic.  HENT:     Head: Normocephalic and atraumatic.     Nose: Nose normal.  Eyes:     General: No scleral icterus.       Right eye: No discharge.        Left eye: No discharge.     Conjunctiva/sclera: Conjunctivae normal.     Pupils: Pupils are equal, round, and reactive to light.  Cardiovascular:     Rate  and Rhythm: Normal rate and regular rhythm.     Heart sounds: No murmur heard.    No friction rub. No gallop.  Pulmonary:     Effort: Pulmonary effort is normal. No respiratory distress.     Breath sounds: Normal breath sounds. No stridor. No rales.  Abdominal:     General: There is no distension.     Palpations: Abdomen is soft.     Tenderness: There is no abdominal tenderness.  Musculoskeletal:        General: No tenderness.     Cervical back: Normal range of motion and neck supple.  Skin:    General: Skin is warm and dry.     Findings: No erythema or rash.  Neurological:     Mental Status: She is alert and oriented to person, place, and time.     ED Results and Treatments Labs (all labs ordered are listed, but only abnormal results are displayed) Labs Reviewed - No data to display                                                                                                                       EKG  EKG Interpretation  Date/Time:  Tuesday August 03 2022 23:06:15 EDT Ventricular Rate:  78 PR Interval:  136 QRS Duration: 68 QT Interval:  374 QTC Calculation: 426 R Axis:   96 Text Interpretation: Normal sinus rhythm Rightward axis Borderline ECG When compared with ECG of  03-Aug-2022 03:52, PREVIOUS ECG IS PRESENT Confirmed by Drema Pry [id number]) on 08/04/2022 5:37:30 AM       Radiology No results found.  Medications Ordered in ED Medications  alum & mag hydroxide-simeth (MAALOX/MYLANTA) 200-200-20 MG/5ML suspension 30 mL (30 mLs Oral Given 08/04/22 0556)    And  lidocaine (XYLOCAINE) 2 % viscous mouth solution 15 mL (15 mLs Oral Given 08/04/22 0556)  acetaminophen (TYLENOL) tablet 650 mg (650 mg Oral Given 08/04/22 1914)   Procedures Procedures  (including critical care time) Medical Decision Making / ED Course   Medical Decision Making Amount and/or Complexity of Data Reviewed ECG/medicine tests: ordered and independent interpretation performed. Decision-making details documented in ED Course.  Risk OTC drugs. Prescription drug management.    Patient presented for palpitations Seen yesterday for the same and had reassuring workup. No need for repeat work up. Patient was having palpitations during my evaluation.  I did not notice any dysrhythmias during the exam.  No murmurs during the palpitations concerning for prolapse.  Patient was treated with GI comfortable for possible esophageal spasms.  Patient reported significant relief following this.  Recommended treatment with PPIs Recommended she establish care with a primary care provider.    Final Clinical Impression(s) / ED Diagnoses Final diagnoses:  Anxiety  Palpitations  Gastroesophageal reflux disease, unspecified whether esophagitis present   The patient appears reasonably screened and/or stabilized for discharge and I doubt any other medical condition or other [facility] requiring further screening, evaluation, or treatment in the ED at this  time. I have discussed the findings, Dx and Tx plan with the patient/family who expressed understanding and agree(s) with the plan. Discharge instructions discussed at length. The patient/family was given strict return precautions who verbalized  understanding of the instructions. No further questions at time of discharge.  Disposition: Discharge  Condition: Good  ED Discharge Orders          Ordered    omeprazole (PRILOSEC) 20 MG capsule  Daily        08/04/22 0650               This chart was dictated using voice recognition software.  Despite best efforts to proofread,  errors can occur which can change the documentation meaning.    Nira Conn, MD 08/04/22 [phone]

## 2022-08-04 NOTE — Progress Notes (Signed)
Pt was under review at [facility] TODAY 08/04/2022 PENDING signed voluntary consent faxed to [facility] [phone]  Pt meets inpatient criteria per Joaquim Nam  Attending Physician will be Dr. Phineas Inches, MD  Report can be called to: -Adult unit: [phone]  Pt can arrive after: Upon completing of PENDING item [facility] will coordinate with care team.  Care Team notified: Night [facility] Zachary George. [facility], LCSWA 08/04/2022 @ 10:36 PM

## 2022-08-04 NOTE — ED Notes (Signed)
Pt has two bags they were put in patient belongings cabinet 23-25

## 2022-08-04 NOTE — ED Provider Notes (Signed)
[place] EMERGENCY DEPARTMENT AT [facility] Provider Note   CSN: [id number] Arrival date & time: 08/04/22  1330     History  Chief Complaint  Patient presents with   Anxiety   HPI Nancy Hurst is a 32 y.o. female presenting for anxiety.  Started Sunday.  Stated she felt nervous and anxious and had feelings that she was going to die.  She felt tightness in her throat pain and weakness in her legs.  Also endorsed suicidal ideation but denies homicidal ideation and hallucinations.  Denies chest pain shortness of breath.   Anxiety       Home Medications Prior to Admission medications   Medication Sig Start Date End Date Taking? Authorizing Provider  acetaminophen (TYLENOL) 500 MG tablet Take 500-1,000 mg by mouth every 6 (six) hours as needed for headache or mild pain.   Yes [provider]  amoxicillin (AMOXIL) 500 MG capsule Take 500 mg by mouth 3 (three) times daily. 08/03/22 08/10/22 Yes [provider]  UNKNOWN TO PATIENT Inject 1 Syringe into the skin See admin instructions. Unknown to patient- Inject one syringe into the skin once "as needed for anxiety"   Yes [provider]  diclofenac Sodium (VOLTAREN) 1 % GEL Apply 2 g topically 4 (four) times daily. Patient not taking: Reported on 08/04/2022 08/29/21   Jeannie Fend, PA-C  omeprazole (PRILOSEC) 20 MG capsule Take 1 capsule (20 mg total) by mouth daily. Patient not taking: Reported on 08/04/2022 08/04/22   Nira Conn, MD      Allergies    Hydroxyzine    Review of Systems   Suicidal ideation  Physical Exam Updated Vital Signs BP 122/85 (BP Location: Left Arm)   Pulse 83   Temp 98.6 F (37 C) (Oral)   Resp 18   Ht 5\' 1"  (1.549 m)   Wt 50.8 kg   SpO2 100%   BMI 21.16 kg/m  Physical Exam Vitals and nursing note reviewed.  HENT:     Head: Normocephalic and atraumatic.     Mouth/Throat:     Mouth: Mucous membranes are moist.  Eyes:      General:        Right eye: No discharge.        Left eye: No discharge.     Conjunctiva/sclera: Conjunctivae normal.  Cardiovascular:     Rate and Rhythm: Normal rate and regular rhythm.     Pulses: Normal pulses.     Heart sounds: Normal heart sounds.  Pulmonary:     Effort: Pulmonary effort is normal.     Breath sounds: Normal breath sounds.  Abdominal:     General: Abdomen is flat.     Palpations: Abdomen is soft.  Skin:    General: Skin is warm and dry.  Neurological:     General: No focal deficit present.  Psychiatric:        Mood and Affect: Mood normal.     ED Results / Procedures / Treatments   Labs (all labs ordered are listed, but only abnormal results are displayed) Labs Reviewed  COMPREHENSIVE METABOLIC PANEL - Abnormal; Notable for the following components:      Result Value   Potassium 3.3 (*)    Glucose, Bld 115 (*)    Total Protein 8.7 (*)    All other components within normal limits  ETHANOL  RAPID URINE DRUG SCREEN, HOSP PERFORMED  CBC WITH DIFFERENTIAL/PLATELET  HCG, SERUM, QUALITATIVE    EKG None  Radiology No results found.  Procedures Procedures    Medications Ordered in ED Medications  FLUoxetine (PROZAC) capsule 10 mg (10 mg Oral Given 08/04/22 1836)  LORazepam (ATIVAN) tablet 1 mg (1 mg Oral Given 08/04/22 2034)  traZODone (DESYREL) tablet 50 mg (has no administration in time range)  LORazepam (ATIVAN) tablet 0.5 mg (0.5 mg Oral Given 08/04/22 1836)    ED Course/ Medical Decision Making/ A&P                             Medical Decision Making Amount and/or Complexity of Data Reviewed Labs: ordered.  Risk Prescription drug management.   32 year old well-appearing female presenting for suicidal ideation.  Exam was unremarkable.  Workup overall is reassuring and does not indicate acute pathology that may be contributing to her symptoms today.  Placed in psych hold for plans for TTS evaluation and further  management.        Final Clinical Impression(s) / ED Diagnoses Final diagnoses:  Suicidal ideation    Rx / DC Orders ED Discharge Orders     None         Gareth Eagle, PA-C 08/04/22 2038    Gloris Manchester, MD 08/10/22 [phone]

## 2022-08-04 NOTE — Consult Note (Signed)
[facility] ED ASSESSMENT   Reason for Consult:  Psych Consult Referring Physician:  Riki Sheer Patient Identification: Nancy Hurst MRN:  [id number] ED Chief Complaint: Generalized anxiety disorder  Diagnosis:  Principal Problem:   Generalized anxiety disorder   ED Assessment Time Calculation: Start Time: 1545 Stop Time: 1641 Total Time in Minutes (Assessment Completion): 56   HPI:  Nancy Hurst is a 32 year old female brought into the emergency department by her husband for recurrent palpitations. Patient feels very anxious. She denies shortness of breath.    Subjective:   [name] is a 32 y.o. female patient is assessed with an interpreter while sitting in a chair in the emergency room with her husband by her side. Patient is endorsing feeling anxious. She states she hasn't slept since Sunday. She endorses passive suicidal ideation; she says she doesn't want to die she just wants the anxiety to stop. Patient asks if she can just "have a shot" to make her feel better.  Patient denies homicidal ideation, auditory and visual hallucinations.    Patient states she had one episode similar to this in July of 2023 that she was able to work through on her own.  This current episode started about a week ago and at this point she is unable to manage on her own.  She states she is under significant stress with her work.  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  Patients spouse mentions that patient has been scratching her legs (and he indicates upper thigh areas) constantly for the last 4 months.  Patient states agrees that her legs itch all the time and she rubs them a lot. Patient says that she hasn't slept since Sunday and that there have been 4 or 5 other times when she has gone without sleep for a period of time.   Patient endorses regular nightmares and says they are about work, but is unable to further explain.   Patient endorses recent weight loss. She doesn't know how much she has lost, but says her clothes are now too big. Patient denies use of all substances.  Patient denies use of herbs or prescriptions.  She states she only uses tylenol occasionally for headache. Patient denies any psychiatric family history but endorses her mother has diabetes. Patient lives with her husband.  Husband said there are no guns in the home, that he has them elsewhere.         Past Psychiatric History: Patient denies psychiatric history  Risk to Self or Others: Is the patient at risk to self? Yes Has the patient been a risk to self in the past 6 months? No Has the patient been a risk to self within the distant past? No Is the patient a risk to others? No Has the patient been a risk to others in the past 6 months? No Has the patient been a risk to others within the distant past? No  [place] Scale:  Flowsheet Row ED from 08/04/2022 in [facility] Emergency Department at [facility] ED from 08/03/2022 in [facility] Emergency Department at [facility] ED from 08/02/2022 in [facility] Emergency Department at [facility]  C-SSRS RISK CATEGORY No Risk No Risk No Risk        Past Medical History:  Past Medical History:  Diagnosis  Date   Infertility, female    Tuberculosis     Past Surgical History:  Procedure Laterality Date   BREAST SURGERY     left breast   IR PARACENTESIS  01/26/2019   IR THORACENTESIS ASP PLEURAL SPACE W/IMG GUIDE  01/25/2019   LAPAROSCOPIC APPENDECTOMY N/A 11/03/2017   Procedure: APPENDECTOMY LAPAROSCOPIC;  Surgeon: Almond Lint, MD;  Location: [facility] ORS;  Service: General;  Laterality: N/A;   Family History:  Family History  Problem Relation Age of Onset   Hypertension Mother    Family Psychiatric  History: Patient denies family psychiatric history Social  History:  Social History   Substance and Sexual Activity  Alcohol Use No     Social History   Substance and Sexual Activity  Drug Use No    Social History   Socioeconomic History   Marital status: Single    Spouse name: Not on file   Number of children: 0   Years of education: Not on file   Highest education level: 9th grade  Occupational History   Not on file  Tobacco Use   Smoking status: Never   Smokeless tobacco: Never  Vaping Use   Vaping Use: Never used  Substance and Sexual Activity   Alcohol use: No   Drug use: No   Sexual activity: Yes    Partners: Male    Birth control/protection: None  Other Topics Concern   Not on file  Social History Narrative   ** Merged History Encounter **       Social Determinants of Health   Financial Resource Strain: Not on file  Food Insecurity: Not on file  Transportation Needs: No Transportation Needs (07/10/2020)   PRAPARE - Administrator, Civil Service (Medical): No    Lack of Transportation (Non-Medical): No  Physical Activity: Not on file  Stress: Not on file  Social Connections: Not on file   Additional Social History:    Allergies:   Allergies  Allergen Reactions   Hydroxyzine Anxiety and Other (See Comments)    Worsened patient's anxiety she said (25 mg tablet form)    Labs:  Results for orders placed or performed during the hospital encounter of 08/02/22 (from the past 48 hour(s))  Basic metabolic panel     Status: Abnormal   Collection Time: 08/02/22  8:19 PM  Result Value Ref Range   Sodium 137 135 - 145 mmol/L   Potassium 3.5 3.5 - 5.1 mmol/L   Chloride 103 98 - 111 mmol/L   CO2 18 (L) 22 - 32 mmol/L   Glucose, Bld 158 (H) 70 - 99 mg/dL    Comment: Glucose reference range applies only to samples taken after fasting for at least 8 hours.   BUN 10 6 - 20 mg/dL   Creatinine, Ser 7.82 0.44 - 1.00 mg/dL   Calcium 9.3 8.9 - 95.6 mg/dL   GFR, Estimated >21 >30 mL/min    Comment:  (NOTE) Calculated using the CKD-EPI Creatinine Equation (2021)    Anion gap 16 (H) 5 - 15    Comment: Performed at [facility], [address], [place], [place] [address]  CBC     Status: None   Collection Time: 08/02/22  8:19 PM  Result Value Ref Range   WBC 6.8 4.0 - 10.5 K/uL   RBC 4.31 3.87 - 5.11 MIL/uL   Hemoglobin 13.8 12.0 - 15.0 g/dL   HCT 46.9 62.9 - 52.8 %   MCV 92.8 80.0 -  100.0 fL   MCH 32.0 26.0 - 34.0 pg   MCHC 34.5 30.0 - 36.0 g/dL   RDW 09.8 11.9 - 14.7 %   Platelets 359 150 - 400 K/uL   nRBC 0.0 0.0 - 0.2 %    Comment: Performed at [facility], [address], [place], [place] [address]  Troponin I (High Sensitivity)     Status: None   Collection Time: 08/02/22  8:19 PM  Result Value Ref Range   Troponin I (High Sensitivity) <2 <18 ng/L    Comment: (NOTE) Elevated high sensitivity troponin I (hsTnI) values and significant  changes across serial measurements may suggest ACS but many other  chronic and acute conditions are known to elevate hsTnI results.  Refer to the "Links" section for chest pain algorithms and additional  guidance. Performed at [facility], [address], [place], [place] [address]   TSH     Status: None   Collection Time: 08/02/22  8:19 PM  Result Value Ref Range   TSH 0.489 0.350 - 4.500 uIU/mL    Comment: Performed by a 3rd Generation assay with a functional sensitivity of <=0.01 uIU/mL. Performed at [facility], [address], [place], [place] [address]   I-Stat beta hCG blood, ED     Status: None   Collection Time: 08/02/22  8:30 PM  Result Value Ref Range   I-stat hCG, quantitative <5.0 <5 mIU/mL   Comment 3            Comment:   GEST. AGE      CONC.  (mIU/mL)   <=1 WEEK        5 - 50     2 WEEKS       50 - 500     3 WEEKS       100 - 10,000     4 WEEKS     1,000 - 30,000        FEMALE AND NON-PREGNANT FEMALE:     LESS THAN 5 mIU/mL   Troponin I (High Sensitivity)     Status: None   Collection  Time: 08/02/22 10:27 PM  Result Value Ref Range   Troponin I (High Sensitivity) <2 <18 ng/L    Comment: (NOTE) Elevated high sensitivity troponin I (hsTnI) values and significant  changes across serial measurements may suggest ACS but many other  chronic and acute conditions are known to elevate hsTnI results.  Refer to the "Links" section for chest pain algorithms and additional  guidance. Performed at [facility], [address], [place], [place] [address]     Current Facility-Administered Medications  Medication Dose Route Frequency Provider Last Rate Last Admin   LORazepam (ATIVAN) tablet 0.5 mg  0.5 mg Oral Once Gareth Eagle, PA-C       Current Outpatient Medications  Medication Sig Dispense Refill   acetaminophen (TYLENOL) 500 MG tablet Take 500-1,000 mg by mouth every 6 (six) hours as needed for headache or mild pain.     amoxicillin (AMOXIL) 500 MG capsule Take 500 mg by mouth 3 (three) times daily.     UNKNOWN TO PATIENT Inject 1 Syringe into the skin See admin instructions. Unknown to patient- Inject one syringe into the skin once "as needed for anxiety"     diclofenac Sodium (VOLTAREN) 1 % GEL Apply 2 g topically 4 (four) times daily. (Patient not taking: Reported on 08/04/2022) 100 g 0   omeprazole (PRILOSEC) 20  MG capsule Take 1 capsule (20 mg total) by mouth daily. (Patient not taking: Reported on 08/04/2022) 30 capsule 0    Musculoskeletal: Strength & Muscle Tone: within normal limits Gait & Station: normal Patient leans: N/A   Psychiatric Specialty Exam: Presentation  General Appearance:  Appropriate for Environment  Eye Contact: Fair  Speech: Clear and Coherent  Speech Volume: Normal  Handedness: Right   Mood and Affect  Mood: Anxious  Affect: Congruent   Thought Process  Thought Processes: Coherent  Descriptions of Associations:Intact  Orientation:Full (Time, Place and Person)  Thought Content:WDL  History of  Schizophrenia/Schizoaffective disorder:No data recorded Duration of Psychotic Symptoms:No data recorded Hallucinations:Hallucinations: None  Ideas of Reference:None  Suicidal Thoughts:Suicidal Thoughts: Yes, Passive SI Passive Intent and/or Plan: Without Intent; Without Plan  Homicidal Thoughts:Homicidal Thoughts: No   Sensorium  Memory: Recent Good; Immediate Good; Remote Good  Judgment: Fair  Insight: Fair   Art therapist  Concentration: Good  Attention Span: Good  Recall: Good  Fund of Knowledge: Good  Language: Good   Psychomotor Activity  Psychomotor Activity: Psychomotor Activity: Normal   Assets  Assets: Communication Skills; Desire for Improvement; Social Support    Sleep  Sleep: Sleep: Poor   Physical Exam: Physical Exam Vitals and nursing note reviewed.  HENT:     Head: Normocephalic.  Eyes:     Pupils: Pupils are equal, round, and reactive to light.  Pulmonary:     Effort: Pulmonary effort is normal.  Skin:    General: Skin is dry.  Neurological:     Mental Status: She is alert and oriented to person, place, and time.    Review of Systems  Constitutional:  Positive for weight loss.  Cardiovascular:  Positive for palpitations.  Skin:  Positive for itching.  Neurological:  Positive for tremors.  Psychiatric/Behavioral:  Positive for suicidal ideas. The patient is nervous/anxious.   All other systems reviewed and are negative.  Blood pressure 122/85, pulse 83, temperature 98.6 F (37 C), temperature source Oral, resp. rate 18, height 5\' 1"  (1.549 m), weight 50.8 kg, SpO2 100 %. Body mass index is 21.16 kg/m.  Medical Decision Making: Patient case reviewed and discussed with Dr Lucianne Muss.  Patient meets criteria for inpatient treatment. She is endorsing suicidal ideation and has not slept in 3 days.  Refer for inpatient psychiatric treatment for stabilization.  Problem 1: Anxiety -Start fluoxetine 10mg  PO Q day -Start  trazodone 50mg  PO Q HS -Start ativan 1mg  PO BID PRN anxiety -Refer for inpatient psychiatric treatment   Disposition:  She is endorsing suicidal ideation and has not slept in 3 days. Recommend psychiatric Inpatient admission when medically cleared.  Thomes Lolling, NP 08/04/2022 5:35 PM

## 2022-08-04 NOTE — ED Notes (Signed)
Per [facility], pt will have to be admitted in the AM, pending current discharges at [facility].

## 2022-08-05 ENCOUNTER — Inpatient Hospital Stay (HOSPITAL_COMMUNITY)
Admission: AD | Admit: 2022-08-05 | Discharge: 2022-08-06 | DRG: 880 | Disposition: A | Discharge status: Home / Self Care | Payer: No Typology Code available for payment source | Source: Intra-hospital | Attending: Psychiatry | Admitting: Psychiatry

## 2022-08-05 ENCOUNTER — Encounter (HOSPITAL_COMMUNITY): Payer: Self-pay | Admitting: Psychiatry

## 2022-08-05 DIAGNOSIS — R45851 Suicidal ideations: Secondary | ICD-10-CM | POA: Diagnosis present

## 2022-08-05 DIAGNOSIS — F411 Generalized anxiety disorder: Secondary | ICD-10-CM | POA: Diagnosis not present

## 2022-08-05 DIAGNOSIS — F410 Panic disorder [episodic paroxysmal anxiety] without agoraphobia: Secondary | ICD-10-CM | POA: Diagnosis present

## 2022-08-05 MED ORDER — BUSPIRONE HCL 5 MG PO TABS: 10.0000 mg | ORAL_TABLET | Freq: Three times a day (TID) | ORAL | Status: DC | PRN

## 2022-08-05 MED ORDER — ACETAMINOPHEN 325 MG PO TABS: 650.0000 mg | ORAL_TABLET | Freq: Four times a day (QID) | ORAL | Status: DC | PRN

## 2022-08-05 MED ORDER — TRAZODONE HCL 100 MG PO TABS
100.0000 mg | ORAL_TABLET | Freq: Every day | ORAL | Status: DC
  Filled 2022-08-05 (×2): qty 1

## 2022-08-05 MED ORDER — ALUM & MAG HYDROXIDE-SIMETH 200-200-20 MG/5ML PO SUSP: 30.0000 mL | ORAL | Status: DC | PRN

## 2022-08-05 MED ORDER — MAGNESIUM HYDROXIDE 400 MG/5ML PO SUSP: 30.0000 mL | Freq: Every day | ORAL | Status: DC | PRN

## 2022-08-05 NOTE — ED Notes (Signed)
Pt's belongings were moved to cabinet at 5-8 nurses station. Pt has two pt belonging bags and one food lion bag that are labeled with pt labels.

## 2022-08-05 NOTE — Progress Notes (Signed)
Admission note: Patient is a 32 year old,Spanish speaking female, who was voluntarily admitted from [facility] for uncontrolled anxiety. According to the report received from [facility] in the ED, patient started having anxiety since last Sunday, she was also having feelings of doom, and unable to cope well with a new change at work. Patient denies SI/HI/AVH.   Patient arrive to the unit at 1322 via safe transport. Patient presented with depressed mood and was a little confused due to language barrier. Staff were able to use the Spanish interpreter, which helped a lot. Patient was guarded during admission interview, she only responded to few questions, and mentioned that she was ready to leave the facility. Patient states she does not plan to stay here on the [facility] unit. The length of stay was communicated with, and she signed treatments agreement.  Pt has  orientation to unit, room and routine, Information packet given to patient and safety information discussed with her.  Admission INP armband ID verified with patient, and in place. Fall risk assessment completed with Patient and she verbalized understanding of risks associated with falls. No contraband found during skin assessment, Skin, clean-dry- intact without evidence of bruising, or skin tears and tracks marks.  Q 15 minutes safety observation in place. No distress noted at this time. Staff will continue to provide support and reassurance to patient.

## 2022-08-05 NOTE — Tx Team (Signed)
Initial Treatment Plan 08/05/2022 12:47 PM Nancy Hurst ZOX:[id number]    PATIENT STRESSORS: Other: emotional     PATIENT STRENGTHS: Ability for insight  Supportive family/friends    PATIENT IDENTIFIED PROBLEMS: Anxiety  Depression                   DISCHARGE CRITERIA:  Adequate post-discharge living arrangements Verbal commitment to aftercare and medication compliance  PRELIMINARY DISCHARGE PLAN: Return to previous living arrangement Return to previous work or school arrangements  PATIENT/FAMILY INVOLVEMENT: This treatment plan has been presented to and reviewed with the patient, Nancy Hurst, has been given the opportunity to ask questions and make suggestions.  Melvenia Needles, RN 08/05/2022, 12:47 PM

## 2022-08-05 NOTE — Group Note (Signed)
Date:  08/05/2022 Time:  12:15 PM  Group Topic/Focus:  Recovery Goals:   The focus of this group is to identify appropriate goals for recovery and establish a plan to achieve them. Stages of Change:   The focus of this group is to explain the stages of change and help patients identify changes they want to make upon discharge.    Participation Level:  Did Not Attend  Participation Quality:    Affect:    Cognitive:    Insight:   Engagement in Group:    Modes of Intervention:    Additional Comments:    Memory Dance Lachele Lievanos 08/05/2022, 12:15 PM

## 2022-08-05 NOTE — ED Provider Notes (Signed)
Emergency Medicine Observation Re-evaluation Note  Nancy Hurst is a 32 y.o. female, seen on rounds today.  Pt initially presented to the ED for complaints of Anxiety Currently, the patient is eating breakfast without acute distress.  Physical Exam  BP 112/81 (BP Location: Left Arm)   Pulse 92   Temp 98.5 F (36.9 C) (Oral)   Resp 16   Ht 5\' 1"  (1.549 m)   Wt 50.8 kg   SpO2 99%   BMI 21.16 kg/m  Physical Exam General: Resting without distress. Cardiac: No murmur on my exam Lungs: Clear to auscultation bilaterally Psych: No acute agitation  ED Course / MDM  EKG:   I have reviewed the labs performed to date as well as medications administered while in observation.  Recent changes in the last 24 hours include none reported.  Plan  Current plan is for awaiting admission to [facility] per most recent notation.    Rayquon Uselman, Canary Brim, MD 08/05/22 1007

## 2022-08-05 NOTE — ED Notes (Signed)
Called [facility], no bed available yet for pt placement. [facility] to reach out when bed is ready, estimated to be sometime this afternoon.

## 2022-08-05 NOTE — ED Notes (Signed)
Pt belongings brought with pt to safe transport.

## 2022-08-05 NOTE — ED Notes (Signed)
Rounded on pt, currently resting in bed comfortably. NAD noted. Pt currently denies any anxiety or complaints.

## 2022-08-05 NOTE — Plan of Care (Signed)
  Problem: Education: Goal: Knowledge of [facility] Education information/materials will improve Outcome: Progressing Goal: Emotional status will improve Outcome: Progressing Goal: Mental status will improve Outcome: Progressing Goal: Verbalization of understanding the information provided will improve Outcome: Progressing   Problem: Education: Goal: Emotional status will improve Outcome: Progressing   

## 2022-08-05 NOTE — Progress Notes (Signed)
   08/05/22 1912  Psych Admission Type (Psych Patients Only)  Admission Status Voluntary/72 hour document signed  Date 72 hour document signed  08/05/22  Time 72 hour document signed  1912  Provider Notified (First and Last Name) (see details for LINK to note) Dr. Sherron Flemings

## 2022-08-06 ENCOUNTER — Encounter (HOSPITAL_COMMUNITY): Payer: Self-pay

## 2022-08-06 DIAGNOSIS — F411 Generalized anxiety disorder: Secondary | ICD-10-CM | POA: Diagnosis not present

## 2022-08-06 DIAGNOSIS — F410 Panic disorder [episodic paroxysmal anxiety] without agoraphobia: Principal | ICD-10-CM

## 2022-08-06 MED ORDER — ALUM & MAG HYDROXIDE-SIMETH 200-200-20 MG/5ML PO SUSP: 30.0000 mL | ORAL | Status: DC | PRN

## 2022-08-06 MED ORDER — BISMUTH SUBSALICYLATE 262 MG PO CHEW: 524.0000 mg | CHEWABLE_TABLET | ORAL | Status: DC | PRN

## 2022-08-06 MED ORDER — BUSPIRONE HCL 5 MG PO TABS: 10.0000 mg | ORAL_TABLET | Freq: Three times a day (TID) | ORAL | Status: DC | PRN

## 2022-08-06 MED ORDER — ONDANSETRON HCL 4 MG PO TABS: 8.0000 mg | ORAL_TABLET | Freq: Three times a day (TID) | ORAL | Status: DC | PRN

## 2022-08-06 MED ORDER — POLYETHYLENE GLYCOL 3350 17 G PO PACK: 17.0000 g | PACK | Freq: Every day | ORAL | Status: DC | PRN

## 2022-08-06 MED ORDER — TRAZODONE HCL 100 MG PO TABS
100.0000 mg | ORAL_TABLET | Freq: Every day | ORAL | Status: DC
  Filled 2022-08-06 (×2): qty 1

## 2022-08-06 MED ORDER — ACETAMINOPHEN 325 MG PO TABS: 650.0000 mg | ORAL_TABLET | Freq: Four times a day (QID) | ORAL | Status: DC | PRN

## 2022-08-06 MED ORDER — SENNA 8.6 MG PO TABS: 1.0000 | ORAL_TABLET | Freq: Every evening | ORAL | Status: DC | PRN

## 2022-08-06 NOTE — [facility] Suicide Risk Assessment (Signed)
[facility] Admission Suicide Risk Assessment  Nursing information obtained from:  Patient Demographic factors:  Low socioeconomic status Current Mental Status:  NA Loss Factors:  NA Historical Factors:  NA Risk Reduction Factors:  NA  Total Time spent with patient: 2 hours Principal Problem: Panic attacks Diagnosis:  Principal Problem:   Panic attacks   Subjective Data: patient denies passive or active SI. Conflicting reports of suicidal ideation on chart Obtained collateral information confirms  Continued Clinical Symptoms:     CLINICAL FACTORS:   Panic Attacks  Psychiatric Specialty Exam: Presentation  General Appearance:  Appropriate for Environment   Eye Contact: Fair   Speech: Clear and Coherent   Speech Volume: Normal   Handedness: Right     Mood and Affect  Mood: Anxious   Affect: Congruent     Thought Process  Thought Processes: Coherent   Descriptions of Associations:Intact   Orientation:Full (Time, Place and Person)   Thought Content:WDL   Hallucinations:No data recorded Ideas of Reference:None   Suicidal Thoughts:No data recorded Homicidal Thoughts:No data recorded   Sensorium  Memory: Recent Good; Immediate Good; Remote Good   Judgment: Fair   Insight: Fair     Art therapist  Concentration: Good   Attention Span: Good   Recall: Good   Fund of Knowledge: Good   Language: Good     Psychomotor Activity  Psychomotor Activity:No data recorded   Assets  Assets: Communication Skills; Desire for Improvement; Social Support     Sleep  Sleep:No data recorded   ROS and Physical Exam Review of Systems  Constitutional: Negative.   Respiratory: Negative.    Cardiovascular: Negative.   Gastrointestinal: Negative.   Genitourinary: Negative.       Blood pressure 107/77, pulse (!) 102, temperature 98.6 F (37 C), temperature source Oral, resp. rate 18, height 5\' 1"  (1.549 m), weight 49.1 kg, SpO2 100 %. Body  mass index is 20.44 kg/m. Physical Exam HENT:     Head: Normocephalic.  Pulmonary:     Effort: Pulmonary effort is normal.  Neurological:     General: No focal deficit present.     Mental Status: She is alert.   COGNITIVE FEATURES THAT CONTRIBUTE TO RISK:  None    SUICIDE RISK:  Acute Risk:  Minimal: No identifiable suicidal ideation.  Patients presenting with no risk factors but with morbid ruminations; may be classified as minimal risk based on the severity of the depressive symptoms  Chronic Risk:  Minimal: No identifiable suicidal ideation.  Patients presenting with no risk factors but with morbid ruminations; may be classified as minimal risk based on the severity of the depressive symptoms  PLAN OF CARE: see H&P for full plan of care  I certify that inpatient services furnished can reasonably be expected to improve the patient's condition.   Signed: Augusto Gamble, MD 08/06/2022, 2:27 PM

## 2022-08-06 NOTE — Discharge Instructions (Signed)
Dear Nancy Hurst,  It was a pleasure to take care of you during your stay at [facility] where you were treated for your Generalized anxiety disorder.  While you were here, you were:  observed and cared for by our nurses and nursing assistants  treated with medications by your psychiatrists  evaluated with imaging / lab tests, and treated with medicines / procedures by your doctors  provided individual and group therapy by therapists  provided resources by our social workers and case managers  Please review the medication list provided to you at discharge and stop, start taking, or continue taking the medications listed there.  You should also follow-up with your primary care doctor, or start seeing one if you don't have one yet. If applicable, here are some scheduled follow-ups for you:  Follow-up Information     [facility]. Go on 09/07/2022.   Specialty: Behavioral Health Why: You have an appointment for medication management services on 09/07/22 at 8:00 am, in person.  You also have an appointment for therapy services on 09/28/22 at 1:00 pm, in person.   *You may also go to this provider at walk in time of 7:00 am, Monday through Friday if you wish to be seen sooner.  (After 2 no shows, you may only use walk-in services)  Interpretation services are available. Contact information: [address] [place] [address] [phone]                 I recommend abstinence from alcohol, tobacco, and other illicit drug use.   If your psychiatric symptoms or suicidal thoughts recur, worsen, or if you have side effects to your psychiatric medications, call your outpatient psychiatric provider, 911, [phone] or go to the nearest emergency department.  Take care!  Signed: Augusto Gamble, MD 08/06/2022, 1:10 PM  Naloxone (Narcan) can help reverse an overdose when given to the victim quickly.  [employer] offers free naloxone kits  and instructions/training on its use.  Add naloxone to your first aid kit and you can help save a life. A prescription can be filled at your local pharmacy or free kits are provided by the county.  Pick up your free kit at the following locations:   [place]:  [facility], [address] [place] [address] [phone]) [facility] [address] [place] [address] [phone]) [facility] [address] [place] [address]  [place]: [facility] [address] [place] [address] ([phone]) [facility] [address] [place] [address] [phone])

## 2022-08-06 NOTE — Group Note (Signed)
Recreation Therapy Group Note   Group Topic:Team Building  Group Date: 08/06/2022 Start Time: 0940 End Time: 1012 Facilitators: Maliek Schellhorn-McCall, LRT,CTRS Location: [address] Day[address]   Goal Area(s) Addresses:  Patient will effectively work with peer towards shared goal.  Patient will identify skills used to make activity successful.  Patient will identify how skills used during activity can be applied to reach post d/c goals.    Group Description: [employer]. In teams of 5-6, patients were given 11 craft pipe cleaners. Using the materials provided, patients were instructed to compete again the opposing team(s) to build the tallest free-standing structure from floor level. The activity was timed; difficulty increased by Clinical research associate as Production designer, theatre/television/film continued.  Systematically resources were removed with additional directions for example, placing one arm behind their back, working in silence, and shape stipulations. LRT facilitated post-activity discussion reviewing team processes and necessary communication skills involved in completion. Patients were encouraged to reflect how the skills utilized, or not utilized, in this activity can be incorporated to positively impact support systems post discharge.   Affect/Mood: N/A   Participation Level: Did not attend    Clinical Observations/Individualized Feedback:    Plan: Continue to engage patient in RT group sessions 2-3x/week.   Vernis Cabacungan-McCall, LRT,CTRS 08/06/2022 12:06 PM

## 2022-08-06 NOTE — Discharge Summary (Signed)
Physician Discharge Summary Note Patient:  Nancy Hurst is an 32 y.o., female MRN:  [id number] DOB:  1990/08/27 Patient phone:  [phone] (home)  Patient address:   [address] [place] [facility] [address],  Total Time spent with patient: 2 hours  Date of Admission:  08/05/2022 Date of Discharge: 08/06/2022  Reason for Admission:  Nancy Hurst is a 32 y.o., female with a past psychiatric history significant for panic attacks who presents to the [facility] Voluntary from  [facility] Emergency Department for evaluation and management of worsening panic attacks and concern for suicidal thoughts.   Principal Problem: Panic attacks Discharge Diagnoses: Principal Problem:   Panic attacks  Past Psychiatric History:  Previous psych diagnoses: Denies Prior inpatient psychiatric treatment: Denies Current/prior outpatient psychiatric treatment: Denies Current psychiatric provider: Denies   Current therapist: Denies Psychotherapy hx: Denies   History of suicide attempts: Denies History of homicide: Denies  Past Medical History:  Past Medical History:  Diagnosis Date   Infertility, female    Tuberculosis     Past Surgical History:  Procedure Laterality Date   BREAST SURGERY     left breast   IR PARACENTESIS  01/26/2019   IR THORACENTESIS ASP PLEURAL SPACE W/IMG GUIDE  01/25/2019   LAPAROSCOPIC APPENDECTOMY N/A 11/03/2017   Procedure: APPENDECTOMY LAPAROSCOPIC;  Surgeon: Almond Lint, MD;  Location: [facility] ORS;  Service: General;  Laterality: N/A;    Family History:  Medical: none Psych: none Psych Rx: unknown Suicide: none Homicide: none Substance use family hx: none   Social History:  Place of birth and grew up where: born in [place], [place], grew up in [place] Abuse: no history of abuse Marital Status: partnered Sexual orientation: straight Children: no children Employment: employed at [employer] level of  education:  finished junior high Housing: living with partner Finances:  income Legal: no Special educational needs teacher: never served Consulting civil engineer: denies Pills stockpile: denies  Hospital Course:   During the patient's hospitalization, patient had extensive initial psychiatric evaluation, and follow-up psychiatric evaluations every day.  Psychiatric diagnoses provided upon initial assessment: Principal Problem:   Panic attacks   The following medications were managed:  acetaminophen, alum & mag hydroxide-simeth, bismuth subsalicylate, busPIRone, ondansetron, polyethylene glycol, senna  traZODone  100 mg Oral QHS   Nancy Hurst is a 32 y.o., female with a past psychiatric history significant for panic attacks who presents to the [facility] Voluntary from  [facility] Emergency Department for evaluation and management of worsening panic attacks and concern for suicidal thoughts.   Gradually, patient started adjusting to milieu. The patient was evaluated each day by a clinical provider to ascertain response to treatment. Improvement was noted by the patient's report of decreasing symptoms, improved sleep and appetite, affect, medication tolerance, behavior, and participation in unit programming.  Patient was asked each day to complete a self inventory noting mood, mental status, pain, new symptoms, anxiety and concerns.    Symptoms were reported as significantly decreased or resolved completely by discharge.   On day of discharge, the patient reports that their mood is stable. The patient denied having suicidal thoughts for more than 48 hours prior to discharge.  Patient denies having homicidal thoughts.  Patient denies having auditory hallucinations.  Patient denies any visual hallucinations or other symptoms of psychosis. The patient was motivated to continue taking medication with a goal of continued improvement in mental health.   The patient reports their target  psychiatric symptoms of panic  attack responded well to the psychiatric medications, and the patient reports overall benefit other psychiatric hospitalization. Supportive psychotherapy was provided to the patient. The patient also participated in regular group therapy while hospitalized. Coping skills, problem solving as well as relaxation therapies were also part of the unit programming.  Labs were reviewed with the patient, and abnormal results were discussed with the patient.  The patient is able to verbalize their individual safety plan to this provider.  Behavioral Events: none  Restraints: none  # It is recommended to the patient to continue psychiatric medications as prescribed, after discharge from the hospital.    # It is recommended to the patient to follow up with your outpatient psychiatric provider and PCP.  # It was discussed with the patient, the impact of alcohol, drugs, tobacco have been there overall psychiatric and medical wellbeing, and total abstinence from substance use was recommended to the patient.  # Prescriptions provided or sent directly to preferred pharmacy at discharge. Patient agreeable to plan. Given opportunity to ask questions. Appears to feel comfortable with discharge.    # In the event of worsening symptoms, the patient is instructed to call the crisis hotline, 911 and or go to the nearest ED for appropriate evaluation and treatment of symptoms. To follow-up with primary care provider for other medical issues, concerns and or health care needs  # Patient was discharged home with a plan to follow up as noted below.  # Counseled patient and her husband that she may benefit from OTC melatonin  Physical Findings: AIMS:  , ,  ,  ,    CIWA:    COWS:     Psychiatric Specialty Exam: Presentation  General Appearance: Appropriate for Environment  Eye Contact:Fair  Speech:Clear and Coherent  Speech Volume:Normal  Handedness:Right   Mood and Affect   Mood:Anxious  Affect:Congruent   Thought Process  Thought Processes:Coherent  Descriptions of Associations:Intact  Orientation:Full (Time, Place and Person)  Thought Content:WDL  Hallucinations:No data recorded Ideas of Reference:None  Suicidal Thoughts:No data recorded Homicidal Thoughts:No data recorded  Sensorium  Memory:Recent Good; Immediate Good; Remote Good  Judgment:Fair  Insight:Fair   Executive Functions  Concentration:Good  Attention Span:Good  Recall:Good  Fund of Knowledge:Good  Language:Good   Psychomotor Activity  Psychomotor Activity:No data recorded Assets  Assets:Communication Skills; Desire for Improvement; Social Support  Sleep  Sleep:No data recorded  Nutritional Assessment (For OBS and FBC admissions only) Has the patient recently lost weight without trying?: 0 Has the patient been eating poorly because of a decreased appetite?: 0 Malnutrition Screening Tool Score: 0    ROS and Physical Exam Review of Systems  Constitutional: Negative.   Respiratory: Negative.    Cardiovascular: Negative.   Gastrointestinal: Negative.   Genitourinary: Negative.     Blood pressure 107/77, pulse (!) 102, temperature 98.6 F (37 C), temperature source Oral, resp. rate 18, height 5\' 1"  (1.549 m), weight 49.1 kg, SpO2 100 %. Body mass index is 20.44 kg/m. Physical Exam HENT:     Head: Normocephalic.  Pulmonary:     Effort: Pulmonary effort is normal.  Neurological:     General: No focal deficit present.     Mental Status: She is alert.     Assets  Assets:Communication Skills; Desire for Improvement; Social Support   Social History   Tobacco Use  Smoking Status Never  Smokeless Tobacco Never   Tobacco Cessation:  N/A, patient does not currently use tobacco products  Blood Alcohol level:  Lab Results  Component Value Date   ETH <10 08/04/2022    Metabolic Disorder Labs:  No results found for: "HGBA1C", "MPG" No results  found for: "PROLACTIN" No results found for: "CHOL", "TRIG", "HDL", "CHOLHDL", "VLDL", "LDLCALC"  Discharge destination: home  Is patient on multiple antipsychotic therapies at discharge:  No   Has Patient had three or more failed trials of antipsychotic monotherapy by history:  No  Recommended Plan for Multiple Antipsychotic Therapies: NA   Allergies as of 08/06/2022       Reactions   Hydroxyzine Anxiety, Other (See Comments)   Worsened patient's anxiety she said (25 mg tablet form)        Medication List     STOP taking these medications    acetaminophen 500 MG tablet Commonly known as: TYLENOL   amoxicillin 500 MG capsule Commonly known as: AMOXIL   diclofenac Sodium 1 % Gel Commonly known as: Voltaren   omeprazole 20 MG capsule Commonly known as: PRILOSEC   UNKNOWN TO PATIENT        Follow-up Information     [facility]. Go on 09/07/2022.   Specialty: Behavioral Health Why: You have an appointment for medication management services on 09/07/22 at 8:00 am, in person.  You also have an appointment for therapy services on 09/28/22 at 1:00 pm, in person.   *You may also go to this provider at walk in time of 7:00 am, Monday through Friday if you wish to be seen sooner.  (After 2 no shows, you may only use walk-in services)  Interpretation services are available. Contact information: [address] [place] [address] [phone]                Discharge recommendations:  Activity: as tolerated  Diet: as tolerated  # It is recommended to the patient to continue psychiatric medications as prescribed, after discharge from the hospital.     # It is recommended to the patient to follow up with your outpatient psychiatric provider -instructions on appointment date, time, and address (location) are provided to you in discharge paperwork  # Follow-up with outpatient primary care doctor and other specialists -for  management of chronic medical disease, including: health maintenance checks  # Testing: Follow-up with outpatient provider for abnormal lab results: none   # It was discussed with the patient, the impact of alcohol, drugs, tobacco have been there overall psychiatric and medical wellbeing, and total abstinence from substance use was recommended to the patient.   # Prescriptions provided or sent directly to preferred pharmacy at discharge. Patient agreeable to plan. Given opportunity to ask questions. Appears to feel comfortable with discharge.    # In the event of worsening symptoms, the patient is instructed to call the crisis hotline, 911, and or go to the nearest ED for appropriate evaluation and treatment of symptoms. To follow-up with primary care provider for other medical issues, concerns and or health care needs  Patient agrees with D/C instructions and plan.   I discussed my assessment, planned testing and intervention for the patient with Dr. Abbott Pao who agrees with my formulated course of action.  Signed: Augusto Gamble, MD 08/06/2022, 1:15 PM

## 2022-08-06 NOTE — BHH Suicide Risk Assessment (Signed)
[facility] Discharge Suicide Risk Assessment  Principal Problem: Panic attacks Discharge Diagnoses: Principal Problem:   Panic attacks   Reason for Admission: Nancy Hurst is a 32 y.o., female with a past psychiatric history significant for panic attacks who presents to the [facility] Voluntary from  [facility] Emergency Department for evaluation and management of worsening panic attacks and concern for suicidal thoughts.   Hospital Summary During the patient's hospitalization, patient had extensive initial psychiatric evaluation, and follow-up psychiatric evaluations every day.   Psychiatric diagnoses provided upon initial assessment: Principal Problem:   Panic attacks    The following medications were managed:   acetaminophen, alum & mag hydroxide-simeth, bismuth subsalicylate, busPIRone, ondansetron, polyethylene glycol, senna  traZODone  100 mg Oral QHS    Nancy Hurst is a 32 y.o., female with a past psychiatric history significant for panic attacks who presents to the [facility] Voluntary from  [facility] Emergency Department for evaluation and management of worsening panic attacks and concern for suicidal thoughts.    Gradually, patient started adjusting to milieu. The patient was evaluated each day by a clinical provider to ascertain response to treatment. Improvement was noted by the patient's report of decreasing symptoms, improved sleep and appetite, affect, medication tolerance, behavior, and participation in unit programming.  Patient was asked each day to complete a self inventory noting mood, mental status, pain, new symptoms, anxiety and concerns.     Symptoms were reported as significantly decreased or resolved completely by discharge.    On day of discharge, the patient reports that their mood is stable. The patient denied having suicidal thoughts for more than 48 hours prior to discharge.  Patient denies having  homicidal thoughts.  Patient denies having auditory hallucinations.  Patient denies any visual hallucinations or other symptoms of psychosis. The patient was motivated to continue taking medication with a goal of continued improvement in mental health.    The patient reports their target psychiatric symptoms of panic attack responded well to the psychiatric medications, and the patient reports overall benefit other psychiatric hospitalization. Supportive psychotherapy was provided to the patient. The patient also participated in regular group therapy while hospitalized. Coping skills, problem solving as well as relaxation therapies were also part of the unit programming.   Labs were reviewed with the patient, and abnormal results were discussed with the patient.   The patient is able to verbalize their individual safety plan to this provider.   Behavioral Events: none   Restraints: none   # It is recommended to the patient to continue psychiatric medications as prescribed, after discharge from the hospital.     # It is recommended to the patient to follow up with your outpatient psychiatric provider and PCP.   # It was discussed with the patient, the impact of alcohol, drugs, tobacco have been there overall psychiatric and medical wellbeing, and total abstinence from substance use was recommended to the patient.   # Prescriptions provided or sent directly to preferred pharmacy at discharge. Patient agreeable to plan. Given opportunity to ask questions. Appears to feel comfortable with discharge.    # In the event of worsening symptoms, the patient is instructed to call the crisis hotline, 911 and or go to the nearest ED for appropriate evaluation and treatment of symptoms. To follow-up with primary care provider for other medical issues, concerns and or health care needs   # Patient was discharged home with a plan to follow up as noted below.   #  Counseled patient and her husband that she may  benefit from OTC melatonin  Total Time spent with patient: 2 hours  Psychiatric Specialty Exam: Presentation  General Appearance: Appropriate for Environment   Eye Contact:Fair   Speech:Clear and Coherent   Speech Volume:Normal   Handedness:Right     Mood and Affect  Mood:Anxious   Affect:Congruent     Thought Process  Thought Processes:Coherent   Descriptions of Associations:Intact   Orientation:Full (Time, Place and Person)   Thought Content:WDL   Hallucinations:No data recorded Ideas of Reference:None   Suicidal Thoughts:No data recorded Homicidal Thoughts:No data recorded   Sensorium  Memory:Recent Good; Immediate Good; Remote Good   Judgment:Fair   Insight:Fair     Executive Functions  Concentration:Good   Attention Span:Good   Recall:Good   Fund of Knowledge:Good   Language:Good     Psychomotor Activity  Psychomotor Activity:No data recorded Assets  Assets:Communication Skills; Desire for Improvement; Social Support   Sleep  Sleep:No data recorded   ROS and Physical Exam Review of Systems  Constitutional: Negative.   Respiratory: Negative.    Cardiovascular: Negative.   Gastrointestinal: Negative.   Genitourinary: Negative.       Blood pressure 107/77, pulse (!) 102, temperature 98.6 F (37 C), temperature source Oral, resp. rate 18, height 5\' 1"  (1.549 m), weight 49.1 kg, SpO2 100 %. Body mass index is 20.44 kg/m. Physical Exam HENT:     Head: Normocephalic.  Pulmonary:     Effort: Pulmonary effort is normal.  Neurological:     General: No focal deficit present.     Mental Status: She is alert.   Mental Status Per Nursing Assessment::   On Admission:  NA  Demographic Factors:  NA  Loss Factors: NA  Historical Factors: NA  Risk Reduction Factors:   Sense of responsibility to family, Living with another person, especially a relative, Positive social support, and Positive coping skills or problem solving  skills  Continued Clinical Symptoms:  Panic Attacks  Cognitive Features That Contribute To Risk:  None    Suicide Risk:  Acute Risk: Minimal: No identifiable suicidal ideation. Patients presenting with no risk factors but with morbid ruminations; may be classified as minimal risk based on the severity of the depressive symptoms   Chronic Risk: Minimal: No identifiable suicidal ideation. Patients presenting with no risk factors but with morbid ruminations; may be classified as minimal risk based on the severity of the depressive symptoms    Follow-up Information     [facility]. Go on 09/07/2022.   Specialty: Behavioral Health Why: You have an appointment for medication management services on 09/07/22 at 8:00 am, in person.  You also have an appointment for therapy services on 09/28/22 at 1:00 pm, in person.   *You may also go to this provider at walk in time of 7:00 am, Monday through Friday if you wish to be seen sooner.  (After 2 no shows, you may only use walk-in services)  Interpretation services are available. Contact information: [address] [place] [address] [phone]                Plan Of Care/Follow-up recommendations:  Activity: as tolerated  Diet: heart healthy  Other: -Follow-up with your outpatient psychiatric provider -instructions on appointment date, time, and address (location) are provided to you in discharge paperwork.  -Take your psychiatric medications as prescribed at discharge - instructions are provided to you in the discharge paperwork  -Follow-up with outpatient primary  care doctor and other specialists -for management of chronic medical disease, including: health maintenance checks  -Testing: Follow-up with outpatient provider for abnormal lab results: none  -Recommend abstinence from alcohol, tobacco, and other illicit drug use at discharge.   -If your psychiatric symptoms recur, worsen, or if you  have side effects to your psychiatric medications, call your outpatient psychiatric provider, 911, [phone] or go to the nearest emergency department.  -If suicidal thoughts recur, call your outpatient psychiatric provider, 911, [phone] or go to the nearest emergency department.  Signed: Augusto Gamble, MD 08/06/2022, 1:18 PM

## 2022-08-06 NOTE — Progress Notes (Addendum)
Patient denies SI, HI and AVH and expresses eagerness to be discharged. Patient is anxious but pleasant and calm. Denies any depression. Patient remains safe on the unit, Q 15 minute safety checks are ongoing.   08/06/22 1000  Psych Admission Type (Psych Patients Only)  Admission Status Voluntary/72 hour document signed  Date 72 hour document signed  08/05/22  Time 72 hour document signed  1912  Psychosocial Assessment  Patient Complaints Anxiety  Eye Contact Brief  Facial Expression Anxious;Worried  Affect Sad  Sport and exercise psychologist Cooperative;Appropriate to situation  Mood Anxious  Thought Process  Coherency WDL  Content WDL  Delusions None reported or observed  Perception WDL  Hallucination None reported or observed  Judgment WDL  Confusion None  Danger to Self  Current suicidal ideation? Denies  Danger to Others  Danger to Others None reported or observed

## 2022-08-06 NOTE — BH IP Treatment Plan (Signed)
Interdisciplinary Treatment and Diagnostic Plan   08/06/2022 Time of Session: 1200 Nancy Hurst MRN: [id number]  Principal Diagnosis: Panic attacks  Secondary Diagnoses: Principal Problem:   Panic attacks   Current Medications:  Current Facility-Administered Medications  Medication Dose Route Frequency Provider Last Rate Last Admin   acetaminophen (TYLENOL) tablet 650 mg  650 mg Oral Q6H PRN Augusto Gamble, MD       alum & mag hydroxide-simeth (MAALOX/MYLANTA) 200-200-20 MG/5ML suspension 30 mL  30 mL Oral Q4H PRN Augusto Gamble, MD       bismuth subsalicylate (PEPTO BISMOL) chewable tablet 524 mg  524 mg Oral Q3H PRN Augusto Gamble, MD       busPIRone (BUSPAR) tablet 10 mg  10 mg Oral TID PRN Augusto Gamble, MD       ondansetron [facility]) tablet 8 mg  8 mg Oral Q8H PRN Augusto Gamble, MD       polyethylene glycol (MIRALAX / GLYCOLAX) packet 17 g  17 g Oral Daily PRN Augusto Gamble, MD       senna (SENOKOT) tablet 8.6 mg  1 tablet Oral QHS PRN Augusto Gamble, MD       traZODone (DESYREL) tablet 100 mg  100 mg Oral QHS Augusto Gamble, MD       PTA Medications: Medications Prior to Admission  Medication Sig Dispense Refill Last Dose   acetaminophen (TYLENOL) 500 MG tablet Take 500-1,000 mg by mouth every 6 (six) hours as needed for headache or mild pain.      amoxicillin (AMOXIL) 500 MG capsule Take 500 mg by mouth 3 (three) times daily.      diclofenac Sodium (VOLTAREN) 1 % GEL Apply 2 g topically 4 (four) times daily. (Patient not taking: Reported on 08/04/2022) 100 g 0    omeprazole (PRILOSEC) 20 MG capsule Take 1 capsule (20 mg total) by mouth daily. (Patient not taking: Reported on 08/04/2022) 30 capsule 0    UNKNOWN TO PATIENT Inject 1 Syringe into the skin See admin instructions. Unknown to patient- Inject one syringe into the skin once "as needed for anxiety"       Patient Stressors: Other: emotional    Patient Strengths: Ability for insight  Supportive family/friends   Treatment  Modalities: Medication Management, Group therapy, Case management,  1 to 1 session with clinician, Psychoeducation, Recreational therapy.   Physician Treatment Plan for Primary Diagnosis: Panic attacks Long Term Goal(s):     Short Term Goals:    Medication Management: Evaluate patient's response, side effects, and tolerance of medication regimen.  Therapeutic Interventions: 1 to 1 sessions, Unit Group sessions and Medication administration.  Evaluation of Outcomes: Progressing  Physician Treatment Plan for Secondary Diagnosis: Principal Problem:   Panic attacks  Long Term Goal(s):     Short Term Goals:       Medication Management: Evaluate patient's response, side effects, and tolerance of medication regimen.  Therapeutic Interventions: 1 to 1 sessions, Unit Group sessions and Medication administration.  Evaluation of Outcomes: Progressing   RN Treatment Plan for Primary Diagnosis: Panic attacks Long Term Goal(s): Knowledge of disease and therapeutic regimen to maintain health will improve  Short Term Goals: Ability to remain free from injury will improve, Ability to verbalize frustration and anger appropriately will improve, Ability to demonstrate self-control, Ability to participate in decision making will improve, Ability to verbalize feelings will improve, Ability to disclose and discuss suicidal ideas, Ability to identify and develop effective coping behaviors will improve, and Compliance with prescribed medications will  improve  Medication Management: RN will administer medications as ordered by provider, will assess and evaluate patient's response and provide education to patient for prescribed medication. RN will report any adverse and/or side effects to prescribing provider.  Therapeutic Interventions: 1 on 1 counseling sessions, Psychoeducation, Medication administration, Evaluate responses to treatment, Monitor vital signs and CBGs as ordered, Perform/monitor CIWA,  COWS, AIMS and Fall Risk screenings as ordered, Perform wound care treatments as ordered.  Evaluation of Outcomes: Progressing   LCSW Treatment Plan for Primary Diagnosis: Panic attacks Long Term Goal(s): Safe transition to appropriate next level of care at discharge, Engage patient in therapeutic group addressing interpersonal concerns.  Short Term Goals: Engage patient in aftercare planning with referrals and resources, Increase social support, Increase ability to appropriately verbalize feelings, Increase emotional regulation, Facilitate acceptance of mental health diagnosis and concerns, Facilitate patient progression through stages of change regarding substance use diagnoses and concerns, Identify triggers associated with mental health/substance abuse issues, and Increase skills for wellness and recovery  Therapeutic Interventions: Assess for all discharge needs, 1 to 1 time with Social worker, Explore available resources and support systems, Assess for adequacy in community support network, Educate family and significant other(s) on suicide prevention, Complete Psychosocial Assessment, Interpersonal group therapy.  Evaluation of Outcomes: Progressing   Progress in Treatment: Attending groups: Yes. Participating in groups: Yes. Taking medication as prescribed: Yes. Toleration medication: Yes. Family/Significant other contact made: Yes, individual(s) contacted:  Spouse Patient understands diagnosis: No. Discussing patient identified problems/goals with staff: Yes. Medical problems stabilized or resolved: Yes. Denies suicidal/homicidal ideation: Yes. Issues/concerns per patient self-inventory: Yes. Other: N/A  New problem(s) identified: No, Describe:  None reported  New Short Term/Long Term Goal(s): medication stabilization, elimination of SI thoughts, development of comprehensive mental wellness plan.   Patient Goals:  Medication Stabilization  Discharge Plan or Barriers: :   Patient recently admitted. CSW will continue to follow and assess for appropriate referrals and possible discharge planning.   Reason for Continuation of Hospitalization: Anxiety Medication stabilization  Estimated Length of Stay: 3-7 Days  Last 3 [place] Suicide Severity Risk Score: Flowsheet Row Ad[facility] (Current) [facility] from 08/04/2022 in [facility] from 08/03/2022 in [facility] at [facility]  C-SSRS RISK CATEGORY No Risk No Risk No Risk       Last PHQ 2/9 Scores:     No data to display          medication stabilization, elimination of SI thoughts, development of comprehensive mental wellness plan.    Scribe for Treatment Team: Ane Payment, LCSW 08/06/2022 1:48 PM

## 2022-08-06 NOTE — H&P (Signed)
Psychiatric Admission Assessment Adult  Patient Identification: Nancy Hurst MRN:  [id number] Date of Evaluation:  08/06/2022  Chief Complaint:  Generalized anxiety disorder [F41.1],  Panic attacks  Principal Problem:   Panic attacks   History of Present Illness:  Nancy Hurst is a 32 y.o., female with a past psychiatric history significant for panic attacks no significant past medical history who presents to the [facility] Voluntary from  [facility] Emergency Department for evaluation and management of panic attacks and concern for suicidal ideation.   Patient says she was looking for an "anxiety pill" and came to the hospital. She did not know she was going to be admitted. She says her family does not agree about this admission and she does not agree either. She feels she should not even be here. At the emergency department, she was asked if she was experiencing suicidal thoughts and she denied ever experiencing any. When asked how she came to sign the voluntary admission paperwork, she says she was made to sign it even if she did not want to.  Patient tells me her anxiety started on Monday (08/02/2022) after she experienced stress from increasing responsibilities at work. When describing her stress, she says she has been experiencing severe anxiety, chest palpitations, feeling like she was going to die, and difficulty sleeping. Prior to this event, she has not experienced such a sensation before. Patient said she received treatment for it but does not remember what she was given. Since Monday, she denies having had another one of these events. She says she does worry a lot and it was related to a family member getting into an accident last month - it causes her to sleep poorly. She denies any nightmares.  With regards to her mood, she says she has not felt sad in the past two months. She was able to do what she enjoys doing during this time. She  denies any prior episodes of elevated mood, impulsivity, or racing thoughts.  She denies a history of not wanting to be alive or wanting to end her life. She denies AVH.  Chart review: On chart review, prior to this evaluation, there are conflicting reports about patient endorsing passive SI but in the same sentence denying any thoughts of not wanting to be alive.  Subjective Sleep past 24 hours: good Subjective Appetite past 24 hours: good  Collateral information obtained [name], patient's partner) [name] was interviewed on the phone in the presence of in-person interpreter Shanda Bumps.  [name] says he does not know why she was admitted. He says he took patient to emergency room because she was "shaking" - she was nervous, supposedly from the anxiety. This started on Sunday (08/01/2022). He says this also happened last year where she was shaking, but this time is significantly worse. He also confirms patient's account of frequent worry about her nephew in [place] who almost got into an accident.  He says she was not endorsing any suicidal thoughts or not wanting to be alive. The prior two months, patient did not appear depressed or acting differently than how she usually was behaving.  [name] says she has work Monday through Friday. She has not worked for a week.  He says there are no guns at home.  He came by yesterday (08/05/2022) and says she was fine. He is very worried about why patient was admitted.  Past Psychiatric History:  Previous psych diagnoses: Denies Prior inpatient psychiatric treatment: Denies Current/prior outpatient psychiatric treatment: Denies Current psychiatric  provider: Denies  Current therapist: Denies Psychotherapy hx: Denies  History of suicide attempts: Denies History of homicide: Denies  Psychotropic medications: Current Can't remember any medicines she takes ("names are too big")  Past Can't remember  Allergies: patient has no known allergies  Substance  Use History: Alcohol: never drinks  --------  Tobacco: never smoked, vaped, or chewed tobacco Marijuana: denies Cocaine: denies Methamphetamines: denies Psilocybin: denies MDMA: denies Ecstasy: denies Opiates: denies Benzodiazepines: denies IV drug use: denies Prescribed meds abuse: denies  History of detox: N/A History of rehab: N/A  Is the patient at risk to self? No Has the patient been a risk to self in the past 6 months? No Has the patient been a risk to self within the distant past? No Is the patient a risk to others? No Has the patient been a risk to others in the past 6 months? No Has the patient been a risk to others within the distant past? No  Alcohol Screening:   Tobacco Screening:    Substance Abuse History in the last 12 months: No  Past Medical/Surgical History:  Medical Diagnoses: none Home Rx: none Prior Hosp: none Prior Surgeries / non-head trauma: appendectomy 5 years ago  Head trauma: denies LOC: denies Concussions: denies Seizures: denies  Last menstrual period and contraceptives:  07/28/2022, not on any contraceptives  Family History:  Medical: none Psych: none Psych Rx: unknown Suicide: none Homicide: none Substance use family hx: none  Social History:  Place of birth and grew up where: born in [place], [place], grew up in [place] Abuse: no history of abuse Marital Status: partnered Sexual orientation: straight Children: no children Employment: employed at [employer] level of education:  finished junior high Housing: living with partner Finances:  income Legal: no Special educational needs teacher: never served Consulting civil engineer: denies Pills stockpile: denies  Lab Results:  Results for orders placed or performed during the hospital encounter of 08/04/22 (from the past 48 hour(s))  Comprehensive metabolic panel     Status: Abnormal   Collection Time: 08/04/22  6:30 PM  Result Value Ref Range   Sodium 139 135 - 145 mmol/L    Potassium 3.3 (L) 3.5 - 5.1 mmol/L   Chloride 106 98 - 111 mmol/L   CO2 22 22 - 32 mmol/L   Glucose, Bld 115 (H) 70 - 99 mg/dL    Comment: Glucose reference range applies only to samples taken after fasting for at least 8 hours.   BUN 14 6 - 20 mg/dL   Creatinine, Ser 6.64 0.44 - 1.00 mg/dL   Calcium 9.2 8.9 - 40.3 mg/dL   Total Protein 8.7 (H) 6.5 - 8.1 g/dL   Albumin 4.7 3.5 - 5.0 g/dL   AST 17 15 - 41 U/L   ALT 15 0 - 44 U/L   Alkaline Phosphatase 41 38 - 126 U/L   Total Bilirubin 0.8 0.3 - 1.2 mg/dL   GFR, Estimated >47 >42 mL/min    Comment: (NOTE) Calculated using the CKD-EPI Creatinine Equation (2021)    Anion gap 11 5 - 15    Comment: Performed at [facility], [address] [place], [place] [address]  Ethanol     Status: None   Collection Time: 08/04/22  6:30 PM  Result Value Ref Range   Alcohol, Ethyl (B) <10 <10 mg/dL    Comment: (NOTE) Lowest detectable limit for serum alcohol is 10 mg/dL.  For medical purposes only. Performed at [facility], [address] [place], [place] [address]   CBC with Diff     Status: None   Collection Time: 08/04/22  6:30 PM  Result Value Ref Range   WBC 6.1 4.0 - 10.5 K/uL   RBC 4.17 3.87 - 5.11 MIL/uL   Hemoglobin 13.1 12.0 - 15.0 g/dL   HCT 78.2 95.6 - 21.3 %   MCV 93.8 80.0 - 100.0 fL   MCH 31.4 26.0 - 34.0 pg   MCHC 33.5 30.0 - 36.0 g/dL   RDW 08.6 57.8 - 46.9 %   Platelets 369 150 - 400 K/uL   nRBC 0.0 0.0 - 0.2 %   Neutrophils Relative % 60 %   Neutro Abs 3.7 1.7 - 7.7 K/uL   Lymphocytes Relative 33 %   Lymphs Abs 2.0 0.7 - 4.0 K/uL   Monocytes Relative 7 %   Monocytes Absolute 0.4 0.1 - 1.0 K/uL   Eosinophils Relative 0 %   Eosinophils Absolute 0.0 0.0 - 0.5 K/uL   Basophils Relative 0 %   Basophils Absolute 0.0 0.0 - 0.1 K/uL   Immature Granulocytes 0 %   Abs Immature Granulocytes 0.01 0.00 - 0.07 K/uL    Comment: Performed at [facility], [address] [place], [place] [address]  hCG, serum, qualitative     Status: None   Collection Time: 08/04/22  6:30 PM  Result Value Ref Range   Preg, Serum NEGATIVE NEGATIVE    Comment:        THE SENSITIVITY OF THIS METHODOLOGY IS >10 mIU/mL. Performed at [facility], [address], [place], [place] [address]   Urine rapid drug screen (hosp performed)     Status: None   Collection Time: 08/04/22  6:31 PM  Result Value Ref Range   Opiates NONE DETECTED NONE DETECTED   Cocaine NONE DETECTED NONE DETECTED   Benzodiazepines NONE DETECTED NONE DETECTED   Amphetamines NONE DETECTED NONE DETECTED   Tetrahydrocannabinol NONE DETECTED NONE DETECTED   Barbiturates NONE DETECTED NONE DETECTED    Comment: (NOTE) DRUG SCREEN FOR MEDICAL PURPOSES ONLY.  IF CONFIRMATION IS NEEDED FOR ANY PURPOSE, NOTIFY LAB WITHIN 5 DAYS.  LOWEST DETECTABLE LIMITS FOR URINE DRUG SCREEN Drug Class                     Cutoff (ng/mL) Amphetamine and metabolites    1000 Barbiturate and metabolites    200 Benzodiazepine                 200 Opiates and metabolites        300 Cocaine and metabolites        300 THC                            50 Performed at [facility], [address] [place], [place] [address]     Blood Alcohol level:  Lab Results  Component Value Date   ETH <10 08/04/2022    Metabolic Disorder Labs:  No results found for: "HGBA1C", "MPG" No results found for: "PROLACTIN" No results found for: "CHOL", "TRIG", "HDL", "CHOLHDL", "VLDL", "LDLCALC"  Current Medications: Current Facility-Administered Medications  Medication Dose Route Frequency Provider Last Rate Last Admin   acetaminophen (TYLENOL) tablet 650 mg  650 mg Oral Q6H PRN Augusto Gamble, MD       alum & mag hydroxide-simeth (MAALOX/MYLANTA) 200-200-20 MG/5ML suspension 30 mL  30 mL  Oral Q4H PRN Augusto Gamble, MD       bismuth subsalicylate (PEPTO BISMOL) chewable tablet 524 mg  524 mg Oral  Q3H PRN Augusto Gamble, MD       busPIRone (BUSPAR) tablet 10 mg  10 mg Oral TID PRN Augusto Gamble, MD       ondansetron [facility]) tablet 8 mg  8 mg Oral Q8H PRN Augusto Gamble, MD       polyethylene glycol (MIRALAX / GLYCOLAX) packet 17 g  17 g Oral Daily PRN Augusto Gamble, MD       senna (SENOKOT) tablet 8.6 mg  1 tablet Oral QHS PRN Augusto Gamble, MD       traZODone (DESYREL) tablet 100 mg  100 mg Oral QHS Augusto Gamble, MD        PTA Medications: Medications Prior to Admission  Medication Sig Dispense Refill Last Dose   acetaminophen (TYLENOL) 500 MG tablet Take 500-1,000 mg by mouth every 6 (six) hours as needed for headache or mild pain.      amoxicillin (AMOXIL) 500 MG capsule Take 500 mg by mouth 3 (three) times daily.      diclofenac Sodium (VOLTAREN) 1 % GEL Apply 2 g topically 4 (four) times daily. (Patient not taking: Reported on 08/04/2022) 100 g 0    omeprazole (PRILOSEC) 20 MG capsule Take 1 capsule (20 mg total) by mouth daily. (Patient not taking: Reported on 08/04/2022) 30 capsule 0    UNKNOWN TO PATIENT Inject 1 Syringe into the skin See admin instructions. Unknown to patient- Inject one syringe into the skin once "as needed for anxiety"       Physical Findings: AIMS: No  CIWA:    COWS:     Psychiatric Specialty Exam: Presentation  General Appearance:  Appropriate for Environment  Eye Contact: Fair  Speech: Clear and Coherent  Speech Volume: Normal  Handedness: Right   Mood and Affect  Mood: Anxious  Affect: Congruent   Thought Process  Thought Processes: Coherent  Descriptions of Associations:Intact  Orientation:Full (Time, Place and Person)  Thought Content:WDL  Hallucinations:No data recorded Ideas of Reference:None  Suicidal Thoughts:No data recorded Homicidal Thoughts:No data recorded  Sensorium  Memory: Recent Good; Immediate Good; Remote Good  Judgment: Fair  Insight: Fair   Art therapist  Concentration: Good  Attention  Span: Good  Recall: Good  Fund of Knowledge: Good  Language: Good   Psychomotor Activity  Psychomotor Activity:No data recorded  Assets  Assets: Communication Skills; Desire for Improvement; Social Support   Sleep  Sleep:No data recorded  ROS and Physical Exam Review of Systems  Constitutional: Negative.   Respiratory: Negative.    Cardiovascular: Negative.   Gastrointestinal: Negative.   Genitourinary: Negative.     Blood pressure 107/77, pulse (!) 102, temperature 98.6 F (37 C), temperature source Oral, resp. rate 18, height 5\' 1"  (1.549 m), weight 49.1 kg, SpO2 100 %. Body mass index is 20.44 kg/m. Physical Exam HENT:     Head: Normocephalic.  Pulmonary:     Effort: Pulmonary effort is normal.  Neurological:     General: No focal deficit present.     Mental Status: She is alert.     Assets  Assets:Communication Skills; Desire for Improvement; Social Support   Treatment Plan Summary: Daily contact with patient to assess and evaluate symptoms and progress in treatment and medication management  ASSESSMENT: Panic attacks  Based on my evaluation of the patient and after obtaining collateral, I do not think the patient  is a danger to herself or others.  The symptoms she is describing definitely meets criteria for panic attacks causing her poor sleep and will require treatment, but patient at this time is not interested in inpatient hospitalization.  Her consistent denial of suicidal or homicidal ideations and confirmed by the husband is inconsistent with the rationale for her admission.  Furthermore, documentation at [facility] emergency department puts some doubt into the question of passive suicidal ideation.  Patient does not meet IVC criteria at this time.  She is not passively or actively suicidal and can be safely discharged today with resources.  PLAN: Safety and Monitoring:  -- Voluntary admission to inpatient psychiatric unit for safety,  stabilization and treatment  -- Daily contact with patient to assess and evaluate symptoms and progress in treatment  -- Patient's case to be discussed in multi-disciplinary team meeting  -- Observation Level : q15 minute checks  -- Vital signs: q12 hours  -- Precautions: suicide, elopement, and assault  2. Interventions (medications, psychoeducation, etc):              -- start trazodone 100 mg at bedtime for insomnia  -- Patient does not need nicotine replacement  PRN medications for symptomatic management:              -- start acetaminophen 650 mg every 6 hours as needed for mild to moderate pain, fever, and headaches              -- start buspirone 10 mg three times a day as needed for anxiety              -- start bismuth subsalicylate 524 mg oral chewable tablet every 3 hours as needed for diarrhea / loose stools              -- start senna 8.6 mg oral at bedtime and polyethylene glycol 17 g oral daily as needed for mild to moderate constipation              -- start ondansetron 8 mg every 8 hours as needed for nausea or vomiting              -- start aluminum-magnesium hydroxide + simethicone 30 mL every 4 hours as needed for heartburn or indigestion  -- As needed agitation protocol in-place  The risks/benefits/side-effects/alternatives to the above medication were discussed in detail with the patient and time was given for questions. The patient consents to medication trial. FDA black box warnings, if present, were discussed.  The patient is agreeable with the medication plan, as above. We will monitor the patient's response to pharmacologic treatment, and adjust medications as necessary.  3. Routine and other pertinent labs: EKG monitoring: QTc: 422 (08/04/2022)  Metabolism / endocrine: BMI: Body mass index is 20.44 kg/m. Prolactin: No results found for: "PROLACTIN" Lipid Panel: No results found for: "CHOL", "TRIG", "HDL", "CHOLHDL", "VLDL", "LDLCALC" HbgA1c: No results  found for: "HGBA1C" TSH: TSH (uIU/mL)  Date Value  08/02/2022 0.489    Drugs of Abuse     Component Value Date/Time   LABOPIA NONE DETECTED 08/04/2022 1831   COCAINSCRNUR NONE DETECTED 08/04/2022 1831   LABBENZ NONE DETECTED 08/04/2022 1831   AMPHETMU NONE DETECTED 08/04/2022 1831   THCU NONE DETECTED 08/04/2022 1831   LABBARB NONE DETECTED 08/04/2022 1831     4. Group Therapy:  -- Encouraged patient to participate in unit milieu and in scheduled group therapies   -- Short Term Goals: Ability to  identify changes in lifestyle to reduce recurrence of condition, verbalize feelings, identify and develop effective coping behaviors, maintain clinical measurements within normal limits, and identify triggers associated with substance abuse/mental health issues will improve. Improvement in ability to demonstrate self-control and comply with prescribed medications.  -- Long Term Goals: Improvement in symptoms so as ready for discharge -- Patient is encouraged to participate in group therapy while admitted to the psychiatric unit. -- We will address other chronic and acute stressors, which contributed to the patient's Panic attacks in order to reduce the risk of self-harm at discharge.  5. Discharge Planning:   -- Social work and case management to assist with discharge planning and identification of hospital follow-up needs prior to discharge  -- Estimated LOS: 5-7 days  -- Discharge Concerns: Need to establish a safety plan; Medication compliance and effectiveness  -- Discharge Goals: Return home with outpatient referrals for mental health follow-up including medication management/psychotherapy  I certify that inpatient services furnished can reasonably be expected to improve the patient's condition.    I discussed my assessment, planned testing and intervention for the patient with Dr. Abbott Pao who agrees with my formulated course of action.  Signed: Augusto Gamble, MD 08/06/2022, 2:09 PM

## 2022-08-06 NOTE — Progress Notes (Signed)
Discharge Note:  Patient denies SI/HI/AVH at this time. Discharge instructions, AVS, prescriptions, and transition record gone over with patient. Patient agrees to comply with medication management, follow-up visit, and outpatient therapy. Patient belongings returned to patient. Patient questions and concerns addressed and answered. Patient ambulatory off unit. Patient discharged to home with husband. 

## 2022-08-06 NOTE — Progress Notes (Signed)
   08/05/22 2000  Psych Admission Type (Psych Patients Only)  Admission Status Voluntary/72 hour document signed  Date 72 hour document signed  08/05/22  Psychosocial Assessment  Patient Complaints Anxiety;Depression  Eye Contact Brief  Facial Expression Worried  Affect Sad  Speech Logical/coherent  Interaction Assertive  Motor Activity Other (Comment)  Appearance/Hygiene Unremarkable  Behavior Characteristics Cooperative;Appropriate to situation  Mood Sad  Thought Process  Coherency WDL  Content WDL  Delusions WDL  Perception WDL  Hallucination None reported or observed  Judgment WDL  Confusion WDL  Danger to Self  Current suicidal ideation? Denies  Danger to Others  Danger to Others None reported or observed

## 2022-08-06 NOTE — BHH Counselor (Addendum)
Adult Comprehensive Assessment  Patient ID: Nancy Hurst, female   DOB: 1990/04/29, 32 y.o.   MRN: [id number]  Information Source: Information source: Patient and Chart Review   Summary/Recommendations:   Summary and Recommendations (to be completed by the evaluator): Per chart review [name] is a 32 y.o. female admitted to [facility] secondary to presenting to [facility] ED for anxiety patient reported that it started Sunday. Patient stated she felt nervous and anxious and had feelings that she was going to die.  She felt tightness in her throat pain and weakness in her legs. Patient reported that her primary concern is anxiety and nervousness due to her place of employment being hard work. Patient reported that her responsibilities at work have increased and at times have to do the work for others. Patient denies other stressors. This is patient's first psych admission. Patient denies substance use. Patient denies SI/HI/AVH. Patient lives with her husband and reports having no children. Patient will be discharged with new providers for therapy and medication management.  Veva Holes LCSWA . 08/06/2022

## 2022-08-06 NOTE — BHH Suicide Risk Assessment (Signed)
BHH INPATIENT:  Family/Significant Other Suicide Prevention Education  Suicide Prevention Education:  Education Completed;   Hernandez,Luis (Significant other) [phone]  ,  (name of family member/significant other) has been identified by the patient as the family member/significant other with whom the patient will be residing, and identified as the person(s) who will aid the patient in the event of a mental health crisis (suicidal ideations/suicide attempt).  With written consent from the patient, the family member/significant other has been provided the following suicide prevention education, prior to the and/or following the discharge of the patient.  The suicide prevention education provided includes the following: Suicide risk factors Suicide prevention and interventions National Suicide Hotline telephone number [facility] assessment telephone number [facility] Emergency Assistance 911 [facility] and/or Residential Mobile Crisis Unit telephone number  Request made of family/significant other to: Remove weapons (e.g., guns, rifles, knives), all items previously/currently identified as safety concern.   Remove drugs/medications (over-the-counter, prescriptions, illicit drugs), all items previously/currently identified as a safety concern.  The family member/significant other verbalizes understanding of the suicide prevention education information provided. The family member reported that there are no guns in the home. The family member/significant other agrees to remove the items of safety concern listed above.  Veva Holes, LCSWA  08/06/2022, 12:16 PM

## 2022-08-07 ENCOUNTER — Emergency Department (HOSPITAL_COMMUNITY)
Admission: EM | Admit: 2022-08-07 | Discharge: 2022-08-07 | Disposition: A | Discharge status: Home / Self Care | Payer: Self-pay | Attending: Emergency Medicine | Admitting: Emergency Medicine

## 2022-08-07 ENCOUNTER — Encounter (HOSPITAL_COMMUNITY): Payer: Self-pay | Admitting: Emergency Medicine

## 2022-08-07 ENCOUNTER — Other Ambulatory Visit: Payer: Self-pay

## 2022-08-07 DIAGNOSIS — F419 Anxiety disorder, unspecified: Secondary | ICD-10-CM | POA: Insufficient documentation

## 2022-08-07 DIAGNOSIS — F410 Panic disorder [episodic paroxysmal anxiety] without agoraphobia: Secondary | ICD-10-CM | POA: Insufficient documentation

## 2022-08-07 DIAGNOSIS — R197 Diarrhea, unspecified: Secondary | ICD-10-CM | POA: Insufficient documentation

## 2022-08-07 HISTORY — DX: Anxiety disorder, unspecified: F41.9

## 2022-08-07 LAB — COMPREHENSIVE METABOLIC PANEL
ALT: 12 U/L (ref 0–44)
AST: 16 U/L (ref 15–41)
Albumin: 4.4 g/dL (ref 3.5–5.0)
Alkaline Phosphatase: 37 U/L — ABNORMAL LOW (ref 38–126)
Anion gap: 10 (ref 5–15)
BUN: 9 mg/dL (ref 6–20)
CO2: 20 mmol/L — ABNORMAL LOW (ref 22–32)
Calcium: 9.2 mg/dL (ref 8.9–10.3)
Chloride: 105 mmol/L (ref 98–111)
Creatinine, Ser: 0.52 mg/dL (ref 0.44–1.00)
GFR, Estimated: >60 mL/min (ref >60)
Glucose, Bld: 101 mg/dL — ABNORMAL HIGH (ref 70–99)
Potassium: 4.1 mmol/L (ref 3.5–5.1)
Sodium: 135 mmol/L (ref 135–145)
Total Bilirubin: 1 mg/dL (ref 0.3–1.2)
Total Protein: 8.1 g/dL (ref 6.5–8.1)

## 2022-08-07 LAB — CBC
HCT: 38.6 % (ref 36.0–46.0)
Hemoglobin: 13.1 g/dL (ref 12.0–15.0)
MCH: 31 pg (ref 26.0–34.0)
MCHC: 33.9 g/dL (ref 30.0–36.0)
MCV: 91.3 fL (ref 80.0–100.0)
Platelets: 377 10*3/uL (ref 150–400)
RBC: 4.23 MIL/uL (ref 3.87–5.11)
RDW: 13.5 % (ref 11.5–15.5)
WBC: 7.4 10*3/uL (ref 4.0–10.5)
nRBC: 0 % (ref 0.0–0.2)

## 2022-08-07 MED ORDER — LORAZEPAM 1 MG PO TABS
1.0000 mg | ORAL_TABLET | Freq: Once | ORAL | Status: AC
  Administered 2022-08-07: 1 mg via ORAL
  Filled 2022-08-07: qty 1

## 2022-08-07 MED ORDER — ALPRAZOLAM 0.25 MG PO TABS: 0.2500 mg | ORAL_TABLET | Freq: Every evening | ORAL | 0 refills | Status: AC | PRN

## 2022-08-07 MED ORDER — ONDANSETRON 4 MG PO TBDP
4.0000 mg | ORAL_TABLET | Freq: Once | ORAL | Status: AC
  Administered 2022-08-07: 4 mg via ORAL
  Filled 2022-08-07: qty 1

## 2022-08-07 NOTE — Discharge Instructions (Signed)
You can go to Behavioral Care Urgent Care for further management of your acute anxiety.   [facility] address: [address] [place] [address]  Phone number: [phone]  Solicite ayuda de inmediato si: Siente que tiene el corazn acelerado. Le falta el aire. Piensa acerca de lastimarse o lastimar a Economist.

## 2022-08-07 NOTE — ED Provider Triage Note (Signed)
Emergency Medicine Provider Triage Evaluation Note  Nancy Hurst , a 32 y.o. female  was evaluated in triage.  Pt complains of anxiety. Headache, body aches, stomach pain and palpitations since Monday. These symptoms are brought on by stress. Patient reports diarrhea since yesterday. Symptoms went away with the Ativan earlier, patient is requesting a prescriptions of it. She does not see her PCP until July.  Review of Systems  Positive: Headache, stomach pain, nausea, palpitations Negative: Fever, vomiting  Physical Exam  BP 107/81   Pulse 96   Temp 98.7 F (37.1 C) (Oral)   Resp 16   Ht 5\' 1"  (1.549 m)   Wt 49 kg   SpO2 99%   BMI 20.41 kg/m  Gen:   Awake, no distress   Resp:  Normal effort  MSK:   Moves extremities without difficulty  Other:  Periumbilical pain  Medical Decision Making  Medically screening exam initiated at 7:44 AM.  Appropriate orders placed.  Nancy Hurst was informed that the remainder of the evaluation will be completed by another provider, this initial triage assessment does not replace that evaluation, and the importance of remaining in the ED until their evaluation is complete.    Nancy Marion, PA-C 08/07/22 [phone]

## 2022-08-07 NOTE — Progress Notes (Signed)
  [facility] Adult Case Management Discharge Plan :  Will you be returning to the same living situation after discharge:  Yes,  home with family At discharge, do you have transportation home?: Yes,  husband Do you have the ability to pay for your medications: No.needs assistance from community agency  Release of information consent forms completed and in the chart;  Patient's signature needed at discharge.  Patient to Follow up at:  Follow-up Information     [facility]. Go on 09/07/2022.   Specialty: Behavioral Health Why: You have an appointment for medication management services on 09/07/22 at 8:00 am, in person.  You also have an appointment for therapy services on 09/28/22 at 1:00 pm, in person.   *You may also go to this provider at walk in time of 7:00 am, Monday through Friday if you wish to be seen sooner.  (After 2 no shows, you may only use walk-in services)  Interpretation services are available. Contact information: [address] [place] [address] [phone]                Next level of care provider has access to [facility]:yes  Safety Planning and Suicide Prevention discussed: Yes,  with husband     Has patient been referred to the Quitline?: Patient does not use tobacco/nicotine products  Patient has been referred for addiction treatment: No known substance use disorder.  Lynnell Chad, LCSW 08/07/2022, 8:51 AM

## 2022-08-07 NOTE — ED Triage Notes (Signed)
Pt to ED from home c/o anxiety and panic attack for the last week.  Hx of same.  Denies SI/HI.  Pt shaking and anxious during triage.

## 2022-08-07 NOTE — ED Provider Notes (Signed)
[place] EMERGENCY DEPARTMENT AT [facility] Provider Note   CSN: [id number] Arrival date & time: 08/07/22  4098     History  Chief Complaint  Patient presents with   Panic Attack    Nancy Hurst is a 32 y.o. Spanish speaking female who presents today for anxiety.  Patient reports feeling anxious for about the past 5 days.  She says that this feeling is brought on by stress, mainly regarding work.  With this she is experiencing headaches, nausea, abdominal pain, and palpitations for the past week.  She reports diarrhea since yesterday.  No fever, chest pain, or shortness of breath at this time. Patient has been seen for anxiety multiple times this week as well that is being evaluated by psychiatry within the ED.  She denies any SI or HI.   Patient was given Ativan in triage earlier this morning.  She reports that that medication has really helped her symptoms to improve and is asking if she can get more at this time.  She reports that she has an appointment to be seen by a behavioral health specialist Nancy her appointment is not until the end of July, over a month away.    An interpreter was used today to communicate with patient and her family member. Home Medications Prior to Admission medications   Medication Sig Start Date End Date Taking? Authorizing Provider  ALPRAZolam (XANAX) 0.25 MG tablet Take 1 tablet (0.25 mg total) by mouth at bedtime as needed for up to 2 days for anxiety. 08/07/22 08/09/22 Yes Maxwell Marion, PA-C      Allergies    Hydroxyzine    Review of Systems   Review of Systems  Psychiatric/Behavioral:  The patient is nervous/anxious.   All other systems reviewed and are negative.   Physical Exam Updated Vital Signs BP 110/88   Pulse 86   Temp 98.3 F (36.8 C)   Resp 12   Ht 5\' 1"  (1.549 m)   Wt 49 kg   SpO2 100%   BMI 20.41 kg/m  Physical Exam Vitals and nursing note reviewed.  Constitutional:      General: She is not in  acute distress.    Appearance: Normal appearance.  HENT:     Head: Normocephalic and atraumatic.     Mouth/Throat:     Mouth: Mucous membranes are moist.  Eyes:     Conjunctiva/sclera: Conjunctivae normal.     Pupils: Pupils are equal, round, and reactive to light.  Cardiovascular:     Rate and Rhythm: Normal rate and regular rhythm.     Pulses: Normal pulses.     Heart sounds: Normal heart sounds.  Pulmonary:     Effort: Pulmonary effort is normal.     Breath sounds: Normal breath sounds.  Abdominal:     Palpations: Abdomen is soft.     Tenderness: There is no abdominal tenderness.  Skin:    General: Skin is warm and dry.     Findings: No rash.  Neurological:     General: No focal deficit present.     Mental Status: She is alert.  Psychiatric:        Mood and Affect: Mood normal.     ED Results / Procedures / Treatments   Labs (all labs ordered are listed, Nancy only abnormal results are displayed) Labs Reviewed  COMPREHENSIVE METABOLIC PANEL - Abnormal; Notable for the following components:      Result Value   CO2 20 (*)  Glucose, Bld 101 (*)    Alkaline Phosphatase 37 (*)    All other components within normal limits  CBC    EKG None  Radiology No results found.  Procedures Procedures: not indicated.   Medications Ordered in ED Medications  LORazepam (ATIVAN) tablet 1 mg (1 mg Oral Given 08/07/22 0405)  ondansetron (ZOFRAN-ODT) disintegrating tablet 4 mg (4 mg Oral Given 08/07/22 0756)    ED Course/ Medical Decision Making/ A&P                             Medical Decision Making Amount and/or Complexity of Data Reviewed Labs: ordered.  Risk Prescription drug management.   Patient is a spanish-speaking female who presents to the ED today for anxiety. She reports that she has been feeling anxious for the past 6 days with symptoms of headache, palpitations, body aches, as well as new onset diarrhea as of yesterday.  My differentials include:  dehydration, electrolyte imbalance, atypical migraine, acute anxiety attack, etc.  On physical exam, patient is resting comfortably and in no acute distress.  Her vital signs are unremarkable with no signs of tachycardia or tachypnea.  She is normotensive and afebrile.  Patient went to [facility] yesterday and made an appointment for medication management.  This appointment is for July 30.  Patient is requesting medication in the interim until she can be seen by the behavioral health center.  I informed patient that I cannot give her 30 of Xanax.  I informed her that this is not a long-term medication that people can be on and that it supposed to be for acute anxiety attacks only.  Patient had her paperwork from [facility] with her at today's ED visit and showed me the information.  Per the paperwork, patient can walk into the behavioral health urgent care Monday through Friday starting at 7 AM without an appointment for care.  Since today is Saturday I told her I would provide her with only 2 tablets of Xanax Nancy she needs to go be seen at [facility] Monday when the clinic is open to get on medication as we do not manage long-term treatment in the ED setting.  All this information was relayed to patient via translator service.  Patient expressed understanding and said she would go first thing Monday morning to take care of getting medications for her anxiety.       Final Clinical Impression(s) / ED Diagnoses Final diagnoses:  Anxiety    Rx / DC Orders ED Discharge Orders          Ordered    ALPRAZolam (XANAX) 0.25 MG tablet  At bedtime PRN        08/07/22 1000              Maxwell Marion, PA-C 08/07/22 1648    Derwood Kaplan, MD 08/08/22 [phone]

## 2022-08-30 ENCOUNTER — Emergency Department (HOSPITAL_COMMUNITY): Payer: Self-pay

## 2022-08-30 ENCOUNTER — Emergency Department (HOSPITAL_COMMUNITY)
Admission: EM | Admit: 2022-08-30 | Discharge: 2022-08-30 | Disposition: A | Discharge status: Home / Self Care | Payer: Self-pay | Attending: Emergency Medicine | Admitting: Emergency Medicine

## 2022-08-30 ENCOUNTER — Other Ambulatory Visit: Payer: Self-pay

## 2022-08-30 DIAGNOSIS — R0789 Other chest pain: Secondary | ICD-10-CM | POA: Insufficient documentation

## 2022-08-30 DIAGNOSIS — R002 Palpitations: Secondary | ICD-10-CM | POA: Insufficient documentation

## 2022-08-30 DIAGNOSIS — F419 Anxiety disorder, unspecified: Secondary | ICD-10-CM | POA: Insufficient documentation

## 2022-08-30 LAB — BASIC METABOLIC PANEL
Anion gap: 8 (ref 5–15)
BUN: 12 mg/dL (ref 6–20)
CO2: 20 mmol/L — ABNORMAL LOW (ref 22–32)
Calcium: 9.1 mg/dL (ref 8.9–10.3)
Chloride: 108 mmol/L (ref 98–111)
Creatinine, Ser: 0.54 mg/dL (ref 0.44–1.00)
GFR, Estimated: >60 mL/min (ref >60)
Glucose, Bld: 113 mg/dL — ABNORMAL HIGH (ref 70–99)
Potassium: 3.5 mmol/L (ref 3.5–5.1)
Sodium: 136 mmol/L (ref 135–145)

## 2022-08-30 LAB — CBC
HCT: 41.1 % (ref 36.0–46.0)
Hemoglobin: 13.8 g/dL (ref 12.0–15.0)
MCH: 31.2 pg (ref 26.0–34.0)
MCHC: 33.6 g/dL (ref 30.0–36.0)
MCV: 92.8 fL (ref 80.0–100.0)
Platelets: 428 10*3/uL — ABNORMAL HIGH (ref 150–400)
RBC: 4.43 MIL/uL (ref 3.87–5.11)
RDW: 14.4 % (ref 11.5–15.5)
WBC: 9.6 10*3/uL (ref 4.0–10.5)
nRBC: 0 % (ref 0.0–0.2)

## 2022-08-30 LAB — TROPONIN I (HIGH SENSITIVITY): Troponin I (High Sensitivity): <2 ng/L (ref <18)

## 2022-08-30 LAB — HCG, SERUM, QUALITATIVE: Preg, Serum: NEGATIVE

## 2022-08-30 MED ORDER — KETOROLAC TROMETHAMINE 15 MG/ML IJ SOLN
15.0000 mg | Freq: Once | INTRAMUSCULAR | Status: AC
  Administered 2022-08-30: 15 mg via INTRAVENOUS
  Filled 2022-08-30: qty 1

## 2022-08-30 MED ORDER — LORAZEPAM 1 MG PO TABS
1.0000 mg | ORAL_TABLET | Freq: Once | ORAL | Status: AC
  Administered 2022-08-30: 1 mg via ORAL
  Filled 2022-08-30: qty 1

## 2022-08-30 NOTE — ED Notes (Signed)
Pt discharged home. Discharge information discussed. No s/s of distress observed during discharge. 

## 2022-08-30 NOTE — ED Provider Notes (Signed)
[place] EMERGENCY DEPARTMENT AT [facility] Provider Note   CSN: [id number] Arrival date & time: 08/30/22  9563     History  Chief Complaint  Patient presents with   Chest Pain    Nancy Hurst is a 32 y.o. female with past medical history significant for previous tuberculosis, now treated, previous perforated appendicitis, anxiety who presents with concern for palpitations with associated shortness of breath, left-sided chest pain since last night.  Patient reports that the palpitations happen intermittently, lasts around 10 seconds at a time, and are associated with sharp chest pain.  She cannot figure out what may be eliciting to palpitations, denies any increased life stress.  She denies any nausea, vomiting, burning in the esophagus.  She reports when not having palpitations she has some dull chest ache.  No previous history of ACS, hypertension, hyperlipidemia.  She has an appointment to follow-up with a cardiologist at the beginning of August after evaluation for similar last month.  No previous history of PE, no hemoptysis, no recent travel, thyroid function normal at last emergency department visit just 1 month ago.  Chest Pain      Home Medications Prior to Admission medications   Not on File      Allergies    Hydroxyzine    Review of Systems   Review of Systems  Cardiovascular:  Positive for chest pain.  All other systems reviewed and are negative.   Physical Exam Updated Vital Signs BP 101/77   Pulse 97   Temp 98.3 F (36.8 C) (Oral)   Resp 17   LMP 08/29/2022 (Exact Date)   SpO2 100%  Physical Exam Vitals and nursing note reviewed.  Constitutional:      General: She is not in acute distress.    Appearance: Normal appearance.  HENT:     Head: Normocephalic and atraumatic.  Eyes:     General:        Right eye: No discharge.        Left eye: No discharge.  Cardiovascular:     Rate and Rhythm: Normal rate and regular  rhythm.     Heart sounds: No murmur heard.    No friction rub. No gallop.  Pulmonary:     Effort: Pulmonary effort is normal.     Breath sounds: Normal breath sounds.  Chest:     Comments: Patient endorses mild tenderness to palpation of chest wall, no step-off, deformity, bruising noted on my exam Abdominal:     General: Bowel sounds are normal.     Palpations: Abdomen is soft.  Skin:    General: Skin is warm and dry.     Capillary Refill: Capillary refill takes less than 2 seconds.  Neurological:     Mental Status: She is alert and oriented to person, place, and time.  Psychiatric:        Mood and Affect: Mood normal.        Behavior: Behavior normal.     ED Results / Procedures / Treatments   Labs (all labs ordered are listed, but only abnormal results are displayed) Labs Reviewed  BASIC METABOLIC PANEL - Abnormal; Notable for the following components:      Result Value   CO2 20 (*)    Glucose, Bld 113 (*)    All other components within normal limits  CBC - Abnormal; Notable for the following components:   Platelets 428 (*)    All other components within normal limits  HCG, SERUM,  QUALITATIVE  TROPONIN I (HIGH SENSITIVITY)    EKG EKG Interpretation Date/Time:  Monday August 30 2022 06:27:31 EDT Ventricular Rate:  87 PR Interval:  139 QRS Duration:  82 QT Interval:  353 QTC Calculation: 425 R Axis:   94  Text Interpretation: Sinus rhythm Right atrial enlargement Borderline right axis deviation Low voltage, precordial leads Confirmed by Gloris Manchester [phone]) on 08/30/2022 7:46:19 AM  Radiology DG Chest 2 View  Result Date: 08/30/2022 CLINICAL DATA:  Left-sided chest pain since last night EXAM: CHEST - 2 VIEW COMPARISON:  08/02/2022 FINDINGS: Biapical reticulonodular opacity which is chronic. There is chronic lung disease by CT this patient with history of tuberculosis. There is no edema, consolidation, effusion, or pneumothorax. Normal heart size and mediastinal  contours. IMPRESSION: 1. No acute finding. 2. Bilateral scarring/nodularity at the apices correlating with history of TB. Electronically Signed   By: Tiburcio Pea M.D.   On: 08/30/2022 07:15    Procedures Procedures    Medications Ordered in ED Medications  LORazepam (ATIVAN) tablet 1 mg (has no administration in time range)  ketorolac (TORADOL) 15 MG/ML injection 15 mg (15 mg Intravenous Given 08/30/22 5643)    ED Course/ Medical Decision Making/ A&P                             Medical Decision Making  This patient is a 32 y.o. female  who presents to the ED for concern of chest pain, shob, palpitations  Differential diagnoses prior to evaluation: The emergent differential diagnosis includes, but is not limited to,  ACS, AAS, PE, Mallory-Weiss, Boerhaave's, Pneumonia, acute bronchitis, asthma or COPD exacerbation, anxiety, MSK pain or traumatic injury to the chest, acid reflux versus other, considered sick sinus syndrome, tachyarrhythmias, POTS, or other dysautonomia . This is not an exhaustive differential.   Past Medical History / Co-morbidities / Social History: Previously treated TB, anxiety, panic attacks, perforated appendicitis  Additional history: Chart reviewed. Pertinent results include: Extensively reviewed previous lab work, imaging, patient was seen and evaluated for similar presentation just around 1 month ago, with reassuring workup at that time.  She has a cardiology appointment scheduled in ~3 weeks  Physical Exam: Physical exam performed. The pertinent findings include: Patient is well-appearing with stable vital signs, no evidence of arrhythmia On heart auscultation or monitoring, she endorses some mild tenderness to palpation of the chest wall  Lab Tests/Imaging studies: I personally interpreted labs/imaging and the pertinent results include:  cbc, bmp, hcq qual, trop are all unremarkable. I independently interpreted imaging including plain film chest xray  which shows no acute intrathroacic abnormality. I agree with the radiologist interpretation.  Cardiac monitoring: EKG obtained and interpreted by myself and attending physician which shows: Normal sinus rhythm   Medications: I ordered medication including Toradol for pain, Ativan for anxiety.  I have reviewed the patients home medicines and have made adjustments as needed.   Disposition: After consideration of the diagnostic results and the patients response to treatment, I feel that patient with no evidence of acute cardiac cause for her for palpitations, her symptoms seem consistent with palpitations related to panic attacks, cannot rule out a arrhythmia not caught on ED evaluation today and I do think that further evaluation with cardiology is reasonable.   emergency department workup does not suggest an emergent condition requiring admission or immediate intervention beyond what has been performed at this time. The plan is: as above. The patient is safe  for discharge and has been instructed to return immediately for worsening symptoms, change in symptoms or any other concerns.  Final Clinical Impression(s) / ED Diagnoses Final diagnoses:  Anxiety  Atypical chest pain  Palpitations    Rx / DC Orders ED Discharge Orders     None         [place] 08/30/22 0825    Gloris Manchester, MD 08/30/22 [phone]

## 2022-08-30 NOTE — Discharge Instructions (Addendum)
Please follow up with the cardiologist as planned, and discuss with a primary care doctor regarding having a long term prescription for anxiety medication

## 2022-08-30 NOTE — ED Triage Notes (Signed)
Pt states that she began having palpitations with associated SOB and left sided chest pain lat night.

## 2022-08-31 DIAGNOSIS — G4709 Other insomnia: Secondary | ICD-10-CM | POA: Insufficient documentation

## 2022-08-31 DIAGNOSIS — F410 Panic disorder [episodic paroxysmal anxiety] without agoraphobia: Secondary | ICD-10-CM | POA: Insufficient documentation

## 2022-08-31 DIAGNOSIS — F32A Depression, unspecified: Secondary | ICD-10-CM | POA: Insufficient documentation

## 2022-08-31 DIAGNOSIS — R002 Palpitations: Secondary | ICD-10-CM | POA: Insufficient documentation

## 2022-08-31 DIAGNOSIS — Z79899 Other long term (current) drug therapy: Secondary | ICD-10-CM | POA: Insufficient documentation

## 2022-08-31 DIAGNOSIS — F411 Generalized anxiety disorder: Secondary | ICD-10-CM | POA: Insufficient documentation

## 2022-09-01 ENCOUNTER — Ambulatory Visit (HOSPITAL_COMMUNITY)
Admission: EM | Admit: 2022-09-01 | Discharge: 2022-09-01 | Disposition: A | Discharge status: Home / Self Care | Payer: No Payment, Other | Attending: Nurse Practitioner | Admitting: Nurse Practitioner

## 2022-09-01 DIAGNOSIS — G4709 Other insomnia: Secondary | ICD-10-CM

## 2022-09-01 DIAGNOSIS — F410 Panic disorder [episodic paroxysmal anxiety] without agoraphobia: Secondary | ICD-10-CM | POA: Diagnosis not present

## 2022-09-01 DIAGNOSIS — F411 Generalized anxiety disorder: Secondary | ICD-10-CM

## 2022-09-01 LAB — POC URINE PREG, ED

## 2022-09-01 LAB — POCT URINE DRUG SCREEN - MANUAL ENTRY (I-SCREEN)
POC Amphetamine UR: NOT DETECTED
POC Buprenorphine (BUP): NOT DETECTED
POC Cocaine UR: NOT DETECTED
POC Marijuana UR: NOT DETECTED
POC Methadone UR: NOT DETECTED
POC Methamphetamine UR: NOT DETECTED
POC Morphine: NOT DETECTED
POC Oxazepam (BZO): POSITIVE — AB
POC Oxycodone UR: NOT DETECTED
POC Secobarbital (BAR): NOT DETECTED

## 2022-09-01 LAB — POCT PREGNANCY, URINE: Preg Test, Ur: NEGATIVE

## 2022-09-01 MED ORDER — MAGNESIUM HYDROXIDE 400 MG/5ML PO SUSP: 30.0000 mL | Freq: Every day | ORAL | Status: DC | PRN

## 2022-09-01 MED ORDER — SUMATRIPTAN SUCCINATE 25 MG PO TABS
25.0000 mg | ORAL_TABLET | Freq: Once | ORAL | Status: DC
Start: 2022-09-01 — End: 2022-09-01

## 2022-09-01 MED ORDER — ESCITALOPRAM OXALATE 5 MG PO TABS
5.0000 mg | ORAL_TABLET | Freq: Every day | ORAL | Status: DC
  Administered 2022-09-01: 5 mg via ORAL
  Filled 2022-09-01 (×2): qty 1

## 2022-09-01 MED ORDER — LORAZEPAM 1 MG PO TABS
1.0000 mg | ORAL_TABLET | Freq: Once | ORAL | Status: AC
  Administered 2022-09-01: 1 mg via ORAL
  Filled 2022-09-01: qty 1

## 2022-09-01 MED ORDER — TRAZODONE HCL 50 MG PO TABS: 50.0000 mg | ORAL_TABLET | Freq: Every evening | ORAL | Status: DC | PRN

## 2022-09-01 MED ORDER — SUMATRIPTAN SUCCINATE 25 MG PO TABS: 25.0000 mg | ORAL_TABLET | ORAL | Status: DC | PRN

## 2022-09-01 MED ORDER — ACETAMINOPHEN 325 MG PO TABS: 650.0000 mg | ORAL_TABLET | Freq: Four times a day (QID) | ORAL | Status: DC | PRN

## 2022-09-01 MED ORDER — ALUM & MAG HYDROXIDE-SIMETH 200-200-20 MG/5ML PO SUSP: 30.0000 mL | ORAL | Status: DC | PRN

## 2022-09-01 MED ORDER — ESCITALOPRAM OXALATE 5 MG PO TABS: 5.0000 mg | ORAL_TABLET | Freq: Every day | ORAL | 0 refills | Status: DC

## 2022-09-01 NOTE — ED Notes (Signed)
Patient resting quietly in bed with eyes closed. Respirations equal and unlabored, skin warm and dry, NAD. Routine safety checks conducted according to facility protocol. Will continue to monitor for safety.  

## 2022-09-01 NOTE — Progress Notes (Signed)
   09/01/22 0008  [facility] Triage Screening (Walk-ins at [facility] only)  How Did You Hear About [place]? Family/Friend  What Is the Reason for Your Visit/Call Today? Pt presents to [facility] voluntarily, accompanied by her significant other with complaint of worsening anxiety, headaches and requesting medication. Pt reports experiencing bad headaches with throbbing like sensations and lack of sleep. Pt reports having follow up appointment with [facility] on July 30, but she is unable to wait until then. Pt was seen at [facility] for heart palpitations and shortness of breath a few days ago. Pt currently denies SI,HI,AVH and substance/alcohol use.  How Long Has This Been Causing You Problems? 1 wk - 1 month  Have You Recently Had Any Thoughts About Hurting Yourself? No  Are You Planning to Commit Suicide/Harm Yourself At This time? No  Have you Recently Had Thoughts About Hurting Someone Nancy Hurst? No  Are You Planning To Harm Someone At This Time? No  Are you currently experiencing any auditory, visual or other hallucinations? No  Do you have any current medical co-morbidities that require immediate attention? No  Clinician description of patient physical appearance/behavior: Pt is calm, cooperative  What Do You Feel Would Help You the Most Today? Treatment for Depression or other mood problem  If access to [facility] was not available, would you have sought care in the Emergency Department? No  Determination of Need Routine (7 days)  Options For Referral Other: Comment;Outpatient Therapy;Medication Management

## 2022-09-01 NOTE — Discharge Instructions (Addendum)

## 2022-09-01 NOTE — ED Provider Notes (Signed)
[facility] Continuous Assessment Admission H&P  Date: 09/01/22 Patient Name: Nancy Hurst MRN: [id number] Chief Complaint: "I have strong headache, panic, anxiety".  Diagnoses:  Final diagnoses:  Panic attacks  GAD (generalized anxiety disorder)  Other insomnia    HPI: Nancy Hurst is a 32 year old female with psychiatric history of anxiety and panic attacks. Who presented voluntarily as a walk in to [facility] accompanied by her husband Eloisa Northern [phone] with complaints of worsening anxiety.  Patient was seen face to face by this provider and chart reviewed. Per chart review, patient was seen at [facility] 08/30/22 for palpitations with associated SOB and left sided sharp chest pain.   Patient gave permission for her husband to remain in the room during this evaluation. Professional spanish language interpreter services was utilized for this assessment with agent/Miguel [phone].  On evaluation, patient is alert, oriented x 4, and cooperative. Speech is clear, normal rate and coherent. Pt appears well groomed. Eye contact is fair. Mood is anxious, and depressed, affect is flat, tearful and congruent with mood. Thought process is coherent and thought content is WDL. Pt denies SI/HI/AVH. There is no objective indication that the patient is responding to internal stimuli. No delusions elicited during this assessment.    Patient reports "I have a strong headache going down my neck, panic attacks and anxiety, the headaches started a couple of weeks ago and is really strong today".  Patient reports she sometimes experiences nausea and vomiting with the headaches which is diffuse all over her head.   Patient reports she takes Tylenol before the headache starts but it is mostly ineffective, but she was waiting for her upcoming [facility] appointment 09/07/22 to inform the doctor, but she is unable to wait until then as the headache is really strong today.  Patient reports her  anxiety and panic attacks are worsened by the headache and is unsure if she developed anxiety/panic symptoms because of her headache which she consistently describes as really strong.  Patient identifies her stressors as her work and reports her sleep and appetite as poor as she only gets 5 to 6 hours of sleep at nighttime.  Patient reports her anxiety and panic attacks manifest as chest palpitations, shortness of breath, dizziness, restlessness, and feeling of desperation which has been ongoing for about a month.  Patient denies illicit substance use.  She lives with her husband.  She denies the presence of access to a weapon or gun.  Patient reports being to the hospital this week and last month for her symptoms and was given Atarax, which made her symptoms worse.  Patient denies history of inpatient psychiatric hospitalizations and reports she is currently not taking any medications and is not established with outpatient psychiatric services.  Patient has an upcoming psychiatric appointment for medication management at [facility] walk in clinic 09/07/22 at 8 am.   Support, encouragement, reassurance provided about ongoing stressors.  Patient and her husband are provided with opportunity for questions.  Discussed recommendation for admission to continuous obs unit for stabilization/treatment and re-eval in the am.  Briefly discussed medication options for management of anxiety/panic disorder, and migraines, including benefits and risks of treatment. Patient and her husband verbalized their understanding and are in agreement.   Total Time spent with patient: 30 minutes  Musculoskeletal  Strength & Muscle Tone: within normal limits Gait & Station: normal Patient leans: N/A  Psychiatric Specialty Exam  Presentation General Appearance:  Appropriate for Environment  Eye Contact: Fair  Speech: Clear and Coherent  Speech Volume: Normal  Handedness: Right   Mood and Affect   Mood: Anxious; Depressed  Affect: Congruent; Tearful; Depressed   Thought Process  Thought Processes: Coherent  Descriptions of Associations:Intact  Orientation:Full (Time, Place and Person)  Thought Content:WDL    Hallucinations:Hallucinations: None  Ideas of Reference:None  Suicidal Thoughts:Suicidal Thoughts: No  Homicidal Thoughts:Homicidal Thoughts: No   Sensorium  Memory: Immediate Good  Judgment: Intact  Insight: Present   Executive Functions  Concentration: Fair  Attention Span: Good  Recall: Good  Fund of Knowledge: Good  Language: Good   Psychomotor Activity  Psychomotor Activity: Psychomotor Activity: Restlessness   Assets  Assets: Communication Skills; Desire for Improvement; Social Support   Sleep  Sleep: Sleep: Poor   Nutritional Assessment (For OBS and FBC admissions only) Has the patient had a weight loss or gain of 10 pounds or more in the last 3 months?: No Has the patient had a decrease in food intake/or appetite?: No Does the patient have dental problems?: No Does the patient have eating habits or behaviors that may be indicators of an eating disorder including binging or inducing vomiting?: No Has the patient recently lost weight without trying?: 0 Has the patient been eating poorly because of a decreased appetite?: 0 Malnutrition Screening Tool Score: 0    Physical Exam Constitutional:      General: She is not in acute distress.    Appearance: She is not diaphoretic.  HENT:     Head: Normocephalic.     Nose: No congestion.  Eyes:     General:        Right eye: No discharge.        Left eye: No discharge.  Cardiovascular:     Rate and Rhythm: Normal rate.  Pulmonary:     Effort: No respiratory distress.  Chest:     Chest wall: No tenderness.  Neurological:     Mental Status: She is alert and oriented to person, place, and time.  Psychiatric:        Attention and Perception: Attention and  perception normal.        Mood and Affect: Mood is anxious and depressed. Affect is flat and tearful.        Speech: Speech normal.        Behavior: Behavior is cooperative.        Thought Content: Thought content normal. Thought content is not paranoid or delusional. Thought content does not include homicidal or suicidal ideation. Thought content does not include homicidal or suicidal plan.        Cognition and Memory: Cognition and memory normal.    Review of Systems  Constitutional:  Negative for chills, diaphoresis and fever.  HENT:  Negative for congestion.   Eyes:  Negative for discharge.  Respiratory:  Negative for cough, shortness of breath and wheezing.   Cardiovascular:  Negative for chest pain and palpitations.  Gastrointestinal:  Negative for diarrhea, nausea and vomiting.  Neurological:  Positive for headaches.  Psychiatric/Behavioral:  Positive for depression. The patient is nervous/anxious and has insomnia.     Blood pressure 116/79, pulse 76, temperature 98.4 F (36.9 C), temperature source Oral, resp. rate 18, last menstrual period 08/29/2022, SpO2 100%. There is no height or weight on file to calculate BMI.  Past Psychiatric History: See H & P   Is the patient at risk to self? No  Has the patient been a risk to self in the past 6 months?  No .    Has the patient been a risk to self within the distant past? No   Is the patient a risk to others? No   Has the patient been a risk to others in the past 6 months? No   Has the patient been a risk to others within the distant past? No   Past Medical History: See Chart  Family History: N/A  Social History: N/A  Last Labs:  Admission on 08/30/2022, Discharged on 08/30/2022  Component Date Value Ref Range Status   Sodium 08/30/2022 136  135 - 145 mmol/L Final   Potassium 08/30/2022 3.5  3.5 - 5.1 mmol/L Final   Chloride 08/30/2022 108  98 - 111 mmol/L Final   CO2 08/30/2022 20 (L)  22 - 32 mmol/L Final   Glucose, Bld  08/30/2022 113 (H)  70 - 99 mg/dL Final   Glucose reference range applies only to samples taken after fasting for at least 8 hours.   BUN 08/30/2022 12  6 - 20 mg/dL Final   Creatinine, Ser 08/30/2022 0.54  0.44 - 1.00 mg/dL Final   Calcium 32/67/1245 9.1  8.9 - 10.3 mg/dL Final   GFR, Estimated 08/30/2022 >60  >60 mL/min Final   Comment: (NOTE) Calculated using the CKD-EPI Creatinine Equation (2021)    Anion gap 08/30/2022 8  5 - 15 Final   Performed at [facility], [address] [place], [place] [address]   WBC 08/30/2022 9.6  4.0 - 10.5 K/uL Final   RBC 08/30/2022 4.43  3.87 - 5.11 MIL/uL Final   Hemoglobin 08/30/2022 13.8  12.0 - 15.0 g/dL Final   HCT 33/82/5053 41.1  36.0 - 46.0 % Final   MCV 08/30/2022 92.8  80.0 - 100.0 fL Final   MCH 08/30/2022 31.2  26.0 - 34.0 pg Final   MCHC 08/30/2022 33.6  30.0 - 36.0 g/dL Final   RDW 97/67/3419 14.4  11.5 - 15.5 % Final   Platelets 08/30/2022 428 (H)  150 - 400 K/uL Final   nRBC 08/30/2022 0.0  0.0 - 0.2 % Final   Performed at [facility], [address] [place], [place] [address]   Troponin I (High Sensitivity) 08/30/2022 <2  <18 ng/L Final   Comment: (NOTE) Elevated high sensitivity troponin I (hsTnI) values and significant  changes across serial measurements may suggest ACS but many other  chronic and acute conditions are known to elevate hsTnI results.  Refer to the "Links" section for chest pain algorithms and additional  guidance. Performed at [facility], [address], [place], [place] [address]    Preg, Serum 08/30/2022 NEGATIVE  NEGATIVE Final   Comment:        THE SENSITIVITY OF THIS METHODOLOGY IS >10 mIU/mL. Performed at [facility], [address] [place], [place] [address]   Admission on 08/07/2022, Discharged on 08/07/2022  Component Date Value Ref Range Status   Sodium 08/07/2022 135  135 - 145 mmol/L Final   Potassium  08/07/2022 4.1  3.5 - 5.1 mmol/L Final   Chloride 08/07/2022 105  98 - 111 mmol/L Final   CO2 08/07/2022 20 (L)  22 - 32 mmol/L Final   Glucose, Bld 08/07/2022 101 (H)  70 - 99 mg/dL Final   Glucose reference range applies only to samples taken after fasting for at least 8 hours.   BUN 08/07/2022 9  6 - 20 mg/dL Final   Creatinine, Ser 08/07/2022 0.52  0.44 -  1.00 mg/dL Final   Calcium 16/11/9602 9.2  8.9 - 10.3 mg/dL Final   Total Protein 54/10/8117 8.1  6.5 - 8.1 g/dL Final   Albumin 14/78/2956 4.4  3.5 - 5.0 g/dL Final   AST 21/30/8657 16  15 - 41 U/L Final   ALT 08/07/2022 12  0 - 44 U/L Final   Alkaline Phosphatase 08/07/2022 37 (L)  38 - 126 U/L Final   Total Bilirubin 08/07/2022 1.0  0.3 - 1.2 mg/dL Final   GFR, Estimated 08/07/2022 >60  >60 mL/min Final   Comment: (NOTE) Calculated using the CKD-EPI Creatinine Equation (2021)    Anion gap 08/07/2022 10  5 - 15 Final   Performed at [facility], [address] [place], [place] [address]   WBC 08/07/2022 7.4  4.0 - 10.5 K/uL Final   RBC 08/07/2022 4.23  3.87 - 5.11 MIL/uL Final   Hemoglobin 08/07/2022 13.1  12.0 - 15.0 g/dL Final   HCT 29/52/8413 38.6  36.0 - 46.0 % Final   MCV 08/07/2022 91.3  80.0 - 100.0 fL Final   MCH 08/07/2022 31.0  26.0 - 34.0 pg Final   MCHC 08/07/2022 33.9  30.0 - 36.0 g/dL Final   RDW 24/40/1027 13.5  11.5 - 15.5 % Final   Platelets 08/07/2022 377  150 - 400 K/uL Final   nRBC 08/07/2022 0.0  0.0 - 0.2 % Final   Performed at [facility], [address] [place], [place] [address]  Admission on 08/04/2022, Discharged on 08/05/2022  Component Date Value Ref Range Status   Sodium 08/04/2022 139  135 - 145 mmol/L Final   Potassium 08/04/2022 3.3 (L)  3.5 - 5.1 mmol/L Final   Chloride 08/04/2022 106  98 - 111 mmol/L Final   CO2 08/04/2022 22  22 - 32 mmol/L Final   Glucose, Bld 08/04/2022 115 (H)  70 - 99 mg/dL Final   Glucose reference range applies only to  samples taken after fasting for at least 8 hours.   BUN 08/04/2022 14  6 - 20 mg/dL Final   Creatinine, Ser 08/04/2022 0.59  0.44 - 1.00 mg/dL Final   Calcium 44/04/4740 9.2  8.9 - 10.3 mg/dL Final   Total Protein 59/56/3875 8.7 (H)  6.5 - 8.1 g/dL Final   Albumin 64/33/2951 4.7  3.5 - 5.0 g/dL Final   AST 88/41/6606 17  15 - 41 U/L Final   ALT 08/04/2022 15  0 - 44 U/L Final   Alkaline Phosphatase 08/04/2022 41  38 - 126 U/L Final   Total Bilirubin 08/04/2022 0.8  0.3 - 1.2 mg/dL Final   GFR, Estimated 08/04/2022 >60  >60 mL/min Final   Comment: (NOTE) Calculated using the CKD-EPI Creatinine Equation (2021)    Anion gap 08/04/2022 11  5 - 15 Final   Performed at [facility], [address], [place], [place] [address]   Alcohol, Ethyl (B) 08/04/2022 <10  <10 mg/dL Final   Comment: (NOTE) Lowest detectable limit for serum alcohol is 10 mg/dL.  For medical purposes only. Performed at [facility], [address], [place], [place] [address]    Opiates 08/04/2022 NONE DETECTED  NONE DETECTED Final   Cocaine 08/04/2022 NONE DETECTED  NONE DETECTED Final   Benzodiazepines 08/04/2022 NONE DETECTED  NONE DETECTED Final   Amphetamines 08/04/2022 NONE DETECTED  NONE DETECTED Final   Tetrahydrocannabinol 08/04/2022 NONE DETECTED  NONE DETECTED Final   Barbiturates 08/04/2022 NONE DETECTED  NONE  DETECTED Final   Comment: (NOTE) DRUG SCREEN FOR MEDICAL PURPOSES ONLY.  IF CONFIRMATION IS NEEDED FOR ANY PURPOSE, NOTIFY LAB WITHIN 5 DAYS.  LOWEST DETECTABLE LIMITS FOR URINE DRUG SCREEN Drug Class                     Cutoff (ng/mL) Amphetamine and metabolites    1000 Barbiturate and metabolites    200 Benzodiazepine                 200 Opiates and metabolites        300 Cocaine and metabolites        300 THC                            50 Performed at [facility] [address], [place], [place] [address]    WBC 08/04/2022 6.1   4.0 - 10.5 K/uL Final   RBC 08/04/2022 4.17  3.87 - 5.11 MIL/uL Final   Hemoglobin 08/04/2022 13.1  12.0 - 15.0 g/dL Final   HCT 60/11/9321 39.1  36.0 - 46.0 % Final   MCV 08/04/2022 93.8  80.0 - 100.0 fL Final   MCH 08/04/2022 31.4  26.0 - 34.0 pg Final   MCHC 08/04/2022 33.5  30.0 - 36.0 g/dL Final   RDW 55/73/2202 14.4  11.5 - 15.5 % Final   Platelets 08/04/2022 369  150 - 400 K/uL Final   nRBC 08/04/2022 0.0  0.0 - 0.2 % Final   Neutrophils Relative % 08/04/2022 60  % Final   Neutro Abs 08/04/2022 3.7  1.7 - 7.7 K/uL Final   Lymphocytes Relative 08/04/2022 33  % Final   Lymphs Abs 08/04/2022 2.0  0.7 - 4.0 K/uL Final   Monocytes Relative 08/04/2022 7  % Final   Monocytes Absolute 08/04/2022 0.4  0.1 - 1.0 K/uL Final   Eosinophils Relative 08/04/2022 0  % Final   Eosinophils Absolute 08/04/2022 0.0  0.0 - 0.5 K/uL Final   Basophils Relative 08/04/2022 0  % Final   Basophils Absolute 08/04/2022 0.0  0.0 - 0.1 K/uL Final   Immature Granulocytes 08/04/2022 0  % Final   Abs Immature Granulocytes 08/04/2022 0.01  0.00 - 0.07 K/uL Final   Performed at [facility], [address] [place], [place] [address]   Preg, Serum 08/04/2022 NEGATIVE  NEGATIVE Final   Comment:        THE SENSITIVITY OF THIS METHODOLOGY IS >10 mIU/mL. Performed at [facility], [address], [place], [place] [address]   Admission on 08/02/2022, Discharged on 08/03/2022  Component Date Value Ref Range Status   Sodium 08/02/2022 137  135 - 145 mmol/L Final   Potassium 08/02/2022 3.5  3.5 - 5.1 mmol/L Final   Chloride 08/02/2022 103  98 - 111 mmol/L Final   CO2 08/02/2022 18 (L)  22 - 32 mmol/L Final   Glucose, Bld 08/02/2022 158 (H)  70 - 99 mg/dL Final   Glucose reference range applies only to samples taken after fasting for at least 8 hours.   BUN 08/02/2022 10  6 - 20 mg/dL Final   Creatinine, Ser 08/02/2022 0.60  0.44 - 1.00 mg/dL Final   Calcium 28/31/5176 9.3  8.9  - 10.3 mg/dL Final   GFR, Estimated 08/02/2022 >60  >60 mL/min Final   Comment: (NOTE) Calculated using the CKD-EPI Creatinine Equation (2021)    Anion gap 08/02/2022  16 (H)  5 - 15 Final   Performed at [facility], [address] [place], [place] [address]   WBC 08/02/2022 6.8  4.0 - 10.5 K/uL Final   RBC 08/02/2022 4.31  3.87 - 5.11 MIL/uL Final   Hemoglobin 08/02/2022 13.8  12.0 - 15.0 g/dL Final   HCT 60/45/4098 40.0  36.0 - 46.0 % Final   MCV 08/02/2022 92.8  80.0 - 100.0 fL Final   MCH 08/02/2022 32.0  26.0 - 34.0 pg Final   MCHC 08/02/2022 34.5  30.0 - 36.0 g/dL Final   RDW 11/91/4782 14.3  11.5 - 15.5 % Final   Platelets 08/02/2022 359  150 - 400 K/uL Final   nRBC 08/02/2022 0.0  0.0 - 0.2 % Final   Performed at [facility], [address] [place], [place] [address]   Troponin I (High Sensitivity) 08/02/2022 <2  <18 ng/L Final   Comment: (NOTE) Elevated high sensitivity troponin I (hsTnI) values and significant  changes across serial measurements may suggest ACS but many other  chronic and acute conditions are known to elevate hsTnI results.  Refer to the "Links" section for chest pain algorithms and additional  guidance. Performed at [facility], [address] [place], [place] [address]    I-stat hCG, quantitative 08/02/2022 <5.0  <5 mIU/mL Final   Comment 3 08/02/2022          Final   Comment:   GEST. AGE      CONC.  (mIU/mL)   <=1 WEEK        5 - 50     2 WEEKS       50 - 500     3 WEEKS       100 - 10,000     4 WEEKS     1,000 - 30,000        FEMALE AND NON-PREGNANT FEMALE:     LESS THAN 5 mIU/mL    TSH 08/02/2022 0.489  0.350 - 4.500 uIU/mL Final   Comment: Performed by a 3rd Generation assay with a functional sensitivity of <=0.01 uIU/mL. Performed at [facility], [address], [place], [place] [address]    Troponin I (High Sensitivity) 08/02/2022 <2  <18 ng/L Final   Comment: (NOTE) Elevated high sensitivity troponin I  (hsTnI) values and significant  changes across serial measurements may suggest ACS but many other  chronic and acute conditions are known to elevate hsTnI results.  Refer to the "Links" section for chest pain algorithms and additional  guidance. Performed at [facility], [address] [place], [place] [address]     Allergies: Hydroxyzine  Medications:  Facility Ordered Medications  Medication   acetaminophen (TYLENOL) tablet 650 mg   alum & mag hydroxide-simeth (MAALOX/MYLANTA) 200-200-20 MG/5ML suspension 30 mL   magnesium hydroxide (MILK OF MAGNESIA) suspension 30 mL   LORazepam (ATIVAN) tablet 1 mg   SUMAtriptan (IMITREX) tablet 25 mg   escitalopram (LEXAPRO) tablet 5 mg   traZODone (DESYREL) tablet 50 mg   Prior to Admission medications   Not on File      Medical Decision Making  Recommend admission to the continuous observation unit overnight for treatment/stabilization and re-eval in the a.m..  Lab Orders         POC urine preg, ED         POCT Urine Drug Screen - (I-Screen)      Reviewed labs results obtained  at [facility] 08/30/22: Per EDP, cbc, bmp, hcq qual, trop are all unremarkable. chest xray which shows no acute intrathroacic abnormality.  patient was seen and evaluated for similar presentation just around 1 month ago, with reassuring workup at that time. She has a cardiology appointment scheduled in ~3 weeks    Medications started this encounter -Lexapro 5 mg p.o. daily for GAD, panic disorder -Ativan 1 mg p.o. x 1 dose for increased anxiety -Imitrex 25 mg p.o. every 2 hours as needed migraine, headache -Trazodone 50 mg p.o. nightly as needed insomnia  Other PRNs -Tylenol 650 mg p.o. every 6 hours as needed pain -Maalox 30 ml p.o. every 4 hours as needed indigestion -MOM 30 ml p.o. daily as needed constipation   Recommendations  Based on my evaluation the patient does not appear to have an emergency medical condition.  Recommend admission to the  continuous observation unit overnight for treatment/stabilization and re-eval in the a.m.Marland Kitchen  Mancel Bale, NP 09/01/22  2:28 AM

## 2022-09-01 NOTE — ED Notes (Signed)
Patient A&Ox4. Patient denies SI/HI and AVH. Patient denies any physical complaints when asked. No acute distress noted. Support and encouragement provided. Routine safety checks conducted according to facility protocol. Encouraged patient to notify staff if thoughts of harm toward self or others arise. Patient verbalize understanding and agreement. Will continue to monitor for safety.    

## 2022-09-01 NOTE — ED Provider Notes (Signed)
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  She reports increasing anxiety x 1 month.  Patient is reassessed by this nurse practitioner face-to-face.  Spanish interpreter, Leary, interpreter [phone] assisted with this interview.  Patient is alert and oriented, pleasant and cooperative during assessment.  She presents with euthymic mood, congruent affect.  Chart reviewed and patient discussed with Dr. Nelly Rout on 09/01/2022.  Nancy Hurst reports feeling overwhelmed by her work.  She describes her boss who is demanding.  Nancy Hurst discharged from [facility] behavioral health on 08/06/2022 after brief stay.  She is linked with outpatient psychiatry with an appointment next Tuesday for medication management.  She will be seen by individual counseling in 3 weeks time.  No family mental health or addiction history reported.  No previous admission aside from admission 1 month ago.  Patient denies suicidal and homicidal ideations.  She denies history of suicide attempts, denies history of nonsuicidal self-harm behavior.  She easily contracts verbally for safety at this time.  Patient resides in [place] with her husband.  She denies access to weapons.  She endorses average sleep and appetite.  She denies alcohol and substance use.  Patient offered support and encouragement.  Inpatient psychiatric hospitalization offered, patient declines.  She reports readiness to discharge home.  Nancy Hurst gives verbal consent to speak with her husband, [name],  phone number [phone].  Spoke with patient's husband who denies  safety concerns.  He verbalizes understanding of treatment plan.  He verbalizes understanding of strict return precautions.   Patient and family are educated and verbalize understanding of mental health resources and other crisis services in the community. They are instructed to call 911 and present to the nearest emergency room should patient experience any suicidal/homicidal ideation, auditory/visual/hallucinations, or detrimental worsening of mental health condition.     Stay Summary: HPI 09/01/2022- 0201am: Nancy Hurst, Nancy Hurst is a 32 year old female with psychiatric history of anxiety and panic attacks. Who presented voluntarily as a walk in to [facility] accompanied by her husband Eloisa Northern [phone] with complaints of worsening anxiety.   Patient was seen face to face by this provider and chart reviewed. Per chart review, patient was seen at [facility] 08/30/22 for palpitations with associated SOB and left sided sharp chest pain.    Patient gave permission for her husband to remain in the room during this evaluation. Professional spanish language interpreter services was utilized for this assessment with agent/Miguel [phone].   On evaluation, patient is alert, oriented x 4, and cooperative. Speech is clear, normal rate and coherent. Pt appears well groomed. Eye contact is fair. Mood is anxious, and depressed, affect is flat, tearful and congruent with mood. Thought process is coherent and thought content is WDL. Pt denies SI/HI/AVH. There is no objective indication that the patient is responding to internal stimuli. No delusions elicited during this assessment.     Patient reports "I have a strong headache going down my neck, panic attacks and anxiety, the headaches started a couple of weeks ago and is really strong today".  Patient reports she sometimes experiences nausea and vomiting  with the headaches which is diffuse all over her head.   Patient reports she takes Tylenol before the headache  starts but it is mostly ineffective, but she was waiting for her upcoming [facility] appointment 09/07/22 to inform the doctor, but she is unable to wait until then as the headache is really strong today.  Patient reports her anxiety and panic attacks are worsened by the headache and is unsure if she developed anxiety/panic symptoms because of her headache which she consistently describes as really strong.   Patient identifies her stressors as her work and reports her sleep and appetite as poor as she only gets 5 to 6 hours of sleep at nighttime.   Patient reports her anxiety and panic attacks manifest as chest palpitations, shortness of breath, dizziness, restlessness, and feeling of desperation which has been ongoing for about a month.   Patient denies illicit substance use.  She lives with her husband.  She denies the presence of access to a weapon or gun.   Patient reports being to the hospital this week and last month for her symptoms and was given Atarax, which made her symptoms worse.   Patient denies history of inpatient psychiatric hospitalizations and reports she is currently not taking any medications and is not established with outpatient psychiatric services.   Patient has an upcoming psychiatric appointment for medication management at [facility] walk in clinic 09/07/22 at 8 am.    Support, encouragement, reassurance provided about ongoing stressors.  Patient and her husband are provided with opportunity for questions.   Discussed recommendation for admission to continuous obs unit for stabilization/treatment and re-eval in the am.  Briefly discussed medication options for management of anxiety/panic disorder, and migraines, including benefits and risks of treatment. Patient and her husband verbalized their understanding and are in agreement.   Total Time spent with patient: 1 hour  Past Psychiatric History: anxiety, panic attack Past Medical History: Recurrent UTI, pleural effusion,  abdominal ascites, normocytic anemia, moderate protein calorie malnutrition, disseminated tuberculosis, thoracentesis, ovarian mass Family History: None reported Family Psychiatric History: Denies Social History: Married, resides with family, employed, denies substance use Tobacco Cessation:  N/A, patient does not currently use tobacco products  Current Medications:  Current Facility-Administered Medications  Medication Dose Route Frequency Provider Last Rate Last Admin   acetaminophen (TYLENOL) tablet 650 mg  650 mg Oral Q6H PRN Onuoha, Chinwendu V, NP       alum & mag hydroxide-simeth (MAALOX/MYLANTA) 200-200-20 MG/5ML suspension 30 mL  30 mL Oral Q4H PRN Onuoha, Chinwendu V, NP       escitalopram (LEXAPRO) tablet 5 mg  5 mg Oral Daily Onuoha, Chinwendu V, NP   5 mg at 09/01/22 7829   magnesium hydroxide (MILK OF MAGNESIA) suspension 30 mL  30 mL Oral Daily PRN Onuoha, Chinwendu V, NP       SUMAtriptan (IMITREX) tablet 25 mg  25 mg Oral Q2H PRN Onuoha, Chinwendu V, NP       traZODone (DESYREL) tablet 50 mg  50 mg Oral QHS PRN Onuoha, Chinwendu V, NP       Current Outpatient Medications  Medication Sig Dispense Refill   [START ON 09/02/2022] escitalopram (LEXAPRO) 5 MG tablet Take 1 tablet (5 mg total) by mouth daily. 30 tablet 0    PTA Medications:  PTA Medications  Medication Sig   [START ON 09/02/2022] escitalopram (LEXAPRO) 5 MG tablet Take 1 tablet (5 mg total) by mouth daily.   Facility Ordered Medications  Medication  acetaminophen (TYLENOL) tablet 650 mg   alum & mag hydroxide-simeth (MAALOX/MYLANTA) 200-200-20 MG/5ML suspension 30 mL   magnesium hydroxide (MILK OF MAGNESIA) suspension 30 mL   [COMPLETED] LORazepam (ATIVAN) tablet 1 mg   SUMAtriptan (IMITREX) tablet 25 mg   escitalopram (LEXAPRO) tablet 5 mg   traZODone (DESYREL) tablet 50 mg        No data to display          Flowsheet Row ED from 09/01/2022 in [facility] ED from  08/30/2022 in [facility] Emergency Department at [facility] ED from 08/07/2022 in [facility] Emergency Department at [facility]  C-SSRS RISK CATEGORY No Risk No Risk No Risk       Musculoskeletal  Strength & Muscle Tone: within normal limits Gait & Station: normal Patient leans: N/A  Psychiatric Specialty Exam  Presentation  General Appearance:  Appropriate for Environment; Casual  Eye Contact: Good  Speech: Clear and Coherent; Normal Rate  Speech Volume: Normal  Handedness: Right   Mood and Affect  Mood: Euthymic  Affect: Appropriate; Congruent   Thought Process  Thought Processes: Coherent; Goal Directed; Linear  Descriptions of Associations:Intact  Orientation:Full (Time, Place and Person)  Thought Content:Logical; WDL     Hallucinations:Hallucinations: None  Ideas of Reference:None  Suicidal Thoughts:Suicidal Thoughts: No  Homicidal Thoughts:Homicidal Thoughts: No   Sensorium  Memory: Immediate Good; Recent Good  Judgment: Fair  Insight: Fair   Art therapist  Concentration: Good  Attention Span: Good  Recall: Good  Fund of Knowledge: Good  Language: Good   Psychomotor Activity  Psychomotor Activity: Psychomotor Activity: Normal   Assets  Assets: Communication Skills; Desire for Improvement; Housing; Physical Health; Resilience; Social Support   Sleep  Sleep: Sleep: Fair   Nutritional Assessment (For OBS and FBC admissions only) Has the patient had a weight loss or gain of 10 pounds or more in the last 3 months?: No Has the patient had a decrease in food intake/or appetite?: No Does the patient have dental problems?: No Does the patient have eating habits or behaviors that may be indicators of an eating disorder including binging or inducing vomiting?: No Has the patient recently lost weight without trying?: 0 Has the patient been eating poorly because of a decreased appetite?:  0 Malnutrition Screening Tool Score: 0    Physical Exam  Physical Exam Vitals and nursing note reviewed.  Constitutional:      Appearance: Normal appearance. She is well-developed.  HENT:     Head: Normocephalic and atraumatic.     Nose: Nose normal.  Cardiovascular:     Rate and Rhythm: Normal rate.  Pulmonary:     Effort: Pulmonary effort is normal.  Musculoskeletal:        General: Normal range of motion.     Cervical back: Normal range of motion.  Skin:    General: Skin is warm and dry.  Neurological:     Mental Status: She is alert and oriented to person, place, and time.  Psychiatric:        Attention and Perception: Attention and perception normal.        Mood and Affect: Mood and affect normal.        Speech: Speech normal.        Behavior: Behavior normal. Behavior is cooperative.        Thought Content: Thought content normal.        Cognition and Memory: Cognition and memory normal.  Review of Systems  Constitutional: Negative.   HENT: Negative.    Eyes: Negative.   Respiratory: Negative.    Cardiovascular: Negative.   Gastrointestinal: Negative.   Genitourinary: Negative.   Musculoskeletal: Negative.   Skin: Negative.   Neurological: Negative.   Psychiatric/Behavioral: Negative.     Blood pressure 109/71, pulse 100, temperature 98.5 F (36.9 C), temperature source Oral, resp. rate 18, last menstrual period 08/29/2022, SpO2 100%. There is no height or weight on file to calculate BMI.  Demographic Factors:  NA  Loss Factors: NA  Historical Factors: NA  Risk Reduction Factors:   Sense of responsibility to family, Employed, Living with another person, especially a relative, Positive social support, Positive therapeutic relationship, and Positive coping skills or problem solving skills  Continued Clinical Symptoms:  Previous Psychiatric Diagnoses and Treatments  Cognitive Features That Contribute To Risk:  None    Suicide Risk:  Minimal: No  identifiable suicidal ideation.  Patients presenting with no risk factors but with morbid ruminations; may be classified as minimal risk based on the severity of the depressive symptoms  Plan Of Care/Follow-up recommendations:  Follow up with outpatient psychiatry at [facility]. Appointments scheduled on your behalf for medication management and individual counseling.   Medication: -Escitalopram 5 mg daily/mood  Disposition: Discharge  Lenard Lance, FNP 09/01/2022, 1:42 PM

## 2022-09-01 NOTE — ED Notes (Signed)
Patient A&O x 4, ambulatory. Patient discharged in no acute distress. Patient denied SI/HI, A/VH upon discharge. Patient verbalized understanding of all discharge instructions explained by staff, to include follow up appointments, RX's and safety plan. Patient reported mood 10/10.  Pt belongings returned to patient from locker 32 #  intact. Patient escorted to lobby via staff for transport to destination. Safety maintained.

## 2022-09-02 ENCOUNTER — Ambulatory Visit (HOSPITAL_COMMUNITY)
Admission: EM | Admit: 2022-09-02 | Discharge: 2022-09-02 | Disposition: A | Discharge status: Home / Self Care | Payer: No Payment, Other | Attending: Behavioral Health | Admitting: Behavioral Health

## 2022-09-02 DIAGNOSIS — F411 Generalized anxiety disorder: Secondary | ICD-10-CM | POA: Insufficient documentation

## 2022-09-02 DIAGNOSIS — F410 Panic disorder [episodic paroxysmal anxiety] without agoraphobia: Secondary | ICD-10-CM | POA: Insufficient documentation

## 2022-09-02 DIAGNOSIS — Z79899 Other long term (current) drug therapy: Secondary | ICD-10-CM | POA: Insufficient documentation

## 2022-09-02 NOTE — Discharge Instructions (Addendum)
Discharge recommendations:   Medications: Patient is to take medications as prescribed. The patient or patient's guardian is to contact a medical professional and/or outpatient provider to address any new side effects that develop. The patient or the patient's guardian should update outpatient providers of any new medications and/or medication changes.   Outpatient Follow up: Please review list of outpatient resources for psychiatry and counseling. Please follow up with your primary care provider for all medical related needs.   Therapy: We recommend that patient participate in individual therapy to address mental health concerns.  Safety:   The following safety precautions should be taken:   No sharp objects. This includes scissors, razors, scrapers, and putty knives.   Chemicals should be removed and locked up.   Medications should be removed and locked up.   Weapons should be removed and locked up. This includes firearms, knives and instruments that can be used to cause injury.   The patient should abstain from use of illicit substances/drugs and abuse of any medications.  If symptoms worsen or do not continue to improve or if the patient becomes actively suicidal or homicidal then it is recommended that the patient return to the closest hospital emergency department, the [facility], or call 911 for further evaluation and treatment. National Suicide Prevention Lifeline 1-800-SUICIDE or 1-800-273-8255.  About 988 988 offers 24/7 access to trained crisis counselors who can help people experiencing mental health-related distress. People can call or text 988 or chat 988lifeline.org for themselves or if they are worried about a loved one who may need crisis support.      

## 2022-09-02 NOTE — ED Notes (Signed)
Patient Is discharging at this time. Printed AVS reviewed with patient by provider along with follow up appointments and resources. Patient denies SI, HI, and A/V/H. Valuables/belongings returned to patient. No s/s of current distress.  

## 2022-09-02 NOTE — ED Provider Notes (Signed)
Behavioral Health Urgent Care Medical Screening Exam  Patient Name: Nancy Hurst MRN: [id number] Date of Evaluation: 09/02/22 Chief Complaint:  worsening anxiety  Diagnosis:  Final diagnoses:  Panic attacks  GAD (generalized anxiety disorder)    History of Present illness: Nancy Hurst is a 32 y.o. female patient with a past psychiatric history significant for generalized anxiety disorder and panic attacks who presented to the [facility] behavioral health urgent care voluntary accompanied by her husband Vida Rigger with a c/o worsening anxiety.   Patient seen and evaluated face-to-face by this provider using a Spanish interpreter with her husband present. On evaluation, patient is alert and oriented x 4. Her thought process is linear and speech is clear and coherent. Her mood is anxious and affect is congruent. She has fair eye contact. She denies SI/HI/AVH. There is no objective evidence that the patient is currently responding to internal or external stimuli. She is cooperative and does not appear to be in acute distress. Patient states that she needs to see if she can get an injection to help calm her nerves. She reports having side effects to starting the Lexapro. She reports nausea and vomiting after taking the first pill Tuesday. I dicussed with the patient that nausea is a common side effects and should subside over time as her body adjust to the medication. Patient encourage to continue taking Lexapro and if she continues to experience side effects to the medication than she can discuss with her outpatient psychiatrist on September 07, 2022, stopping the medication and discuss alternative medications to treat anxiety. She describes her anxiety symptoms as "strong anxiety" and "severe." She endorses anxiety symptoms of palpitations, sweats, tremors and sometimes feeling like something bad is going to happen. She states that she is unable to control her anxiety.  She is unable to identity recent stressors or triggers attributing to her symptoms. She is unable to identify coping mechanisms to reduce anxiety. She states that she is currently unemployed and that she went to work for one week and has not been back. No past of present history of substance abuse. Patient resides with her husband. Patient does not have access to firearms in the home. No reported family psychiatric history.  Plan: Continue Lexapro 5 mg po daily, recommend taking in the morning and taking with food to reduce nausea. Patient encouraged to attend open access there at the [facility] outpatient tomorrow during walk-in hours for therapy/psychiatry. Appointment scheduled for September 07, 2022 with Dr. Hazle Quant at 8:00 am for medication management. Appointment scheduled for 09/28/22 with Alcario Drought at 1:00 pm for therapy. Patient provided with educational handouts on how to mange anxiety, mindfulness stress reduction and information on taking Lexapro.   Flowsheet Row ED from 09/02/2022 in [facility] ED from 09/01/2022 in [facility] ED from 08/30/2022 in [facility] Emergency Department at [facility]  C-SSRS RISK CATEGORY No Risk No Risk No Risk       Psychiatric Specialty Exam  Presentation  General Appearance:Appropriate for Environment  Eye Contact:Fair  Speech:Clear and Coherent  Speech Volume:Normal  Handedness:Right   Mood and Affect  Mood: Anxious  Affect: Congruent   Thought Process  Thought Processes: Coherent  Descriptions of Associations:Intact  Orientation:Full (Time, Place and Person)  Thought Content:Logical    Hallucinations:None  Ideas of Reference:None  Suicidal Thoughts:No Without Intent; Without Plan  Homicidal Thoughts:No   Sensorium  Memory: Immediate Fair; Recent Fair; Remote Fair  Judgment: Fair  Insight: Fair   Chartered certified accountant: Fair  Attention  Span: Fair  Recall: [employer] of Knowledge: Fair  Language: Fair   Psychomotor Activity  Psychomotor Activity: Normal   Assets  Assets: Manufacturing systems engineer; Desire for Improvement; Financial Resources/Insurance; Housing; Intimacy; Leisure Time; Physical Health; Social Support   Sleep  Sleep: Fair  Number of hours: No data recorded  Physical Exam: Physical Exam HENT:     Head: Normocephalic.     Nose: Nose normal.  Eyes:     Conjunctiva/sclera: Conjunctivae normal.  Cardiovascular:     Rate and Rhythm: Normal rate.  Musculoskeletal:        General: Normal range of motion.     Cervical back: Normal range of motion.  Neurological:     Mental Status: She is alert and oriented to person, place, and time.    Review of Systems  Constitutional: Negative.   HENT: Negative.    Eyes: Negative.   Respiratory: Negative.    Cardiovascular: Negative.   Gastrointestinal: Negative.   Genitourinary: Negative.   Musculoskeletal: Negative.   Neurological: Negative.   Endo/Heme/Allergies: Negative.   Psychiatric/Behavioral:  The patient is nervous/anxious.    Blood pressure 109/74, pulse 94, temperature 98.1 F (36.7 C), temperature source Oral, resp. rate 18, last menstrual period 08/29/2022, SpO2 99%. There is no height or weight on file to calculate BMI.  Musculoskeletal: Strength & Muscle Tone: within normal limits Gait & Station: normal Patient leans: N/A   [facility] MSE Discharge Disposition for Follow up and Recommendations: Based on my evaluation the patient does not appear to have an emergency medical condition and can be discharged with resources and follow up care in outpatient services for Medication Management, Individual Therapy, and Group Therapy  Discharge recommendations:   Medications: Patient is to take medications as prescribed. The patient or patient's guardian is to contact a medical professional and/or outpatient provider to address any new side  effects that develop. The patient or the patient's guardian should update outpatient providers of any new medications and/or medication changes.   Outpatient Follow up: Appointment scheduled for September 07, 2022 with Dr. Hazle Quant at 8:00 am for medication management. Appointment scheduled for 09/28/22 with Alcario Drought at 1:00 pm for therapy. Patient encouraged to attend open access there at the [facility] outpatient tomorrow during walk-in hours for therapy/psychiatry.   Therapy: We recommend that patient participate in individual therapy to address mental health concerns.   Safety:   The following safety precautions should be taken:   No sharp objects. This includes scissors, razors, scrapers, and putty knives.   Chemicals should be removed and locked up.   Medications should be removed and locked up.   Weapons should be removed and locked up. This includes firearms, knives and instruments that can be used to cause injury.   The patient should abstain from use of illicit substances/drugs and abuse of any medications.  If symptoms worsen or do not continue to improve or if the patient becomes actively suicidal or homicidal then it is recommended that the patient return to the closest hospital emergency department, the [facility], or call 911 for further evaluation and treatment. National Suicide Prevention Lifeline 1-800-SUICIDE or 717-267-9427.  About 988 988 offers 24/7 access to trained crisis counselors who can help people experiencing mental health-related distress. People can call or text 988 or chat 988lifeline.org for themselves or if they are worried about a loved one who may need crisis support.  Layla Barter, NP 09/02/2022, 4:03 PM

## 2022-09-02 NOTE — Progress Notes (Signed)
   09/02/22 1518  [facility] Triage Screening (Walk-ins at [facility] only)  How Did You Hear About [place]? Family/Friend  What Is the Reason for Your Visit/Call Today? Pt presents to [facility] voluntarily, accompanied by her significant other with complaint of worsening anxiety,nausea,and headaches for the past month. Pt was evaluated yesterday at [facility] and she also has a follow-up appointment with [facility]-outpatient on July 30th. Pt reports hearing buzzing in her ears at times when she is sleeping. Pt states she was given medication on tuesday by a provider at [facility] and it is giving her nausea.Pt denies SI/HI and AVH.  How Long Has This Been Causing You Problems? 1 wk - 1 month  Have You Recently Had Any Thoughts About Hurting Yourself? No  Are You Planning to Commit Suicide/Harm Yourself At This time? No  Have you Recently Had Thoughts About Hurting Someone Karolee Ohs? No  Are You Planning To Harm Someone At This Time? No  Are you currently experiencing any auditory, visual or other hallucinations? No  Have You Used Any Alcohol or Drugs in the Past 24 Hours? No  Do you have any current medical co-morbidities that require immediate attention? No  Clinician description of patient physical appearance/behavior: nervous, casually dressed,  What Do You Feel Would Help You the Most Today? Treatment for Depression or other mood problem  If access to [facility] was not available, would you have sought care in the Emergency Department? No  Determination of Need Routine (7 days)  Options For Referral Outpatient Therapy;Medication Management

## 2022-09-03 ENCOUNTER — Encounter (HOSPITAL_COMMUNITY): Payer: Self-pay

## 2022-09-03 ENCOUNTER — Ambulatory Visit (HOSPITAL_COMMUNITY): Payer: No Payment, Other | Admitting: Physician Assistant

## 2022-09-06 NOTE — Progress Notes (Unsigned)
Psychiatric Initial Adult Assessment  Patient Identification: Nancy Hurst MRN:  [id number] Date of Evaluation:  09/07/2022 Referral Source: Nancy Bottom, MD  Assessment:  Nancy Hurst is a 32 y.o. female with a history of panic disorder who presents in person to [facility] for initial evaluation of medication management of panic attacks.  Patient reports continued feelings of anxiety that have persisted despite starting lexapro 1 week ago. Based on assessment, she meets criteria for generalized anxiety disorder with panic attacks. While there is no clear trigger for these panic attacks, it appears that it is in the setting of elevated anxiety. Switching to sertraline 25 mg daily for anxiety given she complained of multiple side effects from lexapro. Retrialing hydroxyzine at lower dose for anxiety PRN and trazodone for sleep. She will follow up in 4 weeks. Advised to stop BioElectro due to it having caffeine and she complained of heart palpitations.   Plan:  # Generalized Anxiety Disorder with Panic Attacks Past medication trials:  Status of problem: active Interventions: -- Discontinued lexapro -- START sertraline 25 mg daily for anxiety -- START hydroxyzine 10 mg tid prn for anxiety -- START trazodone 25-50 mg at bedtime prn for insomnia -- Alternative options discussed: propranolol, duloxetine -- Labs ordered: vitamin D, B12, TSH  # Migraines -Refer to family practice to establish care  Patient was given contact information for behavioral health clinic and was instructed to call 911 for emergencies.   Subjective:  Chief Complaint:  Chief Complaint  Patient presents with   Medication Management    History of Present Illness:   Patient presents with in person Spanish interpreter and husband. She had been recently hospitalized at [facility] but promptly discharged the same day. She refused medications at that time. 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 She describes panic attack as chest palpitations, diaphoresis, dyspnea. She states she has approximately 2-3 panic attacks per day recently and her anxiety level has been persistently elevated for the past few weeks.  She reports the only time she has had a panic attack prior to her recent promotion to team leader has been a singular episode of chest palpitations last year to which she was unclear whether this was a panic attack or not.  She states that she has been to the ED several times and had started taking Lexapro 5 mg for approximately 1 week.  She states that she initially felt significant nausea but that has since resolved.  She does report continued weakness, sharp headaches, and fatigue that started only after the Lexapro.  We discussed she is likely experiencing some of these due to Lexapro but some of these may be due to the other factors.  While she states that she has not been working with any new household products, she does report that she has been cleaning many new houses which could be a factor to her persistent headaches.  She characterizes these headaches as photophobia and sharp pain behind the eyes.  She states sitting in a dark space helps.  She does report that Tylenol was ineffective.  She reports taking BioElectro which is a combination of acetaminophen, caffeine, and aspirin for her headaches.  I discussed that the caffeine may contribute to her chest palpitations and she was agreeable to trying ibuprofen instead for her headaches.  She states that she does not have a PCP but was amenable to  me sending a referral for PCP.  She denies any significant depressed mood, anhedonia, or manic episodes.  Nancy Hurst denies history of trauma.  She states she is eating appropriately but sleeping poorly (4 to 5 hours per night).   She denies SI/HI/AVH.  She denies ever experiencing  psychotic symptoms in the past.  She denies any illicit substance use, alcohol use, tobacco use. Sertraline   She requested switching to zoloft for SSRI. She also wanted to retrial hydroxyzine for anxiety. Finally, she was agreeable to trial of trazodone to aid with sleep. All questions addressed.   Past Psychiatric History:  Diagnoses: Panic Disorder Medication trials: none Previous psychiatrist/therapist: denies Hospitalizations: 1 (same day discharge) Suicide attempts: denies SIB: denies Hx of violence towards others: denies Current access to guns: denies Hx of trauma/abuse: denies  Substance Abuse History in the last 12 months:  No.  Past Medical History:  Past Medical History:  Diagnosis Date   Anxiety    Infertility, female    Tuberculosis     Past Surgical History:  Procedure Laterality Date   BREAST SURGERY     left breast   IR PARACENTESIS  01/26/2019   IR THORACENTESIS ASP PLEURAL SPACE W/IMG GUIDE  01/25/2019   LAPAROSCOPIC APPENDECTOMY N/A 11/03/2017   Procedure: APPENDECTOMY LAPAROSCOPIC;  Surgeon: Nancy Lint, MD;  Location: [facility];  Service: General;  Laterality: N/A;    Family Psychiatric History:  denies  Family History:  Family History  Problem Relation Age of Onset   Hypertension Mother     Social History:   Academic/Vocational: team leader of house cleaning  Social History   Socioeconomic History   Marital status: Single    Spouse name: Not on file   Number of children: 0   Years of education: Not on file   Highest education level: 9th grade  Occupational History   Not on file  Tobacco Use   Smoking status: Never   Smokeless tobacco: Never  Vaping Use   Vaping status: Never Used  Substance and Sexual Activity   Alcohol use: No   Drug use: No   Sexual activity: Yes    Partners: Male    Birth control/protection: None  Other Topics Concern   Not on file  Social History Narrative   ** Merged History Encounter **       Social  Determinants of Health   Financial Resource Strain: Not on file  Food Insecurity: No Food Insecurity (08/05/2022)   Hunger Vital Sign    Worried About Running Out of Food in the Last Year: Never true    Ran Out of Food in the Last Year: Never true  Transportation Needs: No Transportation Needs (08/05/2022)   PRAPARE - Administrator, Civil Service (Medical): No    Lack of Transportation (Non-Medical): No  Physical Activity: Not on file  Stress: Not on file  Social Connections: Not on file    Additional Social History: updated  Allergies:   Allergies  Allergen Reactions   Hydroxyzine Anxiety and Other (See Comments)    Worsened patient's anxiety she said (25 mg tablet form)    Current Medications: Current Outpatient Medications  Medication Sig Dispense Refill   hydrOXYzine (ATARAX) 10 MG tablet Take 1 tablet (10 mg total) by mouth 3 (three) times daily as needed. 30 tablet 0   sertraline (ZOLOFT) 25 MG tablet Take 1 tablet (25 mg total) by mouth daily. 30 tablet 1   traZODone (DESYREL) 50 MG tablet Take  0.5-1 tablets (25-50 mg total) by mouth at bedtime. 30 tablet 0   No current facility-administered medications for this visit.    ROS: Review of Systems  Objective:  Psychiatric Specialty Exam: Last menstrual period 08/29/2022.There is no height or weight on file to calculate BMI.  General Appearance: Casual  Eye Contact:  Fair  Speech:  Clear and Coherent and Normal Rate  Volume:  Normal  Mood:  Anxious  Affect:  Appropriate and Congruent  Thought Content: Logical   Suicidal Thoughts:  No  Homicidal Thoughts:  No  Thought Process:  Coherent, Goal Directed, and Linear  Orientation:  Full (Time, Place, and Person)    Memory:  Grossly intact   Judgment:  Fair  Insight:  Fair  Concentration:  Concentration: Fair and Attention Span: Fair  Recall:  not formally assessed   Fund of Knowledge: Fair  Language: Fair  Psychomotor Activity:  Increased   Akathisia:  No  AIMS (if indicated): not done  Assets:  Communication Skills Desire for Improvement Housing Intimacy Leisure Time Physical Health Resilience Social Support Talents/Skills Transportation Vocational/Educational  ADL's:  Intact  Cognition: WNL  Sleep:  Poor   PE: General: well-appearing; no acute distress  Pulm: no increased work of breathing on room air  Strength & Muscle Tone: within normal limits Neuro: no focal neurological deficits observed  Gait & Station: normal  Metabolic Disorder Labs: No results found for: "HGBA1C", "MPG" No results found for: "PROLACTIN" No results found for: "CHOL", "TRIG", "HDL", "CHOLHDL", "VLDL", "LDLCALC" Lab Results  Component Value Date   TSH 0.489 08/02/2022    Therapeutic Level Labs: No results found for: "LITHIUM" No results found for: "CBMZ" No results found for: "VALPROATE"  Screenings:  Flowsheet Row ED from 09/02/2022 in [facility] ED from 09/01/2022 in [facility] ED from 08/30/2022 in [facility] Emergency Department at [facility]  C-SSRS RISK CATEGORY No Risk No Risk No Risk       Collaboration of Care: Collaboration of Care:   Patient/Guardian was advised Release of Information must be obtained prior to any record release in order to collaborate their care with an outside provider. Patient/Guardian was advised if they have not already done so to contact the registration department to sign all necessary forms in order for [place] to release information regarding their care.   Consent: Patient/Guardian gives verbal consent for treatment and assignment of benefits for services provided during this visit. Patient/Guardian expressed understanding and agreed to proceed.   A total of 40 minutes was spent involved in face to face clinical care, chart review, documentation.   Park Pope, MD 7/30/20241:36 PM

## 2022-09-07 ENCOUNTER — Ambulatory Visit (INDEPENDENT_AMBULATORY_CARE_PROVIDER_SITE_OTHER): Payer: No Payment, Other | Admitting: Student

## 2022-09-07 ENCOUNTER — Ambulatory Visit (HOSPITAL_COMMUNITY): Payer: No Payment, Other | Admitting: Student

## 2022-09-07 ENCOUNTER — Encounter (HOSPITAL_COMMUNITY): Payer: Self-pay | Admitting: Student

## 2022-09-07 DIAGNOSIS — G43919 Migraine, unspecified, intractable, without status migrainosus: Secondary | ICD-10-CM | POA: Diagnosis not present

## 2022-09-07 DIAGNOSIS — F410 Panic disorder [episodic paroxysmal anxiety] without agoraphobia: Secondary | ICD-10-CM

## 2022-09-07 DIAGNOSIS — F411 Generalized anxiety disorder: Secondary | ICD-10-CM

## 2022-09-07 MED ORDER — SERTRALINE HCL 25 MG PO TABS: 25.0000 mg | ORAL_TABLET | Freq: Every day | ORAL | 1 refills | Status: DC

## 2022-09-07 MED ORDER — TRAZODONE HCL 50 MG PO TABS
25.0000 mg | ORAL_TABLET | Freq: Every day | ORAL | 0 refills | Status: DC
Start: 2022-09-07 — End: 2022-10-08

## 2022-09-07 MED ORDER — HYDROXYZINE HCL 10 MG PO TABS: 10.0000 mg | ORAL_TABLET | Freq: Three times a day (TID) | ORAL | 0 refills | Status: DC | PRN

## 2022-09-09 NOTE — Addendum Note (Signed)
Addended by: Theodoro Kos A on: 09/09/2022 10:46 AM   Modules accepted: Level of Service

## 2022-09-13 ENCOUNTER — Telehealth (HOSPITAL_COMMUNITY): Payer: Self-pay | Admitting: Student

## 2022-09-13 DIAGNOSIS — F411 Generalized anxiety disorder: Secondary | ICD-10-CM

## 2022-09-13 MED ORDER — GABAPENTIN 100 MG PO CAPS: 100.0000 mg | ORAL_CAPSULE | Freq: Two times a day (BID) | ORAL | 0 refills | Status: DC | PRN

## 2022-09-13 MED ORDER — ONDANSETRON HCL 4 MG PO TABS: 4.0000 mg | ORAL_TABLET | Freq: Three times a day (TID) | ORAL | 0 refills | Status: DC | PRN

## 2022-09-13 NOTE — Telephone Encounter (Signed)
Called patient with spanish interpreter over phone. Patient complained that she tried the hydroxyzine today and it resulted in severe side effects. She states she is tolerating the sertraline. She reports feeling very shaky, worsening anxiety, mild diarrhea, and significant nausea.   She would like to be seen sooner to be evaluated. I discussed having her be seen Friday at 10 AM and she was agreeable to this. In the meantime, she was amenable to being started on gabapentin 100 mg bid prn for anxiety. I also have sent in for 10 tablets of zofran to aid with nausea to her preferred pharmacy. She was agreeable to plan. She is willing to take an injection if it would help. I stated that would not be indicated at this time and we will try PO medications for now. She verbalized understanding.   She understands to go to [facility] if she continues to experience significant side effects or severe uncontrollable anxiety.  Plan: Generalized Anxiety Disorder -STOP hydroxyzine -START gabapentin 100 mg bid PRN for anxiety -START ondansetron 4 mg for nausea -Could consider buspirone if patient continues to experience significant side effects   Park Pope, MD Psychiatry Resident, PGY3

## 2022-09-17 ENCOUNTER — Ambulatory Visit (HOSPITAL_COMMUNITY): Payer: No Payment, Other | Admitting: Student

## 2022-09-17 DIAGNOSIS — Z0000 Encounter for general adult medical examination without abnormal findings: Secondary | ICD-10-CM

## 2022-09-17 DIAGNOSIS — F411 Generalized anxiety disorder: Secondary | ICD-10-CM

## 2022-09-17 MED ORDER — ONE-A-DAY WOMENS PO TABS
1.0000 | ORAL_TABLET | Freq: Every day | ORAL | 0 refills | Status: DC
Start: 2022-09-17 — End: 2022-10-08

## 2022-09-17 NOTE — Progress Notes (Signed)
BH MD Outpatient Progress Note  09/17/2022 10:46 AM Nancy Hurst  MRN:  [id number]  Assessment:  Nancy Hurst presents for follow-up evaluation in-person. Today, 09/17/22, patient reports overall improvement in her mental health symptoms. She has not required gabapentin for severe anxiety since event a few days ago.  She continues to endorse heart palpitations and dyspnea when she lays down. I encouraged her to establish care with PCP and referred her to Dr. Ardyth Harps to schedule an appointment. Patient will follow up in 3 weeks to evaluate for need to uptitrate medications.   Identifying Information: Nancy Hurst is a 32 y.o. female with a history of GAD with panic attacks and migraines who is an established patient with [facility] for medication management.   Plan:  # Generalized Anxiety Disorder with Panic Attacks Past medication trials: lexapro (intolerable), hydroxyzine (shaking and vomiting) Status of problem: active Interventions: -- Discontinued hydroxyzine -- Continue sertraline 25 mg daily for anxiety -- Continue gabapentin 100 mg bid prn for anxiety -- Continue trazodone 25-50 mg at bedtime prn for insomnia -- START multivitamin daily -- Alternative options discussed: propranolol, duloxetine -- Labs ordered: vitamin D, B12, magnesium, TSH, lipid panel, a1c -- Referred to [facility] [place] for psychotherapy  # Migraines -Referral to Dr. Ardyth Harps to establish care  Return to care in 3 weeks  Patient was given contact information for behavioral health clinic and was instructed to call 911 for emergencies.    Patient and plan of care will be discussed with the Attending MD, Dr. Jerold Coombe, who agrees with the above statement and plan.   Subjective:  Chief Complaint: Medication Management   Interval History:  Patient presents with spanish interpreter and husband. She denies SI/HI/AVH. She  reports feeling somewhat better at this time. She endorses continued feelings of chest palpitations and dyspnea but they do not necessarily always associated with anxiety.  I advised patient to establish care with a primary care doctor in order to rule out any other medical etiologies for her physical symptoms. She does have a follow up with a cardiologist in a week to r/o any cardiologic etiologies.   She states she has not taken any as needed medications but is compliant with her Zoloft at this time.  She did wonder whether her symptoms are related to low magnesium or any other vitamin deficiencies as her symptoms align with hypomagnesemia.  I discussed that this is certainly a possibility and hence why she should see a primary care doctor to ensure that there are no further reasons for her symptoms beyond anxiety. She was agreeable to this.  She asked that I prescribe her a multivitamin tablet that did not contain caffeine as caffeine has increased her anxiety in the past.  I discussed that I would send a prescription to the pharmacy but there are also many available over-the-counter options for her.  She verbalized understanding but she would like a prescription sent for this.    Visit Diagnosis:    ICD-10-CM   1. Generalized anxiety disorder  F41.1 Magnesium    Multiple Vitamins-Minerals (ONE-A-DAY WOMENS) tablet      Past Psychiatric History:  Diagnoses: GAD with panic Disorder Medication trials: none Previous psychiatrist/therapist: denies Hospitalizations: 1 (same day discharge) Suicide attempts: denies SIB: denies Hx of violence towards others: denies Current access to guns: denies Hx of trauma/abuse: denies  Past Medical History:  Past Medical History:  Diagnosis Date   Anxiety    Infertility, female  Tuberculosis     Past Surgical History:  Procedure Laterality Date   BREAST SURGERY     left breast   IR PARACENTESIS  01/26/2019   IR THORACENTESIS ASP PLEURAL SPACE  W/IMG GUIDE  01/25/2019   LAPAROSCOPIC APPENDECTOMY N/A 11/03/2017   Procedure: APPENDECTOMY LAPAROSCOPIC;  Surgeon: Almond Lint, MD;  Location: [facility];  Service: General;  Laterality: N/A;    Family Psychiatric History: denies  Family History:  Family History  Problem Relation Age of Onset   Hypertension Mother     Social History:  Academic/Vocational: team leader of maid service Social History   Socioeconomic History   Marital status: Single    Spouse name: Not on file   Number of children: 0   Years of education: Not on file   Highest education level: 9th grade  Occupational History   Not on file  Tobacco Use   Smoking status: Never   Smokeless tobacco: Never  Vaping Use   Vaping status: Never Used  Substance and Sexual Activity   Alcohol use: No   Drug use: No   Sexual activity: Yes    Partners: Male    Birth control/protection: None  Other Topics Concern   Not on file  Social History Narrative   ** Merged History Encounter **       Social Determinants of Health   Financial Resource Strain: Not on file  Food Insecurity: No Food Insecurity (08/05/2022)   Hunger Vital Sign    Worried About Running Out of Food in the Last Year: Never true    Ran Out of Food in the Last Year: Never true  Transportation Needs: No Transportation Needs (08/05/2022)   PRAPARE - Administrator, Civil Service (Medical): No    Lack of Transportation (Non-Medical): No  Physical Activity: Not on file  Stress: Not on file  Social Connections: Not on file    Allergies:  No Known Allergies  Current Medications: Current Outpatient Medications  Medication Sig Dispense Refill   Multiple Vitamins-Minerals (ONE-A-DAY WOMENS) tablet Take 1 tablet by mouth daily. 30 tablet 0   gabapentin (NEURONTIN) 100 MG capsule Take 1 capsule (100 mg total) by mouth 2 (two) times daily as needed (Anxiety). 20 capsule 0   ondansetron (ZOFRAN) 4 MG tablet Take 1 tablet (4 mg total) by mouth  every 8 (eight) hours as needed for nausea or vomiting. 10 tablet 0   sertraline (ZOLOFT) 25 MG tablet Take 1 tablet (25 mg total) by mouth daily. 30 tablet 1   traZODone (DESYREL) 50 MG tablet Take 0.5-1 tablets (25-50 mg total) by mouth at bedtime. 30 tablet 0   No current facility-administered medications for this visit.    ROS: Review of Systems   Objective:  Psychiatric Specialty Exam: Last menstrual period 08/29/2022.There is no height or weight on file to calculate BMI.  General Appearance: Fairly Groomed  Eye Contact: Fair  Speech:  Clear and Coherent  Volume:  Normal  Mood:  Anxious  Affect:  Appropriate and Congruent  Thought Content: Logical   Suicidal Thoughts:  No  Homicidal Thoughts:  No  Thought Process:  Coherent, Goal Directed, and Linear  Orientation:  Full (Time, Place, and Person)    Memory: Remote;   Fair  Judgment:  Fair  Insight:  Fair  Concentration:  Concentration: Fair  Recall: not formally assessed   Fund of Knowledge: Fair  Language: Fair  Psychomotor Activity:  Normal  Akathisia:  No  AIMS (if indicated):  not done  Assets:  Communication Skills Desire for Improvement Financial Resources/Insurance Housing Intimacy Leisure Time Physical Health Resilience Social Support Talents/Skills Transportation Vocational/Educational  ADL's:  Intact  Cognition: WNL  Sleep:  Fair   PE: General: well-appearing; no acute distress  Pulm: no increased work of breathing on room air  Strength & Muscle Tone: within normal limits Neuro: no focal neurological deficits observed  Gait & Station: normal  Metabolic Disorder Labs: No results found for: "HGBA1C", "MPG" No results found for: "PROLACTIN" No results found for: "CHOL", "TRIG", "HDL", "CHOLHDL", "VLDL", "LDLCALC" Lab Results  Component Value Date   TSH 0.489 08/02/2022    Therapeutic Level Labs: No results found for: "LITHIUM" No results found for: "VALPROATE" No results found for:  "CBMZ"  Screenings: Flowsheet Row ED from 09/02/2022 in [facility] ED from 09/01/2022 in [facility] ED from 08/30/2022 in [facility] Emergency Department at [facility]  C-SSRS RISK CATEGORY No Risk No Risk No Risk       Collaboration of Care: Collaboration of Care:  Patient/Guardian was advised Release of Information must be obtained prior to any record release in order to collaborate their care with an outside provider. Patient/Guardian was advised if they have not already done so to contact the registration department to sign all necessary forms in order for [place] to release information regarding their care.   Consent: Patient/Guardian gives verbal consent for treatment and assignment of benefits for services provided during this visit. Patient/Guardian expressed understanding and agreed to proceed.   Park Pope, MD 09/17/2022, 10:46 AM

## 2022-09-23 DIAGNOSIS — R002 Palpitations: Secondary | ICD-10-CM | POA: Insufficient documentation

## 2022-09-23 NOTE — Progress Notes (Signed)
Cardiology Office Note   Date:  09/24/2022   ID:  Nancy Hurst, DOB [birth year], MRN [id number]  PCP:  Patient, No Pcp Per  Cardiologist:   None Referring:  Patient, No Pcp Per  Chief Complaint  Patient presents with   Palpitations      History of Present Illness: Nancy Hurst is a 32 y.o. female who presents for   evaluation of palpitations.  She has no past cardiac history.  She does have a history of anxiety and panic that is being managed.  She apparently just switched some of her medicines so she is waiting for that to kick in.  She gets palpitations randomly.  It happens sometimes at night.  Happen sometimes during the day.  She feels her heart racing for minutes at a time.  He does not of her head.  She can get it in her ears.  She says she is actually had syncope 4-5 times.  She has not had trauma with this.  The last time was maybe 4 weeks ago.  She is not describing chest pressure, neck or arm discomfort.  She might get short of breath.  She is not having PND or orthopnea.  She has had some weight loss because she is not eating well.  She wonders if this could be the medicines.  I do see some blood work to include a normal CBC, electrolytes and TSH.  She has not had any prior cardiac workup.  An interpreter was used.   Past Medical History:  Diagnosis Date   Anxiety    Infertility, female    Tuberculosis     Past Surgical History:  Procedure Laterality Date   BREAST SURGERY     left breast   IR PARACENTESIS  01/26/2019   IR THORACENTESIS ASP PLEURAL SPACE W/IMG GUIDE  01/25/2019   LAPAROSCOPIC APPENDECTOMY N/A 11/03/2017   Procedure: APPENDECTOMY LAPAROSCOPIC;  Surgeon: Almond Lint, MD;  Location: [facility];  Service: General;  Laterality: N/A;     Current Outpatient Medications  Medication Sig Dispense Refill   gabapentin (NEURONTIN) 100 MG capsule Take 1 capsule (100 mg total) by mouth 2 (two) times daily as needed (Anxiety). 20  capsule 0   ondansetron (ZOFRAN) 4 MG tablet Take 1 tablet (4 mg total) by mouth every 8 (eight) hours as needed for nausea or vomiting. 10 tablet 0   sertraline (ZOLOFT) 25 MG tablet Take 1 tablet (25 mg total) by mouth daily. 30 tablet 1   traZODone (DESYREL) 50 MG tablet Take 0.5-1 tablets (25-50 mg total) by mouth at bedtime. 30 tablet 0   Multiple Vitamins-Minerals (ONE-A-DAY WOMENS) tablet Take 1 tablet by mouth daily. (Patient not taking: Reported on 09/24/2022) 30 tablet 0   No current facility-administered medications for this visit.    Allergies:   Patient has no known allergies.    Social History:  The patient  reports that she has never smoked. She has never used smokeless tobacco. She reports that she does not drink alcohol and does not use drugs.   Family History:  The patient's  family history includes Hypertension in her mother.    ROS:  Please see the history of present illness.   Otherwise, review of systems are positive for none.   All other systems are reviewed and negative.    PHYSICAL EXAM: VS:  BP 102/70 (BP Location: Left Arm, Patient Position: Sitting, Cuff Size: Normal)   Pulse 89   Ht 5\' 2"  (1.575  m)   Wt 105 lb (47.6 kg)   LMP 08/29/2022 (Exact Date)   SpO2 99%   BMI 19.20 kg/m  , BMI Body mass index is 19.2 kg/m. GENERAL:  Well appearing HEENT:  Pupils equal round and reactive, fundi not visualized, oral mucosa unremarkable NECK:  No jugular venous distention, waveform within normal limits, carotid upstroke brisk and symmetric, no bruits, no thyromegaly LYMPHATICS:  No cervical, inguinal adenopathy LUNGS:  Clear to auscultation bilaterally BACK:  No CVA tenderness CHEST:  Unremarkable HEART:  PMI not displaced or sustained,S1 and S2 within normal limits, no S3, no S4, no clicks, no rubs, no murmurs ABD:  Flat, positive bowel sounds normal in frequency in pitch, no bruits, no rebound, no guarding, no midline pulsatile mass, no hepatomegaly, no  splenomegaly EXT:  2 plus pulses throughout, no edema, no cyanosis no clubbing SKIN:  No rashes no nodules NEURO:  Cranial nerves II through XII grossly intact, motor grossly intact throughout [facility]:  Cognitively intact, oriented to person place and time    EKG:  08/30/22  Normal sinus rhythm, rate 87, axis within normal limits, intervals within normal limits, no acute ST-T wave changes.    Recent Labs: 08/02/2022: TSH 0.489 08/07/2022: ALT 12 08/30/2022: BUN 12; Creatinine, Ser 0.54; Hemoglobin 13.8; Platelets 428; Potassium 3.5; Sodium 136    Lipid Panel No results found for: "CHOL", "TRIG", "HDL", "CHOLHDL", "VLDL", "LDLCALC", "LDLDIRECT"    Wt Readings from Last 3 Encounters:  09/24/22 105 lb (47.6 kg)  08/07/22 108 lb (49 kg)  08/05/22 108 lb 3.2 oz (49.1 kg)      Other studies Reviewed: Additional studies/ records that were reviewed today include: Labs. Review of the above records demonstrates:  Please see elsewhere in the note.     ASSESSMENT AND PLAN:  Palpitations: This may be related to anxiety but I would have her wear a 4-week monitor.  Her electrolytes and TSH were normal.  She will come back in about 6 weeks to review this.  In the meantime she will continue to have management of the anxiety and hopefully this will help.  Her exam is normal and I suspect a structurally normal heart.  Syncope: She was not orthostatic in the office today.  No further workup is planned.   Current medicines are reviewed at length with the patient today.  The patient does not have concerns regarding medicines.  The following changes have been made:  no change  Labs/ tests ordered today include:  No orders of the defined types were placed in this encounter.    Disposition:   FU with APP in six weeks.     Signed, Rollene Rotunda, MD  09/24/2022 10:27 AM    [facility]

## 2022-09-24 ENCOUNTER — Ambulatory Visit: Payer: Self-pay | Attending: Cardiology | Admitting: Cardiology

## 2022-09-24 ENCOUNTER — Encounter: Payer: Self-pay | Admitting: Cardiology

## 2022-09-24 VITALS — BP 102/70 | HR 89 | Ht 62.0 in | Wt 105.0 lb

## 2022-09-24 DIAGNOSIS — R002 Palpitations: Secondary | ICD-10-CM

## 2022-09-24 NOTE — Patient Instructions (Addendum)
Medication Instructions:  Your physician recommends that you continue on your current medications as directed. Please refer to the Current Medication list given to you today.  *If you need a refill on your cardiac medications before your next appointment, please call your pharmacy*    Testing/Procedures:  [employer] Cardiac Event Monitor Instructions  Your physician has requested you wear your cardiac event monitor for 30 days. [employer] may call or text to confirm a shipping address. The monitor will be sent to a land address via UPS. [employer] will not ship a monitor to a PO BOX. It typically takes 3-5 days to receive your monitor after it has been enrolled. [employer] will assist with USPS tracking if your package is delayed. The telephone number for [employer] is [phone]. Once you have received your monitor, please review the enclosed instructions. Instruction tutorials can also be viewed under help and settings on the enclosed cell phone. Your monitor has already been registered assigning a specific monitor serial # to you.  Billing and Self Pay Discount Information  [employer] has been provided the insurance information we had on file for you.  If your insurance has been updated, please call [employer] at [phone] to provide them with your updated insurance information.   [employer] offers a discounted Self Pay option for patients who have insurance that does not cover their cardiac event monitor or patients without insurance.  The discounted cost of a Self Pay Cardiac Event Monitor would be $225.00 , if the patient contacts [employer] at [phone] within 7 days of applying the monitor to make payment arrangements.  If the patient does not contact [employer] within 7 days of applying the monitor, the cost of the cardiac event monitor will be $350.00.  Applying the monitor  Remove cell phone from case and turn it on. The cell phone works as IT consultant and needs  to be within [employer] of you at all times. The cell phone will need to be charged on a daily basis. We recommend you plug the cell phone into the enclosed charger at your bedside table every night.  Monitor batteries: You will receive two monitor batteries labelled #1 and #2. These are your recorders. Plug battery #2 onto the second connection on the enclosed charger. Keep one battery on the charger at all times. This will keep the monitor battery deactivated. It will also keep it fully charged for when you need to switch your monitor batteries. A small light will be blinking on the battery emblem when it is charging. The light on the battery emblem will remain on when the battery is fully charged.  Open package of a Monitor strip. Insert battery #1 into black hood on strip and gently squeeze monitor battery onto connection as indicated in instruction booklet. Set aside while preparing skin.  Choose location for your strip, vertical or horizontal, as indicated in the instruction booklet. Shave to remove all hair from location. There cannot be any lotions, oils, powders, or colognes on skin where monitor is to be applied. Wipe skin clean with enclosed Saline wipe. Dry skin completely.  Peel paper labeled #1 off the back of the Monitor strip exposing the adhesive. Place the monitor on the chest in the vertical or horizontal position shown in the instruction booklet. One arrow on the monitor strip must be pointing upward. Carefully remove paper labeled #2, attaching remainder of strip to your skin. Try not to create any folds or wrinkles in the strip as you apply it.  Firmly  press and release the circle in the center of the monitor battery. You will hear a small beep. This is turning the monitor battery on. The heart emblem on the monitor battery will light up every 5 seconds if the monitor battery in turned on and connected to the patient securely. Do not push and hold the circle down as this  turns the monitor battery off. The cell phone will locate the monitor battery. A screen will appear on the cell phone checking the connection of your monitor strip. This may read poor connection initially but change to good connection within the next minute. Once your monitor accepts the connection you will hear a series of 3 beeps followed by a climbing crescendo of beeps. A screen will appear on the cell phone showing the two monitor strip placement options. Touch the picture that demonstrates where you applied the monitor strip.  Your monitor strip and battery are waterproof. You are able to shower, bathe, or swim with the monitor on. They just ask you do not submerge deeper than 3 feet underwater. We recommend removing the monitor if you are swimming in a lake, river, or ocean.  Your monitor battery will need to be switched to a fully charged monitor battery approximately once a week. The cell phone will alert you of an action which needs to be made.  On the cell phone, tap for details to reveal connection status, monitor battery status, and cell phone battery status. The green dots indicates your monitor is in good status. A red dot indicates there is something that needs your attention.  To record a symptom, click the circle on the monitor battery. In 30-60 seconds a list of symptoms will appear on the cell phone. Select your symptom and tap save. Your monitor will record a sustained or significant arrhythmia regardless of you clicking the button. Some patients do not feel the heart rhythm irregularities. [employer] will notify us of any serious or critical events.  Refer to instruction booklet for instructions on switching batteries, changing strips, the Do not disturb or Pause features, or any additional questions.  Call [employer] at [phone], to confirm your monitor is transmitting and record your baseline. They will answer any questions you may have regarding the monitor  instructions at that time.  Returning the monitor to [employer]  Place all equipment back into blue box. Peel off strip of paper to expose adhesive and close box securely. There is a prepaid UPS shipping label on this box. Drop in a UPS drop box, or at a UPS facility like [employer]. You may also contact [employer] to arrange UPS to pick up monitor package at your home.    Follow-Up: At [facility], you and your health needs are our priority.  As part of our continuing mission to provide you with exceptional heart care, we have created designated Provider Care Teams.  These Care Teams include your primary Cardiologist (physician) and Advanced Practice Providers (APPs -  Physician Assistants and Nurse Practitioners) who all work together to provide you with the care you need, when you need it.  We recommend signing up for the patient portal called "MyChart".  Sign up information is provided on this After Visit Summary.  MyChart is used to connect with patients for Virtual Visits (Telemedicine).  Patients are able to view lab/test results, encounter notes, upcoming appointments, etc.  Non-urgent messages can be sent to your provider as well.   To learn more about what you can do with  MyChart, go to [email or web address].    Your next appointment:   6-8 week(s)  Provider:   Micah Flesher, PA-C, Marjie Skiff, PA-C, Robet Leu, PA-C, Joni Reining, DNP, ANP, Azalee Course, PA-C, or Bernadene Person, NP

## 2022-09-27 ENCOUNTER — Telehealth: Payer: Self-pay | Admitting: Licensed Clinical Social Worker

## 2022-09-27 ENCOUNTER — Other Ambulatory Visit (HOSPITAL_COMMUNITY): Payer: No Payment, Other

## 2022-09-27 NOTE — Telephone Encounter (Signed)
H&V Care Navigation CSW Progress Note  Clinical Social Worker contacted patient by phone to f/u as pt has no PCP and no insurance. Was unable to reach pt this afternoon. With assistance of Spanish language interpreter Sedalia Muta, [phone], I attempted but was unable to leave voicemail. Will re-attempt again as able.  Patient is participating in a Managed Medicaid Plan:  No, self pay only  SDOH Screenings   Food Insecurity: No Food Insecurity (08/05/2022)  Housing: Low Risk  (08/05/2022)  Transportation Needs: No Transportation Needs (08/05/2022)  Utilities: Not At Risk (08/05/2022)  Tobacco Use: Low Risk  (09/24/2022)   [name], MSW, LCSW Clinical Social Worker II [facility] Heart/Vascular Care Navigation  [phone]- work cell phone (preferred) [phone]- desk phone

## 2022-09-28 ENCOUNTER — Ambulatory Visit (HOSPITAL_COMMUNITY): Payer: No Payment, Other | Admitting: Mental Health

## 2022-09-28 ENCOUNTER — Telehealth: Payer: Self-pay | Admitting: Licensed Clinical Social Worker

## 2022-09-28 DIAGNOSIS — F411 Generalized anxiety disorder: Secondary | ICD-10-CM

## 2022-09-28 DIAGNOSIS — F321 Major depressive disorder, single episode, moderate: Secondary | ICD-10-CM | POA: Insufficient documentation

## 2022-09-28 NOTE — Progress Notes (Signed)
Heart and Vascular Care Navigation  09/28/2022  Nancy Hurst [facility] [birth year] [id number]  Reason for Referral: uninsured, no PCP Patient is participating in a Managed Medicaid Plan: No, self pay only, [employer] to cover mental health visits  Engaged with patient by telephone for initial visit for Heart and Vascular Care Coordination.                                                                                                   Assessment:                 LCSW spoke with pt via telephone today with the assistance of [place], [place] language interpreter [phone]. Introduced self, role, reason for call. Pt confirmed home address, no PCP, lives with her husband [place]. She has a scheduled f/u with cardiology and an appt to establish with PCP in October. She denies issues with housing, utilities, and food (usually). She isn't currently working, her husband is employed. LCSW discussed assistance applications including Haematologist and [employer]. Pt open to completing these. LCSW encouraged pt to f/u with me as needed, I will send her applications and discussed how we could assist. No additional questions at this time.                        HRT/VAS Care Coordination     Patients Home Cardiology Office [facility]   Outpatient Care Team Social Worker   Social Worker Name: [name], [place], [phone]   Living arrangements for the past 2 months Single Family Home   Lives with: Spouse   Patient Current Insurance Coverage Self-Pay   Patient Has Concern With Paying Medical Bills Yes   Patient Concerns With Medical Bills uninsured, ongoing medical care and not Medicaid eligible   Medical Bill Referrals: [facility] Assistance and [employer]   Does Patient Have Prescription Coverage? No   Home Assistive Devices/Equipment None       Social History:                                                                             SDOH Screenings   Food  Insecurity: No Food Insecurity (09/28/2022)  Housing: Low Risk  (09/28/2022)  Transportation Needs: Unmet Transportation Needs (09/28/2022)  Utilities: Not At Risk (09/28/2022)  Alcohol Screen: Low Risk  (09/28/2022)  Depression (PHQ2-9): High Risk (09/28/2022)  Financial Resource Strain: Medium Risk (09/28/2022)  Tobacco Use: Low Risk  (09/24/2022)    SDOH Interventions: Financial Resources:  Surveyor, quantity Strain Interventions: Artist, Other (Comment) (referral to [employer] and [employer], mailed food resources) Editor, commissioning for [employer]  Food Insecurity:  Food Insecurity Interventions: Intervention Not Indicated; mailed pantries if needed  Housing Insecurity:  Housing Interventions: Intervention Not  Indicated  Transportation:   Transportation Interventions: Conservation officer, nature Given    Other Care Navigation Interventions:     Provided Pharmacy assistance resources  Pt medications may be covered by [employer] program  Patient expressed Mental Health concerns Yes, Referred to:  noted on chart is seeing [facility] therapist for management   Follow-up plan:   LCSW mailed pt the following: my card, [employer], [employer], Primary Care provider list, and food resources. I will f/u with pt if I do not hear from her to further assist with completing these applications and submit them.

## 2022-09-29 NOTE — Progress Notes (Signed)
Comprehensive Clinical Assessment (CCA) Note  09/29/2022 Dnae Kurman [id number]  Chief Complaint:  Chief Complaint  Patient presents with   Establish Care   Depression   Anxiety   Visit Diagnosis: MDD and GAD    CCA Screening, Triage and Referral (STR)  Patient Reported Information How did you hear about [place]? Other (Comment)  Referral name: Emergency room  Whom do you see for routine medical problems? I don't have a doctor  What Is the Reason for Your Visit/Call Today? " I received some bad news in April my nephew had been in car accident. He is doing better. I couldn't sleep I couldn't eat. Like a desperation. I think that triggered it. A year ago in July I had a anxiety attack but I was able to control myself. "  How Long Has This Been Causing You Problems? 1-6 months  What Do You Feel Would Help You the Most Today? Treatment for Depression or other mood problem   Have You Recently Been in Any Inpatient Treatment (Hospital/Detox/Crisis Center/28-Day Program)? No  Have You Ever Received Services From [employer] Before? No   Have You Recently Had Any Thoughts About Hurting Yourself? No  Are You Planning to Commit Suicide/Harm Yourself At This time? No   Have you Recently Had Thoughts About Hurting Someone Karolee Ohs? No  Have You Used Any Alcohol or Drugs in the Past 24 Hours? No  What Did You Use and How Much? denies  Do You Currently Have a Therapist/Psychiatrist? Yes  Name of Therapist/Psychiatrist: [facility]    CCA Screening Triage Referral Assessment Type of Contact: Face-to-Face  Is CPS involved or ever been involved? Never  Is APS involved or ever been involved? Never  Patient Determined To Be At Risk for Harm To Self or Others Based on Review of Patient Reported Information or Presenting Complaint? No  Method: No Plan  Availability of Means: No access or NA  Intent: Vague intent or NA  Notification Required: No need or identified  person  Are There Guns or Other Weapons in Your Home? No  Types of Guns/Weapons: none  Who Could Verify You Are Able To Have These Secured: none  Do You Have any Outstanding Charges, Pending Court Dates, Parole/Probation? Denies  Location of Assessment: [facility]   Does Patient Present under Involuntary Commitment? No  [place] of Residence: [place]   Patient Currently Receiving the Following Services: Medication Management   Determination of Need: Routine (7 days)   Options For Referral: Medication Management; Outpatient Therapy     CCA Biopsychosocial Intake/Chief Complaint:  " I received some bad news in April my nephew had been in car accident. He is doing better. I couldn't sleep I couldn't eat. Like a desperation. I think that triggered it. A year ago in July I had an anxiety attack but I was able to control myself. "   Topaz is a 32 year old Hispanic single female who presents for routine assessment to engage in outpatient therapy services. Presents with support of Spanish interpreter, Lily.  Shares was referred for services by emergency department following a panic attack she experienced approximately x 2 months ago. Denies history of being diagnosed with mental health disorders in the past and shares for this to be her first episode with engagement with mental health services. Currently is a patient with medication managment services with [facility]; followed by Dr. Park Pope. Share history of anxiety attack x 1 year ago in which she was  able to self-soothe and calm. Notes in April to have recieved news that a nephew of hers was in a car accident and was not doing well (since recovered) shares for this to have triggered increased sxs of anxiety as well as depressive sxs.  Current Symptoms/Problems: low mood, difficulty falling asleep, fatigue, easily tearful, excessive worry   Patient Reported Schizophrenia/Schizoaffective Diagnosis in Past:  No   Strengths: "My parents and my work really get me to move along."  Preferences: female  Abilities: "My job."   Type of Services Patient Feels are Needed: therapy   Initial Clinical Notes/Concerns: GAD MDD   Mental Health Symptoms Depression:   Fatigue; Worthlessness; Tearfulness; Sleep (too much or little); Increase/decrease in appetite; Change in energy/activity; Irritability (decreased appetite, difficulty falling asleep. Anehdonia. Denies history of suicide attempts; denies self harm)   Duration of Depressive symptoms:  Greater than two weeks   Mania:   None   Anxiety:    Worrying; Tension; Sleep; Restlessness; Fatigue; Irritability (overthinking hx of anxiety attacks)   Psychosis:   None   Duration of Psychotic symptoms: No data recorded  Trauma:   None   Obsessions:   None   Compulsions:   None   Inattention:   None   Hyperactivity/Impulsivity:   None   Oppositional/Defiant Behaviors:  No data recorded  Emotional Irregularity:   None   Other Mood/Personality Symptoms:   Shares can be easily angered; notes for anger to be explosive. Notes will shut down and not speak to the person who she is angry at. Denies becoming physically agressive    Mental Status Exam Appearance and self-care  Stature:   Average   Weight:   Thin   Clothing:   Casual   Grooming:   Well-groomed   Cosmetic use:   None   Posture/gait:   Normal   Motor activity:   Restless   Sensorium  Attention:   Normal   Concentration:   Normal   Orientation:   X5   Recall/memory:   Normal   Affect and Mood  Affect:   Appropriate   Mood:   Anxious   Relating  Eye contact:   None   Facial expression:   Sad   Attitude toward examiner:   Cooperative   Thought and Language  Speech flow:  Clear and Coherent   Thought content:   Appropriate to Mood and Circumstances   Preoccupation:   None   Hallucinations:   None   Organization:  No data  recorded  [employer] of Knowledge:   Good   Intelligence:   Average   Abstraction:   Concrete   Judgement:   Good   Reality Testing:   Realistic   Insight:   Fair   Decision Making:   Vacilates   Social Functioning  Social Maturity:   Responsible   Social Judgement:   Normal   Stress  Stressors:   Surveyor, quantity; Work   Coping Ability:   Overwhelmed; Exhausted   Skill Deficits:   Decision making   Supports:   Family     Religion: Religion/Spirituality Are You A Religious Person?: Yes What is Your Religious Affiliation?:  (" I believe in the virgin mother")  Leisure/Recreation: Leisure / Recreation Do You Have Hobbies?: Yes Leisure and Hobbies: Education officer, environmental, walking  Exercise/Diet: Exercise/Diet Do You Exercise?: Yes What Type of Exercise Do You Do?: Run/Walk How Many Times a Week Do You Exercise?: 6-7 times a week Have You Gained or Lost  A Significant Amount of Weight in the Past Six Months?: Yes-Lost Number of Pounds Lost?: 10 Do You Have Any Trouble Sleeping?: Yes Explanation of Sleeping Difficulties: difficulty falling and staying asleep   CCA Employment/Education Employment/Work Situation: Employment / Work Situation Employment Situation: Employed (Shares has been out of work for the past x 1 month- LOA) Where is Patient Currently Employed?: 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 Patient's Job has Been Impacted by Current Illness: Yes Describe how Patient's Job has Been Impacted: recently took a leave of absence due to sxs What is the Longest Time Patient has Held a Job?: 2.5 Where was the Patient Employed at that Time?: current job Has Patient ever Been in the  [employer]?: No  Education: Education Is Patient Currently Attending School?: No Last Grade Completed: 8 Did Garment/textile technologist From [employer]?: No Did You Product manager?: No Did Designer, television/film set?: No Did You Have An Individualized Education Program (IIEP): No Did You Have Any Difficulty At School?: No Patient's Education Has Been Impacted by Current Illness: No   CCA Family/Childhood History Family and Relationship History: Family history Marital status: Long term relationship Long term relationship, how long?: 5 years What types of issues is patient dealing with in the relationship?: denies Are you sexually active?: Yes What is your sexual orientation?: Heterosexual Has your sexual activity been affected by drugs, alcohol, medication, or emotional stress?: - Does patient have children?: No  Childhood History:  Childhood History By whom was/is the patient raised?: Mother Additional childhood history information: Shares to have been raised by her mother and father in [place]. Describes her childhood as "my childhood was really beautiful." Shares to have been in the states for the past x 5 years. Description of patient's relationship with caregiver when they were a child: Mother: "good."    Father: "good." Patient's description of current relationship with people who raised him/her: Mother: "good"   Father: "good" How were you disciplined when you got in trouble as a child/adolescent?: - Does patient have siblings?: Yes Number of Siblings: 10 (x 7 sisters; x 3 brothers) Description of patient's current relationship with siblings: Shares to get along with her siblings. Did patient suffer any verbal/emotional/physical/sexual abuse as a child?: No Did patient suffer from severe childhood neglect?: No Has patient ever been sexually abused/assaulted/raped as an adolescent or adult?: No Was the patient ever a victim of a crime or a disaster?: No Witnessed domestic violence?:  No Has patient been affected by domestic violence as an adult?: No  Child/Adolescent Assessment:     CCA Substance Use Alcohol/Drug Use: Alcohol / Drug Use Prescriptions: See MAR History of alcohol / drug use?: No history of alcohol / drug abuse                         ASAM's:  Six Dimensions of Multidimensional Assessment  Dimension 1:  Acute Intoxication and/or Withdrawal Potential:      Dimension 2:  Biomedical Conditions and Complications:      Dimension 3:  Emotional, Behavioral, or Cognitive Conditions and Complications:     Dimension 4:  Readiness to Change:     Dimension 5:  Relapse, Continued use, or Continued  Problem Potential:     Dimension 6:  Recovery/Living Environment:     ASAM Severity Score:    ASAM Recommended Level of Treatment:     Substance use Disorder (SUD)    Recommendations for Services/Supports/Treatments: Recommendations for Services/Supports/Treatments Recommendations For Services/Supports/Treatments: Individual Therapy, Medication Management  DSM5 Diagnoses: Patient Active Problem List   Diagnosis Date Noted   Moderate major depression, single episode (HCC) 09/28/2022   Palpitations 09/23/2022   Intractable migraine without status migrainosus 09/07/2022   Panic attacks 08/06/2022   Generalized anxiety disorder 08/04/2022   Tuberculosis, urogenital 03/08/2019   Ovarian mass 02/21/2019   S/P thoracentesis    Disseminated tuberculosis    Observation for suspected tuberculosis (TB)    Normocytic anemia 01/27/2019   Moderate protein-calorie malnutrition (HCC) 01/27/2019   Fever 01/27/2019   Biological false positive RPR test 01/27/2019   Pleural effusion on left 01/24/2019   Abdominal ascites 01/24/2019   Recurrent UTI    Perforated appendicitis 11/03/2017   Summary:   Riana is a 32 year old Hispanic single female who presents for routine assessment to engage in outpatient therapy services. Presents with support of Spanish  interpreter, Lily. Shares was referred for services by emergency department following a panic attack she experienced approximately x 2 months ago. Denies history of being diagnosed with mental health disorders in the past and shares for this to be her first episode with engagement with mental health services. Currently is a patient with medication managment services with [facility]; followed by Dr. Park Pope. Share history of anxiety attack x 1 year ago in which she was able to self-soothe and calm. Notes in April to have recieved news that a nephew of hers was in a car accident and was not doing well (since recovered) shares for this to have triggered increased sxs of anxiety as well as depressive sxs.   Ayumi presents for routine assessment alert and oriented; mood and affect low; depressed. Tearful at times. Noticeable anxious with fidgeting behaviors noted. Engaged and cooperative to assessment. Notes concerns for mental health to have started to x 1 year ago with presentation of anxiety attack last January. Shares this past April for sxs to have increased following awareness of nephew to be in hospital and not doing well. Although nephew has since recovered depression and anxiety sxs have persisted. Endorses sxs of depression of anhedonia; low mood, crying spells, fatigue with feelings of worthlessness. Reports anxiety with excessive worry, restlessness, with increased irritability. Denies mania/mood swings. Denies psychotic sxs. Denies trauma experiences or trauma sxs. Shares in times in when she is angry she can completely shut down and deny to speak to the individual she is upset with. Denies use of substances. Shares to live in stable housing with boyfriend of x 5 years. [place] 5 years; from [place]. Currently in the work force but has taken a leave of absence as she reports current sxs prevent her from working and reports work related stressors of difficulty handling recent  increase in responsibility at work. Denies legal concerns. Denies SI/HI/AVH. CSSRS, pain ,nutrition, GAD and PHQ completed.   GAD: 16 PHQ: 16  Patient Centered Plan: Patient is on the following Treatment Plan(s):  Anxiety and Depression   Referrals to Alternative Service(s): Referred to Alternative Service(s):   Place:   Date:   Time:    Referred to Alternative Service(s):   Place:   Date:   Time:    Referred to Alternative  Service(s):   Place:   Date:   Time:    Referred to Alternative Service(s):   Place:   Date:   Time:      Collaboration of Care: Other None  Patient/Guardian was advised Release of Information must be obtained prior to any record release in order to collaborate their care with an outside provider. Patient/Guardian was advised if they have not already done so to contact the registration department to sign all necessary forms in order for [place] to release information regarding their care.   Consent: Patient/Guardian gives verbal consent for treatment and assignment of benefits for services provided during this visit. Patient/Guardian expressed understanding and agreed to proceed.   Dorris Singh, [facility]

## 2022-10-04 ENCOUNTER — Telehealth: Payer: Self-pay | Admitting: Licensed Clinical Social Worker

## 2022-10-04 NOTE — Telephone Encounter (Signed)
H&V Care Navigation CSW Progress Note  Clinical Social Worker contacted patient by phone to f/u on assistance applications. Was able to reach her with assistance of Debarah Crape, [place] language interpreter [phone]. Confirmed she received the applications, she will review and I encouraged her to f/u with me as she completes them with any additional questions/concerns.   Today, pt has questions regarding monitor, she never received it. Inquired if she had any calls/messages from [employer] as the manufacturer has to be able to reach her to send monitor and I had had a hard time reaching her my first call. Provided her the number for the company as she hadnt received it from what she knows, and will f/u with RN to ensure order was placed. No additional questions at this time.   Patient is participating in a Managed Medicaid Plan:  No, self pay only.   SDOH Screenings   Food Insecurity: No Food Insecurity (09/28/2022)  Housing: Low Risk  (09/28/2022)  Transportation Needs: Unmet Transportation Needs (09/28/2022)  Utilities: Not At Risk (09/28/2022)  Alcohol Screen: Low Risk  (09/28/2022)  Depression (PHQ2-9): High Risk (09/28/2022)  Financial Resource Strain: Medium Risk (09/28/2022)  Tobacco Use: Low Risk  (09/24/2022)   [name], MSW, LCSW Clinical Social Worker II [facility] Heart/Vascular Care Navigation  [phone]- work cell phone (preferred) [phone]- desk phone

## 2022-10-05 ENCOUNTER — Other Ambulatory Visit: Payer: Self-pay | Admitting: Cardiology

## 2022-10-05 DIAGNOSIS — R002 Palpitations: Secondary | ICD-10-CM

## 2022-10-05 DIAGNOSIS — R55 Syncope and collapse: Secondary | ICD-10-CM

## 2022-10-05 NOTE — Addendum Note (Signed)
Addended by: Bernita Buffy on: 10/05/2022 08:12 AM   Modules accepted: Orders

## 2022-10-05 NOTE — Telephone Encounter (Signed)
H&V Care Navigation CSW Progress Note  Clinical Social Worker contacted patient by phone to f/u on monitor. This has been ordered today. Was able to reach her with assistance of Spanish language interpreter Sol, [phone]. I shared that the monitor was ordered and will be shipped to her in 3-5 days. Confirmed she has [employer] number and our cardiology office number if any issue. I also shared with her that all instructions are available in MyChart message sent today as well. No additional questions. Will f/u on assistance applications next week.   Patient is participating in a Managed Medicaid Plan:  No, self pay only  SDOH Screenings   Food Insecurity: No Food Insecurity (09/28/2022)  Housing: Low Risk  (09/28/2022)  Transportation Needs: Unmet Transportation Needs (09/28/2022)  Utilities: Not At Risk (09/28/2022)  Alcohol Screen: Low Risk  (09/28/2022)  Depression (PHQ2-9): High Risk (09/28/2022)  Financial Resource Strain: Medium Risk (09/28/2022)  Tobacco Use: Low Risk  (09/24/2022)    [name], MSW, LCSW Clinical Social Worker II [facility] Heart/Vascular Care Navigation  [phone]- work cell phone (preferred) [phone]- desk phone

## 2022-10-08 ENCOUNTER — Encounter (HOSPITAL_COMMUNITY): Payer: Self-pay | Admitting: Student

## 2022-10-08 ENCOUNTER — Ambulatory Visit (HOSPITAL_COMMUNITY): Payer: No Payment, Other | Admitting: Student

## 2022-10-08 DIAGNOSIS — F410 Panic disorder [episodic paroxysmal anxiety] without agoraphobia: Secondary | ICD-10-CM

## 2022-10-08 DIAGNOSIS — F411 Generalized anxiety disorder: Secondary | ICD-10-CM | POA: Diagnosis not present

## 2022-10-08 MED ORDER — SERTRALINE HCL 25 MG PO TABS
25.0000 mg | ORAL_TABLET | Freq: Every day | ORAL | 1 refills | Status: DC
Start: 2022-10-08 — End: 2022-11-19

## 2022-10-08 MED ORDER — ONE-A-DAY WOMENS PO TABS
1.0000 | ORAL_TABLET | Freq: Every day | ORAL | 0 refills | Status: DC
Start: 2022-10-08 — End: 2022-11-19

## 2022-10-08 MED ORDER — TRAZODONE HCL 50 MG PO TABS
50.0000 mg | ORAL_TABLET | Freq: Every day | ORAL | 1 refills | Status: DC
Start: 2022-10-08 — End: 2022-10-08

## 2022-10-08 MED ORDER — GABAPENTIN 100 MG PO CAPS
100.0000 mg | ORAL_CAPSULE | Freq: Two times a day (BID) | ORAL | 1 refills | Status: DC | PRN
Start: 2022-10-08 — End: 2022-12-31

## 2022-10-08 MED ORDER — TRAZODONE HCL 50 MG PO TABS
75.0000 mg | ORAL_TABLET | Freq: Every day | ORAL | 1 refills | Status: DC
Start: 2022-10-08 — End: 2022-12-31

## 2022-10-08 NOTE — Progress Notes (Signed)
BH MD Outpatient Progress Note  10/08/2022 9:23 AM Nancy Hurst  MRN:  [id number]  Assessment:  Nancy Hurst presents for follow-up evaluation in-person. Today, 10/08/22, patient reports significant improvement in anxiety with zoloft. She has reported benefit from trazodone and would like to be trialed on a slightly higher dose to aid with sleep. She does use gabapentin as needed to aid with anxiety.   Identifying Information: Nancy Hurst is a 32 y.o. female with a history of GAD with panic attacks and migraines who is an established patient with [facility] for medication management.   Plan:  # Generalized Anxiety Disorder with Panic Attacks Past medication trials: lexapro (intolerable), hydroxyzine (shaking and vomiting) Status of problem: active Interventions: -- Continue sertraline 25 mg daily for anxiety -- Continue gabapentin 100 mg bid prn for anxiety -- INCREASE trazodone 75 mg at bedtime PRN for insomnia -- START multivitamin daily -- Alternative options discussed: propranolol, duloxetine -- Labs ordered: vitamin D, B12, magnesium, TSH, lipid panel, a1c -- Referred to [facility] [place] for psychotherapy  # Headaches -Referral to Dr. Ardyth Harps to establish care  Return to care in 4 weeks  Patient was given contact information for behavioral health clinic and was instructed to call 911 for emergencies.    Patient and plan of care will be discussed with the Attending MD, Dr. Jerold Coombe, who agrees with the above statement and plan.   Subjective:  Chief Complaint: Medication Management   Interval History:  Patient presents with in person spanish interpreter. She denies SI/HI/AVH. She reports significant improvement in mood and anxiety related symptoms.  She reports that she has been compliant with her daily Zoloft as well as uses as needed gabapentin.  She reports that her sleep is  significant improved with the trazodone and would like to increase the dose a little bit to maximize her sleep.  She does feel more well rested now that she is sleeping better.  Her appetite has significantly improved as well.  She feels that she is ready to go back to work on 10/18/2022 for which she requested a letter.  She had questions regarding taking medications when she becomes pregnant and I discussed that these medications are not contraindicated during pregnancy.  I did encourage her to start taking a multivitamin but she states she was unable to receive it because it was unavailable to pick up at the pharmacy.  I discussed that I would resend the medication as well as refill the remainder of her medications.  She requested follow-up in 1 month to ensure that she continues to do well.  She continues to complain of bitemporal headache that persist throughout the day.  She states that it occurs 5-6 times per month and takes ibuprofen 200 mg on those days.  I discussed that she can take it every 6-8 hours as needed during those days to see if that would better improve her headache.  I stated I would defer headache management to PCP.  She verbalized understanding.  All questions were addressed.  She had an inquiry regarding lab work results and I stated that I did not receive them as of yet but would reach out to her once they have resulted.   Visit Diagnosis:    ICD-10-CM   1. Panic attacks  F41.0     2. Generalized anxiety disorder  F41.1 sertraline (ZOLOFT) 25 MG tablet    Multiple Vitamins-Minerals (ONE-A-DAY WOMENS) tablet    gabapentin (NEURONTIN) 100  MG capsule    traZODone (DESYREL) 50 MG tablet    DISCONTINUED: traZODone (DESYREL) 50 MG tablet       Past Psychiatric History:  Diagnoses: GAD with panic Disorder Medication trials: none Previous psychiatrist/therapist: denies Hospitalizations: 1 (same day discharge) Suicide attempts: denies SIB: denies Hx of violence towards  others: denies Current access to guns: denies Hx of trauma/abuse: denies  Past Medical History:  Past Medical History:  Diagnosis Date   Anxiety    Infertility, female    Tuberculosis     Past Surgical History:  Procedure Laterality Date   BREAST SURGERY     left breast   IR PARACENTESIS  01/26/2019   IR THORACENTESIS ASP PLEURAL SPACE W/IMG GUIDE  01/25/2019   LAPAROSCOPIC APPENDECTOMY N/A 11/03/2017   Procedure: APPENDECTOMY LAPAROSCOPIC;  Surgeon: Almond Lint, MD;  Location: [facility];  Service: General;  Laterality: N/A;    Family Psychiatric History: denies  Family History:  Family History  Problem Relation Age of Onset   Hypertension Mother     Social History:  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  No children.     Social Determinants of Health   Financial Resource Strain: Medium Risk (09/28/2022)   Overall Financial Resource Strain (CARDIA)    Difficulty of Paying Living Expenses: Somewhat hard  Food Insecurity: No Food Insecurity (09/28/2022)   Hunger Vital Sign    Worried About Running Out of Food in the Last Year: Never true    Ran Out of Food in the Last Year: Never true  Transportation Needs: Unmet Transportation Needs (09/28/2022)   PRAPARE - Administrator, Civil Service (Medical): Yes    Lack of Transportation (Non-Medical): Yes  Physical Activity: Not on file   Stress: Not on file  Social Connections: Not on file    Allergies:  No Known Allergies  Current Medications: Current Outpatient Medications  Medication Sig Dispense Refill   gabapentin (NEURONTIN) 100 MG capsule Take 1 capsule (100 mg total) by mouth 2 (two) times daily as needed (Anxiety). 60 capsule 1   Multiple Vitamins-Minerals (ONE-A-DAY WOMENS) tablet Take 1 tablet by mouth daily. 30 tablet 0   sertraline (ZOLOFT) 25 MG tablet Take 1 tablet (25 mg total) by mouth daily. 30 tablet 1   traZODone (DESYREL) 50 MG tablet Take 1.5 tablets (75 mg total) by mouth at bedtime. 45 tablet 1   No current facility-administered medications for this visit.    ROS: Review of Systems   Objective:  Psychiatric Specialty Exam: There were no vitals taken for this visit.There is no height or weight on file to calculate BMI.  General Appearance: Well Groomed  Eye Contact: Good  Speech:  Clear and Coherent  Volume:  Normal  Mood:  Euthymic  Affect:  Appropriate and  Congruent  Thought Content: Logical   Suicidal Thoughts:  No  Homicidal Thoughts:  No  Thought Process:  Coherent, Goal Directed, and Linear  Orientation:  Full (Time, Place, and Person)    Memory: Remote;   Fair  Judgment:  Fair  Insight:  Fair  Concentration:  Concentration: Good  Recall: not formally assessed   Fund of Knowledge: Fair  Language: Fair  Psychomotor Activity:  Normal  Akathisia:  No  AIMS (if indicated): not done  Assets:  Communication Skills Desire for Improvement Financial Resources/Insurance Housing Intimacy Leisure Time Physical Health Resilience Social Support Talents/Skills Transportation Vocational/Educational  ADL's:  Intact  Cognition: WNL  Sleep:  Fair   PE: General: well-appearing; no acute distress  Pulm: no increased work of breathing on room air  Strength & Muscle Tone: within normal limits Neuro: no focal neurological deficits observed  Gait & Station: normal  Metabolic  Disorder Labs: No results found for: "HGBA1C", "MPG" No results found for: "PROLACTIN" No results found for: "CHOL", "TRIG", "HDL", "CHOLHDL", "VLDL", "LDLCALC" Lab Results  Component Value Date   TSH 0.489 08/02/2022    Therapeutic Level Labs: No results found for: "LITHIUM" No results found for: "VALPROATE" No results found for: "CBMZ"  Screenings: GAD-7    Flowsheet Row Counselor from 09/28/2022 in [facility]  Total GAD-7 Score 16      PHQ2-9    Flowsheet Row Counselor from 09/28/2022 in [place]  PHQ-2 Total Score 4  PHQ-9 Total Score 16      Flowsheet Row ED from 09/02/2022 in [facility] ED from 09/01/2022 in [facility] ED from 08/30/2022 in [facility] Emergency Department at [facility]  C-SSRS RISK CATEGORY No Risk No Risk No Risk       Collaboration of Care: Collaboration of Care:  Patient/Guardian was advised Release of Information must be obtained prior to any record release in order to collaborate their care with an outside provider. Patient/Guardian was advised if they have not already done so to contact the registration department to sign all necessary forms in order for [place] to release information regarding their care.   Consent: Patient/Guardian gives verbal consent for treatment and assignment of benefits for services provided during this visit. Patient/Guardian expressed understanding and agreed to proceed.   Park Pope, MD 10/08/2022, 9:23 AM

## 2022-10-13 ENCOUNTER — Telehealth: Payer: Self-pay | Admitting: Licensed Clinical Social Worker

## 2022-10-13 NOTE — Telephone Encounter (Signed)
H&V Care Navigation CSW Progress Note  Clinical Social Worker contacted patient by phone to f/u on assistance applications. Was able to reach her with assistance of Lamona Curl language interpreter [phone]. She shares she hasn't looked at applications still. Had questions about why she should complete them which I answered to the best of my ability.   She shared she hasn't received her monitor yet. I encouraged her to call company first and if they aren't able to connect her with an answer to call the clinical team back directly.   She is agreeable to me following up again next week to ensure that she is able to review applications.   Patient is participating in a Managed Medicaid Plan:  No, self pay only  SDOH Screenings   Food Insecurity: No Food Insecurity (09/28/2022)  Housing: Low Risk  (09/28/2022)  Transportation Needs: Unmet Transportation Needs (09/28/2022)  Utilities: Not At Risk (09/28/2022)  Alcohol Screen: Low Risk  (09/28/2022)  Depression (PHQ2-9): High Risk (09/28/2022)  Financial Resource Strain: Medium Risk (09/28/2022)  Tobacco Use: Low Risk  (10/08/2022)    [name], MSW, LCSW Clinical Social Worker II [facility] Health Heart/Vascular Care Navigation  [phone]- work cell phone (preferred) [phone]- desk phone

## 2022-10-20 ENCOUNTER — Telehealth: Payer: Self-pay | Admitting: Licensed Clinical Social Worker

## 2022-10-20 NOTE — Telephone Encounter (Signed)
H&V Care Navigation CSW Progress Note  Clinical Social Worker contacted patient by phone to f/u on assistance applications. Was able to reach her at [phone]. With assistance of Spanish language interpreter Dorene Grebe [phone] I inquired if pt had started working on applications. She has not and did not have any questions at this time. I encouraged her to start them to prevent any additional bills to accrue.   She did receive her monitor but wants to return it b/c "she feels better," and may want to cancel her appt. LCSW encouraged her to call the office to speak with nursing staff before cancelling since it is a month away. Pt agreeable, took down clinic number. No additional questions at this time. Will f/u 1x more to try and encourage pt to complete applications.  Patient is participating in a Managed Medicaid Plan:  No, self pay only  SDOH Screenings   Food Insecurity: No Food Insecurity (09/28/2022)  Housing: Low Risk  (09/28/2022)  Transportation Needs: Unmet Transportation Needs (09/28/2022)  Utilities: Not At Risk (09/28/2022)  Alcohol Screen: Low Risk  (09/28/2022)  Depression (PHQ2-9): High Risk (09/28/2022)  Financial Resource Strain: Medium Risk (09/28/2022)  Tobacco Use: Low Risk  (10/08/2022)    [name], MSW, LCSW Clinical Social Worker II [facility] Health Heart/Vascular Care Navigation  [phone]- work cell phone (preferred) [phone]- desk phone

## 2022-11-01 ENCOUNTER — Telehealth: Payer: Self-pay | Admitting: Licensed Clinical Social Worker

## 2022-11-01 NOTE — Telephone Encounter (Signed)
H&V Care Navigation CSW Progress Note  Clinical Social Worker contacted patient by phone to f/u on assistance applications and to provide reminder to pt for upcoming appt. Was able to reach her at [phone], with assistance of Spanish language interpreter Nolani [phone]. She shares she plans to cancel her upcoming appt- reminded her of number and importance of cancelling so she doesn't have a no show on her record. She has applications, has not completed/submitted them. If any additional assistance needed encouraged pt to call me. This will be my last check in as outreach has been completed for pt non 8/26, 8/27, 9/4, 9/11, and today. Remain available as needed.  Patient is participating in a Managed Medicaid Plan:  No, self pay only  SDOH Screenings   Food Insecurity: No Food Insecurity (09/28/2022)  Housing: Low Risk  (09/28/2022)  Transportation Needs: Unmet Transportation Needs (09/28/2022)  Utilities: Not At Risk (09/28/2022)  Alcohol Screen: Low Risk  (09/28/2022)  Depression (PHQ2-9): High Risk (09/28/2022)  Financial Resource Strain: Medium Risk (09/28/2022)  Tobacco Use: Low Risk  (10/08/2022)   [name], MSW, LCSW Clinical Social Worker II [facility] Health Heart/Vascular Care Navigation  [phone]- work cell phone (preferred) [phone]- desk phone

## 2022-11-09 ENCOUNTER — Ambulatory Visit: Payer: Self-pay | Admitting: Adult Health

## 2022-11-16 ENCOUNTER — Ambulatory Visit (INDEPENDENT_AMBULATORY_CARE_PROVIDER_SITE_OTHER): Payer: Self-pay | Admitting: Internal Medicine

## 2022-11-16 ENCOUNTER — Encounter: Payer: Self-pay | Admitting: Internal Medicine

## 2022-11-16 VITALS — BP 98/68 | HR 71 | Temp 98.2°F | Ht 61.61 in | Wt 104.0 lb

## 2022-11-16 DIAGNOSIS — F419 Anxiety disorder, unspecified: Secondary | ICD-10-CM

## 2022-11-16 DIAGNOSIS — N926 Irregular menstruation, unspecified: Secondary | ICD-10-CM

## 2022-11-16 LAB — POCT URINE PREGNANCY: Preg Test, Ur: NEGATIVE

## 2022-11-16 NOTE — Progress Notes (Signed)
New Patient Office Visit     CC/Reason for Visit: Establish care, discuss chronic and acute concerns Previous PCP: None Last Visit: Unknown  HPI: Nancy Hurst is a 32 y.o. female who is coming in today for the above mentioned reasons. Past Medical History is significant for: Generalized anxiety disorder with panic attacks followed by psychiatry, currently on sertraline, trazodone for sleep and gabapentin.  She feels like it is improved.  She declines flu, COVID, Tdap vaccines today despite counseling.  She has been having some irregular menstrual bleeding.  She had her last period on September 22 and has been dealing with some bleeding for the past 3 days.  She does not have a gynecologist.  She is Spanish-speaking, speaks very little [place].  Does not smoke or drink, no known drug allergies, no family history of significance, past surgical history is significant for an appendectomy at age 8.   Past Medical/Surgical History: Past Medical History:  Diagnosis Date   Anxiety    Infertility, female    Tuberculosis     Past Surgical History:  Procedure Laterality Date   BREAST SURGERY     left breast   IR PARACENTESIS  01/26/2019   IR THORACENTESIS ASP PLEURAL SPACE W/IMG GUIDE  01/25/2019   LAPAROSCOPIC APPENDECTOMY N/A 11/03/2017   Procedure: APPENDECTOMY LAPAROSCOPIC;  Surgeon: Almond Lint, MD;  Location: [facility];  Service: General;  Laterality: N/A;    Social History:  reports that she has never smoked. She has never used smokeless tobacco. She reports that she does not drink alcohol and does not use drugs.  Allergies: Allergies  Allergen Reactions   Hydroxyzine Anxiety, Other (See Comments) and Rash    Worsened patient's anxiety she said (25 mg tablet form)    Family History:  Family History  Problem Relation Age of Onset   Hypertension Mother      Current Outpatient Medications:    cyclobenzaprine (FLEXERIL) 5 MG tablet, Take 5 mg by mouth at  bedtime as needed., Disp: , Rfl:    gabapentin (NEURONTIN) 100 MG capsule, Take 1 capsule (100 mg total) by mouth 2 (two) times daily as needed (Anxiety)., Disp: 60 capsule, Rfl: 1   omeprazole (PRILOSEC) 20 MG capsule, Take 20 mg by mouth daily., Disp: , Rfl:    sertraline (ZOLOFT) 25 MG tablet, Take 1 tablet (25 mg total) by mouth daily., Disp: 30 tablet, Rfl: 1   traZODone (DESYREL) 50 MG tablet, Take 1.5 tablets (75 mg total) by mouth at bedtime., Disp: 45 tablet, Rfl: 1   Multiple Vitamins-Minerals (ONE-A-DAY WOMENS) tablet, Take 1 tablet by mouth daily., Disp: 30 tablet, Rfl: 0  Review of Systems:  Negative except as indicated in HPI.   Physical Exam: Vitals:   11/16/22 1451  BP: 98/68  Pulse: 71  Temp: 98.2 F (36.8 C)  TempSrc: Oral  SpO2: 99%  Weight: 103 lb 15.7 oz (47.2 kg)  Height: 5' 1.61" (1.565 m)   Body mass index is 19.26 kg/m.  Physical Exam Vitals reviewed.  Constitutional:      Appearance: Normal appearance.  HENT:     Head: Normocephalic and atraumatic.  Eyes:     Conjunctiva/sclera: Conjunctivae normal.     Pupils: Pupils are equal, round, and reactive to light.  Cardiovascular:     Rate and Rhythm: Normal rate and regular rhythm.  Pulmonary:     Effort: Pulmonary effort is normal.     Breath sounds: Normal breath sounds.  Skin:  General: Skin is warm and dry.  Neurological:     General: No focal deficit present.     Mental Status: She is alert and oriented to person, place, and time.  Psychiatric:        Mood and Affect: Mood normal.        Behavior: Behavior normal.        Thought Content: Thought content normal.        Judgment: Judgment normal.      Impression and Plan:  Anxiety  Irregular menstrual bleeding -     Ambulatory referral to Gynecology -     POCT urine pregnancy    -Anxiety appears to be relatively well-controlled.  Continue psychiatric follow-up. -Urine pregnancy test is negative, GYN referral  placed.    Chaya Jan, MD [facility]

## 2022-11-18 ENCOUNTER — Ambulatory Visit (INDEPENDENT_AMBULATORY_CARE_PROVIDER_SITE_OTHER): Payer: No Typology Code available for payment source | Admitting: Mental Health

## 2022-11-18 ENCOUNTER — Encounter (HOSPITAL_COMMUNITY): Payer: Self-pay

## 2022-11-18 DIAGNOSIS — F410 Panic disorder [episodic paroxysmal anxiety] without agoraphobia: Secondary | ICD-10-CM

## 2022-11-18 DIAGNOSIS — F411 Generalized anxiety disorder: Secondary | ICD-10-CM | POA: Diagnosis not present

## 2022-11-18 DIAGNOSIS — F321 Major depressive disorder, single episode, moderate: Secondary | ICD-10-CM

## 2022-11-18 NOTE — Progress Notes (Unsigned)
   THERAPIST PROGRESS NOTE  Session Time: 11:10 am ( 50 minutes)  Participation Level: Active  Behavioral Response: CasualAlertAnxious and Depressed  Type of Therapy: Individual Therapy  Treatment Goals addressed: "The Fear." Uri will increase management of anxiety AEB development of x 3 effective coping skills for anxiety with ability to reframe maladaptive thinking patterns daily as needed within the next 90 days.   ProgressTowards Goals: Initial  Interventions: Supportive  Summary: Lita Flynn is a 32 y.o. female who presents with dx of Generalize anxiety, panic and major depression moderate. Kayse presents with [place] interpreter Scarlette Calico. Tanina shares for there to have improvements with sxs of anxiety secondary to medications however feels as if she is still having episodes of fear. Notes for her sleep and appetite to have improved and tries to go on walks to support with coping. Shares difficulty being alone with fear something bad is going to happen in which boyfriend's mother will have to come over to the house when he is not home so she will not be alone.  Shares can also often think of the past in which nephew was hurt however he has improved. Shares can also think of possible future events and things that will go wrong. Engaged with therapist in education on anxiety and explores coping skills of walking and music. Notes anxiety can increase at night with feeling as if she hears someone walking in her home. Reports to have returned back to work and for that to be going well; spends time with boyfriends family weekly; speaks to her family on the phone. Agrees to treatment plan. Denies SI/HI. Initial treatment plan developed.   Suicidal/Homicidal: Nowithout intent/plan  Therapist Response: Therapist engaged Jashiya in therapy session. Completed check in and assessed for current level of functioning, sxs management and level of stressors. Reviewed intake assessment;  reviewed informed consent and bounds of confidentiality. Provided safe space to share thoughts and feelings in regards to current stressors and sxs of anxiety. Explored current home life, relationships and work. Explored previously developed coping skills. Provided psycho-education on anxiety. Explored anxiety and working to increase ability to be present focused. Engaged in treatment planning. Reviewed session and provided follow up.   Plan: Return again in  x 5 weeks.  Diagnosis: Generalized anxiety disorder  Panic attacks  Moderate major depression, single episode (HCC)  Collaboration of Care: Other None  Patient/Guardian was advised Release of Information must be obtained prior to any record release in order to collaborate their care with an outside provider. Patient/Guardian was advised if they have not already done so to contact the registration department to sign all necessary forms in order for [place] to release information regarding their care.   Consent: Patient/Guardian gives verbal consent for treatment and assignment of benefits for services provided during this visit. Patient/Guardian expressed understanding and agreed to proceed.   Stephan Minister Berger, [facility] 11/21/2022

## 2022-11-19 ENCOUNTER — Ambulatory Visit (HOSPITAL_COMMUNITY): Payer: No Typology Code available for payment source | Admitting: Student

## 2022-11-19 ENCOUNTER — Ambulatory Visit (HOSPITAL_COMMUNITY): Payer: No Payment, Other | Admitting: Mental Health

## 2022-11-19 DIAGNOSIS — F410 Panic disorder [episodic paroxysmal anxiety] without agoraphobia: Secondary | ICD-10-CM

## 2022-11-19 DIAGNOSIS — F411 Generalized anxiety disorder: Secondary | ICD-10-CM | POA: Diagnosis not present

## 2022-11-19 MED ORDER — ONE-A-DAY WOMENS PO TABS
1.0000 | ORAL_TABLET | Freq: Every day | ORAL | 1 refills | Status: AC
Start: 2022-11-19 — End: 2023-06-07

## 2022-11-19 MED ORDER — SERTRALINE HCL 50 MG PO TABS
50.0000 mg | ORAL_TABLET | Freq: Every day | ORAL | 0 refills | Status: DC
Start: 2022-11-19 — End: 2022-12-31

## 2022-11-19 NOTE — Progress Notes (Signed)
BH MD Outpatient Progress Note  11/19/2022 3:18 PM Nancy Hurst  MRN:  [id number]  Assessment:  Nancy Hurst presents for follow-up evaluation in-person. Today, 11/19/22, patient reports continued improvement in anxiety with zoloft. She has some residual anxiety so plan to increase her sertraline for this reason. Trazodone has provided appropriate sleep at 50 mg. She does use gabapentin as needed to aid with anxiety approximately once a day.  Identifying Information: Nancy Hurst is a 32 y.o. female with a history of GAD with panic attacks and migraines who is an established patient with [facility] for medication management.   Plan:  # Generalized Anxiety Disorder with Panic Attacks Past medication trials: lexapro (intolerable), hydroxyzine (shaking and vomiting) Status of problem: active Interventions: -- INCREASE sertraline to 50 mg daily for anxiety -- Continue gabapentin 100 mg bid prn for anxiety -- Decrease trazodone to 50 mg at bedtime PRN for insomnia -- START multivitamin daily -- Alternative options discussed: propranolol, duloxetine -- Labs reordered: vitamin D, B12, magnesium, TSH, lipid panel, a1c -- Referred to [facility] [place] for psychotherapy   Return to care in 4 weeks  Patient was given contact information for behavioral health clinic and was instructed to call 911 for emergencies.    Patient and plan of care will be discussed with the Attending MD, Dr. Viviano Simas, who agrees with the above statement and plan.   Subjective:  Chief Complaint: Medication Management   Interval History:  Patient presents with in person spanish interpreter. She denies SI/HI/AVH.  She reports that her mood continues to improve and her anxiety is better controlled than before.  She reports still having occasional anxiety while at work as well as at home but denies experiencing panic attacks since last  visit.  She reports that she sleeps approximately 8 hours/day when taking trazodone.  She reports that she uses gabapentin approximately once a day.  She wanted to follow-up on her lab results as it has been 2 months but it seems that the lab findings were lost so she was rescheduled for another lab appointment.  She also has been unable to get her multivitamins for some reason.  I printed out a photo of the recommended One-A-Day vitamin and gave it to her so that she would be able to find it at the pharmacy.  Visit Diagnosis:    ICD-10-CM   1. Generalized anxiety disorder  F41.1 Multiple Vitamins-Minerals (ONE-A-DAY WOMENS) tablet    sertraline (ZOLOFT) 50 MG tablet    2. Panic attacks  F41.0         Past Psychiatric History:  Diagnoses: GAD with panic Disorder Medication trials: none Previous psychiatrist/therapist: denies Hospitalizations: 1 (same day discharge) Suicide attempts: denies SIB: denies Hx of violence towards others: denies Current access to guns: denies Hx of trauma/abuse: denies  Past Medical History:  Past Medical History:  Diagnosis Date   Anxiety    Infertility, female    Tuberculosis     Past Surgical History:  Procedure Laterality Date   BREAST SURGERY     left breast   IR PARACENTESIS  01/26/2019   IR THORACENTESIS ASP PLEURAL SPACE W/IMG GUIDE  01/25/2019   LAPAROSCOPIC APPENDECTOMY N/A 11/03/2017   Procedure: APPENDECTOMY LAPAROSCOPIC;  Surgeon: Almond Lint, MD;  Location: [facility];  Service: General;  Laterality: N/A;    Family Psychiatric History: denies  Family History:  Family History  Problem Relation Age of Onset   Hypertension Mother  Social History:  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  No children.     Social Determinants of Health   Financial Resource Strain: Medium Risk (09/28/2022)   Overall Financial Resource Strain (CARDIA)    Difficulty of Paying Living Expenses: Somewhat hard  Food Insecurity: No Food Insecurity (09/28/2022)   Hunger Vital Sign    Worried About Running Out of Food in the Last Year: Never true    Ran Out of Food in the Last Year: Never true  Transportation Needs: Unmet Transportation Needs (09/28/2022)   PRAPARE - Administrator, Civil Service (Medical): Yes    Lack of Transportation (Non-Medical): Yes  Physical Activity: Not on file  Stress: Not on file  Social Connections: Not on file    Allergies:  Allergies  Allergen Reactions   Hydroxyzine Anxiety, Other (See Comments) and Rash    Worsened patient's anxiety she said (25 mg tablet form)    Current Medications: Current Outpatient Medications  Medication Sig Dispense Refill   cyclobenzaprine (FLEXERIL) 5 MG tablet Take 5 mg by mouth at bedtime as needed.     gabapentin (NEURONTIN) 100 MG capsule Take 1 capsule (100 mg total) by mouth 2 (two) times daily as needed (Anxiety). 60 capsule 1   Multiple Vitamins-Minerals (ONE-A-DAY WOMENS) tablet Take 1 tablet by mouth daily. 100 tablet 1   omeprazole (PRILOSEC) 20 MG capsule Take 20 mg by mouth daily.     sertraline (ZOLOFT) 50 MG tablet Take 1 tablet (50 mg total) by mouth daily. 60 tablet 0   traZODone (DESYREL) 50 MG tablet Take 1.5 tablets (75 mg total) by mouth at bedtime. 45 tablet 1   No current facility-administered medications for this visit.    ROS: Review of Systems    Objective:  Psychiatric Specialty Exam: Last menstrual period 10/31/2022.There is no height or weight on file to calculate BMI.  General Appearance: Well Groomed  Eye Contact: Good  Speech:  Clear and Coherent  Volume:  Normal  Mood:  Euthymic  Affect:  Appropriate and Congruent  Thought Content: Logical   Suicidal Thoughts:  No  Homicidal Thoughts:  No  Thought Process:  Coherent, Goal Directed, and Linear  Orientation:  Full (Time, Place, and Person)    Memory: Remote;   Fair  Judgment:  Fair  Insight:  Fair  Concentration:  Concentration: Good  Recall: not formally assessed   Fund of Knowledge: Fair  Language: Fair  Psychomotor Activity:  Normal  Akathisia:  No  AIMS (if indicated): not done  Assets:  Communication Skills Desire for Improvement Financial Resources/Insurance Housing Intimacy Leisure Time Physical Health Resilience Social Support Talents/Skills Transportation Vocational/Educational  ADL's:  Intact  Cognition: WNL  Sleep:  Fair   PE: General: well-appearing; no acute distress  Pulm: no increased work of breathing on room air  Strength & Muscle Tone: within normal limits Neuro: no focal neurological deficits observed  Gait & Station: normal  Metabolic Disorder Labs: No results found for: "HGBA1C", "MPG" No results found for: "PROLACTIN" No results found for: "CHOL", "TRIG", "HDL", "CHOLHDL", "VLDL", "LDLCALC" Lab Results  Component Value Date   TSH 0.489 08/02/2022    Therapeutic Level Labs: No results found for: "LITHIUM" No results found for: "VALPROATE" No results found for: "CBMZ"  Screenings: GAD-7    Flowsheet Row Counselor from [facility]/28/2022 in [facility]  Total GAD-7 Score 16      PHQ2-9    Flowsheet Row Counselor from 09/28/2022 in [place]  PHQ-2 Total Score 4  PHQ-9 Total Score 16      Flowsheet Row ED from 09/02/2022 in [facility] ED from 09/01/2022 in [facility] ED from 08/30/2022 in [facility] Emergency Department at [facility]  C-SSRS RISK CATEGORY No Risk No Risk No Risk       Collaboration of Care: Collaboration of Care:  Patient/Guardian was advised Release of Information must be obtained prior to any record release in order to collaborate their care with an outside provider. Patient/Guardian was advised if they have not already done so to contact the registration department to sign all necessary forms in order for [place] to release information regarding their care.   Consent: Patient/Guardian gives verbal consent for treatment and assignment of benefits for services provided during this visit. Patient/Guardian expressed understanding and agreed to proceed.   Park Pope, MD 11/19/2022, 3:18 PM

## 2022-12-06 ENCOUNTER — Other Ambulatory Visit (HOSPITAL_COMMUNITY): Payer: No Payment, Other

## 2022-12-30 NOTE — Progress Notes (Signed)
BH MD Outpatient Progress Note  12/31/2022 12:48 PM Nancy Hurst  MRN:  [id number]  Assessment:  Nancy Hurst presents for follow-up evaluation in-person. Today, 12/31/22, patient reports continued stability since last visit.  No psychotropic adjustments were made this visit.  She remains amenable to continuing medications as prescribed.  Identifying Information: Nancy Hurst is a 32 y.o. female with a history of GAD with panic attacks and migraines who is an established patient with [facility] for medication management.   Plan:  # Generalized Anxiety Disorder with Panic Attacks Past medication trials: lexapro (intolerable), hydroxyzine (shaking and vomiting) Status of problem: remission Interventions: -- Continue sertraline to 50 mg daily for anxiety -- Continue gabapentin 100 mg bid prn for anxiety -- Continue trazodone to 50 mg at bedtime PRN for insomnia -- Continue multivitamin daily -- Alternative options discussed: propranolol, duloxetine -- Labs reordered: vitamin D, B12, magnesium, TSH, lipid panel, a1c -- Referred to [facility] [place] for psychotherapy   Return to care in 4 weeks  Patient was given contact information for behavioral health clinic and was instructed to call 911 for emergencies.    Patient and plan of care will be discussed with the Attending MD, Dr. Viviano Simas, who agrees with the above statement and plan.   Subjective:  Chief Complaint: Medication Management   Interval History:  Patient presents with in person spanish interpreter. She denies SI/HI/AVH.  She reports that her mood has been stable and her anxiety is better controlled than before.  She denies any panic attacks  She reports that she sleeps approximately 7 hours/day when taking trazodone.  She reports that she uses gabapentin approximately once a day. She missed her last lab appointment due to work being overly  busy. She has been taking multivitamin as well.  Visit Diagnosis:    ICD-10-CM   1. Generalized anxiety disorder  F41.1 traZODone (DESYREL) 50 MG tablet    sertraline (ZOLOFT) 50 MG tablet    gabapentin (NEURONTIN) 100 MG capsule         Past Psychiatric History:  Diagnoses: GAD with panic Disorder Medication trials: none Previous psychiatrist/therapist: denies Hospitalizations: 1 (same day discharge) Suicide attempts: denies SIB: denies Hx of violence towards others: denies Current access to guns: denies Hx of trauma/abuse: denies  Past Medical History:  Past Medical History:  Diagnosis Date   Anxiety    Infertility, female    Tuberculosis     Past Surgical History:  Procedure Laterality Date   BREAST SURGERY     left breast   IR PARACENTESIS  01/26/2019   IR THORACENTESIS ASP PLEURAL SPACE W/IMG GUIDE  01/25/2019   LAPAROSCOPIC APPENDECTOMY N/A 11/03/2017   Procedure: APPENDECTOMY LAPAROSCOPIC;  Surgeon: Almond Lint, MD;  Location: [facility];  Service: General;  Laterality: N/A;    Family Psychiatric History: denies  Family History:  Family History  Problem Relation Age of Onset   Hypertension Mother     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  No children.     Social Determinants of Health   Financial Resource Strain: Medium Risk (09/28/2022)   Overall  Financial Resource Strain (CARDIA)    Difficulty of Paying Living Expenses: Somewhat hard  Food Insecurity: No Food Insecurity (09/28/2022)   Hunger Vital Sign    Worried About Running Out of Food in the Last Year: Never true    Ran Out of Food in the Last Year: Never true  Transportation Needs: Unmet Transportation Needs (09/28/2022)   PRAPARE - Administrator, Civil Service (Medical): Yes    Lack of Transportation (Non-Medical): Yes  Physical Activity: Not on file  Stress: Not on file  Social Connections: Not on file    Allergies:  Allergies  Allergen Reactions   Hydroxyzine Anxiety, Other (See Comments) and Rash    Worsened patient's anxiety she said (25 mg tablet form)    Current Medications: Current Outpatient Medications  Medication Sig Dispense Refill   cyclobenzaprine (FLEXERIL) 5 MG tablet Take 5 mg by mouth at bedtime as needed.     gabapentin (NEURONTIN) 100 MG capsule Take 1 capsule (100 mg total) by mouth 2 (two) times daily as needed (Anxiety). 120 capsule 1   Multiple Vitamins-Minerals (ONE-A-DAY WOMENS) tablet Take 1 tablet by mouth daily. 100 tablet 1   omeprazole (PRILOSEC) 20 MG capsule Take 20 mg by mouth daily.     sertraline (ZOLOFT) 50 MG tablet Take 1 tablet (50 mg total) by mouth daily. 90 tablet 0   traZODone (DESYREL) 50 MG tablet Take 1.5 tablets (75 mg total) by mouth at bedtime. 135 tablet 0   No current facility-administered medications for this visit.    ROS: Review of Systems   Objective:  Psychiatric Specialty Exam: There were no vitals taken for this visit.There is no height or weight on file to calculate BMI.  General Appearance: Well Groomed  Eye Contact: Good  Speech:  Clear and Coherent  Volume:  Normal  Mood:  Euthymic  Affect:  Appropriate and Congruent  Thought Content: Logical   Suicidal Thoughts:  No  Homicidal Thoughts:  No  Thought Process:  Coherent, Goal Directed, and Linear  Orientation:  Full (Time, Place,  and Person)    Memory: Remote;   Fair  Judgment:  Fair  Insight:  Fair  Concentration:  Concentration: Good  Recall: not formally assessed   Fund of Knowledge: Fair  Language: Fair  Psychomotor Activity:  Normal  Akathisia:  No  AIMS (if indicated): not done  Assets:  Communication Skills Desire for Improvement Financial Resources/Insurance Housing Intimacy Leisure Time Physical Health Resilience Social Support Talents/Skills Transportation Vocational/Educational  ADL's:  Intact  Cognition: WNL  Sleep:  Fair   PE: General: well-appearing; no acute distress  Pulm: no increased work of breathing on room air  Strength & Muscle Tone: within normal limits Neuro: no focal neurological deficits observed  Gait & Station: normal  Metabolic Disorder Labs: No results found for: "HGBA1C", "MPG" No results found for: "PROLACTIN" No results found for: "CHOL", "TRIG", "HDL", "CHOLHDL", "VLDL", "LDLCALC" Lab Results  Component Value Date   TSH 0.489 08/02/2022    Therapeutic Level Labs: No results found for: "LITHIUM" No results found for: "VALPROATE" No results found for: "CBMZ"  Screenings: GAD-7  Flowsheet Row Counselor from [facility]/28/20[facility] in [facility]  Total GAD-7 Score 16      PHQ2-9    Flowsheet Row Counselor from 09/28/2022 in [facility]  PHQ-2 Total Score 4  PHQ-9 Total Score 16      Flowsheet Row ED from 09/02/2022 in [facility] ED from 09/01/2022 in [facility] ED from 08/30/2022 in [facility] Emergency Department at [facility]  C-SSRS RISK CATEGORY No Risk No Risk No Risk       Collaboration of Care: Collaboration of Care:  Patient/Guardian was advised Release of Information must be obtained prior to any record release in order to collaborate their care with an outside provider. Patient/Guardian was advised if they have not  already done so to contact the registration department to sign all necessary forms in order for [place] to release information regarding their care.   Consent: Patient/Guardian gives verbal consent for treatment and assignment of benefits for services provided during this visit. Patient/Guardian expressed understanding and agreed to proceed.   Park Pope, MD 12/31/2022, 12:48 PM

## 2022-12-31 ENCOUNTER — Ambulatory Visit (INDEPENDENT_AMBULATORY_CARE_PROVIDER_SITE_OTHER): Payer: No Payment, Other | Admitting: Student

## 2022-12-31 DIAGNOSIS — F411 Generalized anxiety disorder: Secondary | ICD-10-CM | POA: Diagnosis not present

## 2022-12-31 MED ORDER — SERTRALINE HCL 50 MG PO TABS
50.0000 mg | ORAL_TABLET | Freq: Every day | ORAL | 0 refills | Status: DC
Start: 2022-12-31 — End: 2023-04-06

## 2022-12-31 MED ORDER — GABAPENTIN 100 MG PO CAPS
100.0000 mg | ORAL_CAPSULE | Freq: Two times a day (BID) | ORAL | 1 refills | Status: DC | PRN
Start: 2022-12-31 — End: 2023-04-06

## 2022-12-31 MED ORDER — TRAZODONE HCL 50 MG PO TABS
75.0000 mg | ORAL_TABLET | Freq: Every day | ORAL | 0 refills | Status: DC
Start: 2022-12-31 — End: 2023-04-06

## 2023-01-11 ENCOUNTER — Ambulatory Visit (INDEPENDENT_AMBULATORY_CARE_PROVIDER_SITE_OTHER): Payer: No Payment, Other | Admitting: Mental Health

## 2023-01-11 DIAGNOSIS — F321 Major depressive disorder, single episode, moderate: Secondary | ICD-10-CM

## 2023-01-11 DIAGNOSIS — F411 Generalized anxiety disorder: Secondary | ICD-10-CM | POA: Diagnosis not present

## 2023-01-11 NOTE — Progress Notes (Signed)
   THERAPIST PROGRESS NOTE  Session Time: 8:03 am ( 27 minutes)  Participation Level: Active  Behavioral Response: CasualAlertEuthymic  Type of Therapy: Individual Therapy  Treatment Goals addressed:  "The Fear." Ame will increase management of anxiety AEB development of x 3 effective coping skills for anxiety with ability to reframe maladaptive thinking patterns daily as needed within the next 90 days.    ProgressTowards Goals: Progressing  Interventions: CBT and Supportive  Summary: Nancy Hurst is a 32 y.o. female who presents with dx of Generalized anxiety, panic and major depression moderate. Nancy Hurst presents with [place] interpreter Nancy Hurst. Nancy Hurst shares for there to have improvements with sxs of anxiety secondary to medications and engagement with therapy services. Shares to have started to work and doing well. Shares to have little time alone due to working and living with boyfriend but shares she is not intentionally ensuring that she is not alone. Denies concerns for depression and anxiety with GAD and PHQ scores of 0. Shares to be medication compliant at this time with no concerns for complications. Sleep and appetite normal. Reviews coping skills with therapist and denies safety concerns.   Suicidal/Homicidal: Nowithout intent/plan  Therapist Response: Therapist engaged Nancy Hurst in therapy session. Completed check in and assessed for current level of functioning, sxs management and level of stressors. Provided safe space to share thoughts and feelings in regards to current sxs and level of functioning. Explored medication compliance and use of coping skills. Reviewed enjoyable activities and engagement with natural supports. Completed PHQ and GAD. No concerns reported. Reviewed session and provided follow up. If no concerns at next session with cease therapy sessions with medication management services only.   Plan: Return again in x 8 weeks.  Diagnosis: Generalized  anxiety disorder  Moderate major depression, single episode (HCC)  Collaboration of Care: Other None  Patient/Guardian was advised Release of Information must be obtained prior to any record release in order to collaborate their care with an outside provider. Patient/Guardian was advised if they have not already done so to contact the registration department to sign all necessary forms in order for [place] to release information regarding their care.   Consent: Patient/Guardian gives verbal consent for treatment and assignment of benefits for services provided during this visit. Patient/Guardian expressed understanding and agreed to proceed.   Stephan Minister [place], [facility] 01/11/2023

## 2023-02-21 ENCOUNTER — Telehealth (HOSPITAL_COMMUNITY): Payer: Self-pay

## 2023-02-21 NOTE — Telephone Encounter (Signed)
 Tried calling PT this morning with interpreter - PT answered the phone and then hung up on interpreter - Interpreter tried calling PT back 2xs with calls going straight to VM - this afternoon a woman  called stating  I am calling for my employee she needs to cancel tomorrows appointment due to her having work - I asked who I was speaking with and was told Her Employer, I asked to speak with the PT and the Employer stated no I am calling for her she speaks spanish - I informed the employer that I am sorry but I am unable to schedule anything without speaking with the PT . I asked the employer to have the PT call us  back when she has time .

## 2023-02-22 ENCOUNTER — Encounter (HOSPITAL_COMMUNITY): Payer: No Payment, Other | Admitting: Student

## 2023-04-03 NOTE — Progress Notes (Unsigned)
 BH MD Outpatient Progress Note  04/03/2023 10:44 AM Nancy Hurst  MRN:  [id number]  Assessment:  Nancy Hurst presents for follow-up evaluation in-person. Today, 04/03/23, patient reports continued stability since last visit.  No psychotropic adjustments were made this visit.  She remains amenable to continuing medications as prescribed.  Sleeping 8-9 hours Not having panic attacks   Identifying Information: Nancy Hurst is a 33 y.o. female with a history of GAD with panic attacks and migraines who is an established patient with [facility] for medication management.   Plan:  # Generalized Anxiety Disorder with Panic Attacks Past medication trials: lexapro (intolerable), hydroxyzine (shaking and vomiting) Status of problem: remission Interventions: -- Continue sertraline to 50 mg daily for anxiety -- Continue gabapentin 100 mg bid prn for anxiety -- Continue trazodone to 50 mg at bedtime PRN for insomnia -- Continue multivitamin daily -- Alternative options discussed: propranolol, duloxetine -- Labs reordered: vitamin D, B12, magnesium, TSH, lipid panel, a1c -- Referred to [facility] of the [place] for psychotherapy   Return to care in 4 weeks  Patient was given contact information for behavioral health clinic and was instructed to call 911 for emergencies.    Patient and plan of care will be discussed with the Attending MD, Dr. Josephina Shih, who agrees with the above statement and plan.   Subjective:  Chief Complaint: Medication Management   Interval History:  Patient presents with in person spanish interpreter. She denies SI/HI/AVH.  She reports that her mood has been stable and her anxiety is better controlled than before.  She denies any panic attacks  She reports that she sleeps approximately 7 hours/day when taking trazodone.  She reports that she uses gabapentin approximately once a day. She missed  her last lab appointment due to work being overly busy. She has been taking multivitamin as well.  Visit Diagnosis:  No diagnosis found.      Past Psychiatric History:  Diagnoses: GAD with panic Disorder Medication trials: none Previous psychiatrist/therapist: denies Hospitalizations: 1 (same day discharge) Suicide attempts: denies SIB: denies Hx of violence towards others: denies Current access to guns: denies Hx of trauma/abuse: denies  Past Medical History:  Past Medical History:  Diagnosis Date  . Anxiety   . Infertility, female   . Tuberculosis     Past Surgical History:  Procedure Laterality Date  . BREAST SURGERY     left breast  . IR PARACENTESIS  01/26/2019  . IR THORACENTESIS ASP PLEURAL SPACE W/IMG GUIDE  01/25/2019  . LAPAROSCOPIC APPENDECTOMY N/A 11/03/2017   Procedure: APPENDECTOMY LAPAROSCOPIC;  Surgeon: Almond Lint, MD;  Location: [facility];  Service: General;  Laterality: N/A;    Family Psychiatric History: denies  Family History:  Family History  Problem Relation Age of Onset  . Hypertension Mother     Social History:  Academic/Vocational: team leader of maid service Social History   Socioeconomic History  . Marital status: Significant Other    Spouse name: Not on file  . Number of children: 0  . Years of education: Not on file  . Highest education level: 9th grade  Occupational History  . Not on file  Tobacco Use  . Smoking status: Never  . Smokeless tobacco: Never  Vaping Use  . Vaping status: Never Used  Substance and Sexual Activity  . Alcohol use: No  . Drug use: No  . Sexual activity: Yes    Partners: Male    Birth control/protection: None  Other  Topics Concern  . Not on file  Social History Narrative   Lives at home with partner.  No children.     Social Drivers of Health   Financial Resource Strain: Medium Risk (09/28/2022)   Overall Financial Resource Strain (CARDIA)   . Difficulty of Paying Living Expenses: Somewhat  hard  Food Insecurity: No Food Insecurity (09/28/2022)   Hunger Vital Sign   . Worried About Programme researcher, broadcasting/film/video in the Last Year: Never true   . Ran Out of Food in the Last Year: Never true  Transportation Needs: Unmet Transportation Needs (09/28/2022)   PRAPARE - Transportation   . Lack of Transportation (Medical): Yes   . Lack of Transportation (Non-Medical): Yes  Physical Activity: Not on file  Stress: Not on file  Social Connections: Not on file    Allergies:  Allergies  Allergen Reactions  . Hydroxyzine Anxiety, Other (See Comments) and Rash    Worsened patient's anxiety she said (25 mg tablet form)    Current Medications: Current Outpatient Medications  Medication Sig Dispense Refill  . cyclobenzaprine (FLEXERIL) 5 MG tablet Take 5 mg by mouth at bedtime as needed.    . gabapentin (NEURONTIN) 100 MG capsule Take 1 capsule (100 mg total) by mouth 2 (two) times daily as needed (Anxiety). 120 capsule 1  . Multiple Vitamins-Minerals (ONE-A-DAY WOMENS) tablet Take 1 tablet by mouth daily. 100 tablet 1  . omeprazole (PRILOSEC) 20 MG capsule Take 20 mg by mouth daily.    . sertraline (ZOLOFT) 50 MG tablet Take 1 tablet (50 mg total) by mouth daily. 90 tablet 0  . traZODone (DESYREL) 50 MG tablet Take 1.5 tablets (75 mg total) by mouth at bedtime. 135 tablet 0   No current facility-administered medications for this visit.    ROS: Review of Systems   Objective:  Psychiatric Specialty Exam: There were no vitals taken for this visit.There is no height or weight on file to calculate BMI.  General Appearance: Well Groomed  Eye Contact: Good  Speech:  Clear and Coherent  Volume:  Normal  Mood:  Euthymic  Affect:  Appropriate and Congruent  Thought Content: Logical   Suicidal Thoughts:  No  Homicidal Thoughts:  No  Thought Process:  Coherent, Goal Directed, and Linear  Orientation:  Full (Time, Place, and Person)    Memory: Remote;   Fair  Judgment:  Fair  Insight:  Fair   Concentration:  Concentration: Good  Recall: not formally assessed   Fund of Knowledge: Fair  Language: Fair  Psychomotor Activity:  Normal  Akathisia:  No  AIMS (if indicated): not done  Assets:  Communication Skills Desire for Improvement Financial Resources/Insurance Housing Intimacy Leisure Time Physical Health Resilience Social Support Talents/Skills Transportation Vocational/Educational  ADL's:  Intact  Cognition: WNL  Sleep:  Fair   PE: General: well-appearing; no acute distress  Pulm: no increased work of breathing on room air  Strength & Muscle Tone: within normal limits Neuro: no focal neurological deficits observed  Gait & Station: normal  Metabolic Disorder Labs: No results found for: "HGBA1C", "MPG" No results found for: "PROLACTIN" No results found for: "CHOL", "TRIG", "HDL", "CHOLHDL", "VLDL", "LDLCALC" Lab Results  Component Value Date   TSH 0.489 08/02/2022    Therapeutic Level Labs: No results found for: "LITHIUM" No results found for: "VALPROATE" No results found for: "CBMZ"  Screenings: GAD-7    Flowsheet Row Counselor from 01/11/2023 in [facility] Counselor from 09/28/2022 in [facility]  Total GAD-7 Score 0 16      PHQ2-9    Flowsheet Row Counselor from 01/11/2023 in [facility] Counselor from 09/28/2022 in [facility]  PHQ-2 Total Score 0 4  PHQ-9 Total Score 0 16      Flowsheet Row ED from 09/02/2022 in [facility] ED from 09/01/2022 in [facility] ED from 08/30/2022 in [facility] Emergency Department at [facility]  C-SSRS RISK CATEGORY No Risk No Risk No Risk       Collaboration of Care: Collaboration of Care:  Patient/Guardian was advised Release of Information must be obtained prior to any record release in order to collaborate their care with  an outside provider. Patient/Guardian was advised if they have not already done so to contact the registration department to sign all necessary forms in order for [place] to release information regarding their care.   Consent: Patient/Guardian gives verbal consent for treatment and assignment of benefits for services provided during this visit. Patient/Guardian expressed understanding and agreed to proceed.   Park Pope, MD 04/03/2023, 10:45 AM

## 2023-04-05 ENCOUNTER — Ambulatory Visit (INDEPENDENT_AMBULATORY_CARE_PROVIDER_SITE_OTHER): Payer: No Payment, Other | Admitting: Student

## 2023-04-05 DIAGNOSIS — F411 Generalized anxiety disorder: Secondary | ICD-10-CM

## 2023-04-06 MED ORDER — SERTRALINE HCL 50 MG PO TABS: 50.0000 mg | ORAL_TABLET | Freq: Every day | ORAL | 0 refills | Status: AC

## 2023-04-06 MED ORDER — TRAZODONE HCL 50 MG PO TABS
50.0000 mg | ORAL_TABLET | Freq: Every day | ORAL | 0 refills | Status: AC
Start: 2023-04-06 — End: 2023-07-05

## 2023-04-06 MED ORDER — GABAPENTIN 100 MG PO CAPS: 100.0000 mg | ORAL_CAPSULE | Freq: Two times a day (BID) | ORAL | 1 refills | Status: AC | PRN

## 2023-04-13 ENCOUNTER — Encounter: Payer: Self-pay | Admitting: Obstetrics and Gynecology

## 2023-05-31 ENCOUNTER — Ambulatory Visit (HOSPITAL_COMMUNITY): Payer: No Payment, Other | Admitting: Mental Health

## 2023-06-09 NOTE — Progress Notes (Deleted)
 BH MD Outpatient Progress Note  06/09/2023 2:17 PM Nancy Hurst  MRN:  [id number]  Assessment:  Nancy Hurst presents for follow-up evaluation in-person. Today, 06/09/23, patient reports continued stability since last visit.  No psychotropic adjustments were made this visit.  She remains amenable to continuing medications as prescribed.  Patient reports interest in getting pregnant in the near future so they risk-benefit discussion regarding ongoing psychotropics will be had next visit.  Upon review, there appears to be no research suggesting additional harm in pregnancy with regards to the sertraline  or gabapentin . No adjustments were made this visit.  Patient will be going to [facility] to get labs.  Identifying Information: Nancy Hurst is a 33 y.o. female with a history of GAD with panic attacks and migraines who is an established patient with [facility] for medication management.   Plan:  # Generalized Anxiety Disorder with Panic Attacks Past medication trials: lexapro  (intolerable), hydroxyzine  (shaking and vomiting) Status of problem: remission Interventions: -- Continue sertraline  to 50 mg daily for anxiety -- Continue gabapentin  100 mg bid prn for anxiety -- Continue trazodone  to 50 mg at bedtime PRN for insomnia -- Continue multivitamin daily -- Alternative options discussed: propranolol, duloxetine -- Referred to [facility] of the [place] for psychotherapy   Return to care in 4 weeks  Patient was given contact information for behavioral health clinic and was instructed to call 911 for emergencies.    Patient and plan of care will be discussed with the Attending MD, Dr. Eligio Grumbling, who agrees with the above statement and plan.   Subjective:  Chief Complaint: Medication Management   Interval History:  Patient presents with in person spanish interpreter. She denies SI/HI/AVH.  She reports that her mood has  been stable and her anxiety is better controlled than before.  She denies any panic attacks  She reports that she sleeps approximately 8 hours/day and only sporadically needing trazodone .  She reports that she uses gabapentin  approximately once a day in the morning.  He reports the [facility] in order to determine labs.  Visit Diagnosis:  No diagnosis found.      Past Psychiatric History:  Diagnoses: GAD with panic Disorder Medication trials: none Previous psychiatrist/therapist: denies Hospitalizations: 1 (same day discharge) Suicide attempts: denies SIB: denies Hx of violence towards others: denies Current access to guns: denies Hx of trauma/abuse: denies  Past Medical History:  Past Medical History:  Diagnosis Date   Anxiety    Infertility, female    Tuberculosis     Past Surgical History:  Procedure Laterality Date   BREAST SURGERY     left breast   IR PARACENTESIS  01/26/2019   IR THORACENTESIS ASP PLEURAL SPACE W/IMG GUIDE  01/25/2019   LAPAROSCOPIC APPENDECTOMY N/A 11/03/2017   Procedure: APPENDECTOMY LAPAROSCOPIC;  Surgeon: Lockie Rima, MD;  Location: [facility];  Service: General;  Laterality: N/A;    Family Psychiatric History: denies  Family History:  Family History  Problem Relation Age of Onset   Hypertension Mother     Social History:  Academic/Vocational: team leader of maid service Social History   Socioeconomic History   Marital status: Significant Other    Spouse name: Not on file   Number of children: 0   Years of education: Not on file   Highest education level: 9th grade  Occupational History   Not on file  Tobacco Use   Smoking status: Never   Smokeless tobacco: Never  Vaping Use   Vaping  status: Never Used  Substance and Sexual Activity   Alcohol use: No   Drug use: No   Sexual activity: Yes    Partners: Male    Birth control/protection: None  Other Topics Concern   Not on file  Social History Narrative   Lives at home with  partner.  No children.     Social Drivers of Health   Financial Resource Strain: Medium Risk (09/28/2022)   Overall Financial Resource Strain (CARDIA)    Difficulty of Paying Living Expenses: Somewhat hard  Food Insecurity: No Food Insecurity (09/28/2022)   Hunger Vital Sign    Worried About Running Out of Food in the Last Year: Never true    Ran Out of Food in the Last Year: Never true  Transportation Needs: Unmet Transportation Needs (09/28/2022)   PRAPARE - Administrator, Civil Service (Medical): Yes    Lack of Transportation (Non-Medical): Yes  Physical Activity: Not on file  Stress: Not on file  Social Connections: Not on file    Allergies:  Allergies  Allergen Reactions   Hydroxyzine  Anxiety, Other (See Comments) and Rash    Worsened patient's anxiety she said (25 mg tablet form)    Current Medications: Current Outpatient Medications  Medication Sig Dispense Refill   cyclobenzaprine (FLEXERIL) 5 MG tablet Take 5 mg by mouth at bedtime as needed.     gabapentin  (NEURONTIN ) 100 MG capsule Take 1 capsule (100 mg total) by mouth 2 (two) times daily as needed (Anxiety). 120 capsule 1   Multiple Vitamins-Minerals (ONE-A-DAY WOMENS) tablet Take 1 tablet by mouth daily. 100 tablet 1   omeprazole  (PRILOSEC) 20 MG capsule Take 20 mg by mouth daily.     sertraline  (ZOLOFT ) 50 MG tablet Take 1 tablet (50 mg total) by mouth daily. 90 tablet 0   traZODone  (DESYREL ) 50 MG tablet Take 1 tablet (50 mg total) by mouth at bedtime. 90 tablet 0   No current facility-administered medications for this visit.    ROS: Review of Systems   Objective:  Psychiatric Specialty Exam: There were no vitals taken for this visit.There is no height or weight on file to calculate BMI.  General Appearance: Well Groomed  Eye Contact: Good  Speech:  Clear and Coherent  Volume:  Normal  Mood:  Euthymic  Affect:  Appropriate and Congruent  Thought Content: Logical   Suicidal Thoughts:  No   Homicidal Thoughts:  No  Thought Process:  Coherent, Goal Directed, and Linear  Orientation:  Full (Time, Place, and Person)    Memory: Remote;   Fair  Judgment:  Fair  Insight:  Fair  Concentration:  Concentration: Good  Recall: not formally assessed   Fund of Knowledge: Fair  Language: Fair  Psychomotor Activity:  Normal  Akathisia:  No  AIMS (if indicated): not done  Assets:  Communication Skills Desire for Improvement Financial Resources/Insurance Housing Intimacy Leisure Time Physical Health Resilience Social Support Talents/Skills Transportation Vocational/Educational  ADL's:  Intact  Cognition: WNL  Sleep:  Fair   PE: General: well-appearing; no acute distress  Pulm: no increased work of breathing on room air  Strength & Muscle Tone: within normal limits Neuro: no focal neurological deficits observed  Gait & Station: normal  Metabolic Disorder Labs: No results found for: "HGBA1C", "MPG" No results found for: "PROLACTIN" No results found for: "CHOL", "TRIG", "HDL", "CHOLHDL", "VLDL", "LDLCALC" Lab Results  Component Value Date   TSH 0.489 08/02/2022    Therapeutic Level Labs: No results found  for: "LITHIUM" No results found for: "VALPROATE" No results found for: "CBMZ"  Screenings: GAD-7    Flowsheet Row Counselor from 01/11/2023 in [facility] Counselor from 09/28/2022 in [facility]  Total GAD-7 Score 0 16      PHQ2-9    Flowsheet Row Counselor from 01/11/2023 in [facility] Counselor from 09/28/2022 in [facility]  PHQ-2 Total Score 0 4  PHQ-9 Total Score 0 16      Flowsheet Row ED from 09/02/2022 in [facility] ED from 09/01/2022 in [facility] ED from 08/30/2022 in [facility] Emergency Department at [facility]  C-SSRS RISK CATEGORY No Risk No Risk No Risk        Collaboration of Care: Collaboration of Care:  Patient/Guardian was advised Release of Information must be obtained prior to any record release in order to collaborate their care with an outside provider. Patient/Guardian was advised if they have not already done so to contact the registration department to sign all necessary forms in order for us  to release information regarding their care.   Consent: Patient/Guardian gives verbal consent for treatment and assignment of benefits for services provided during this visit. Patient/Guardian expressed understanding and agreed to proceed.   Augusta Blizzard, MD 06/09/2023, 2:17 PM

## 2023-06-10 ENCOUNTER — Encounter (HOSPITAL_COMMUNITY): Payer: No Payment, Other | Admitting: Student

## 2023-11-13 ENCOUNTER — Emergency Department (HOSPITAL_COMMUNITY)
Admission: EM | Admit: 2023-11-13 | Discharge: 2023-11-13 | Disposition: A | Discharge status: Home / Self Care | Payer: Self-pay | Attending: Emergency Medicine | Admitting: Emergency Medicine

## 2023-11-13 ENCOUNTER — Other Ambulatory Visit: Payer: Self-pay

## 2023-11-13 ENCOUNTER — Emergency Department (HOSPITAL_COMMUNITY): Payer: Self-pay

## 2023-11-13 ENCOUNTER — Encounter (HOSPITAL_COMMUNITY): Payer: Self-pay | Admitting: *Deleted

## 2023-11-13 DIAGNOSIS — N9489 Other specified conditions associated with female genital organs and menstrual cycle: Secondary | ICD-10-CM

## 2023-11-13 DIAGNOSIS — R1030 Lower abdominal pain, unspecified: Secondary | ICD-10-CM | POA: Insufficient documentation

## 2023-11-13 DIAGNOSIS — E876 Hypokalemia: Secondary | ICD-10-CM | POA: Insufficient documentation

## 2023-11-13 LAB — URINALYSIS, ROUTINE W REFLEX MICROSCOPIC
Bilirubin Urine: NEGATIVE
Glucose, UA: NEGATIVE mg/dL
Ketones, ur: 5 mg/dL — AB
Nitrite: NEGATIVE
Protein, ur: 30 mg/dL — AB
Specific Gravity, Urine: 1.025 (ref 1.005–1.030)
pH: 5 (ref 5.0–8.0)

## 2023-11-13 LAB — COMPREHENSIVE METABOLIC PANEL WITH GFR
ALT: 8 U/L (ref 0–44)
AST: 15 U/L (ref 15–41)
Albumin: 4.3 g/dL (ref 3.5–5.0)
Alkaline Phosphatase: 35 U/L — ABNORMAL LOW (ref 38–126)
Anion gap: 9 (ref 5–15)
BUN: 13 mg/dL (ref 6–20)
CO2: 22 mmol/L (ref 22–32)
Calcium: 9.5 mg/dL (ref 8.9–10.3)
Chloride: 108 mmol/L (ref 98–111)
Creatinine, Ser: 0.66 mg/dL (ref 0.44–1.00)
GFR, Estimated: >60 mL/min (ref >60)
Glucose, Bld: 94 mg/dL (ref 70–99)
Potassium: 3.4 mmol/L — ABNORMAL LOW (ref 3.5–5.1)
Sodium: 139 mmol/L (ref 135–145)
Total Bilirubin: 0.9 mg/dL (ref 0.0–1.2)
Total Protein: 7.6 g/dL (ref 6.5–8.1)

## 2023-11-13 LAB — CBC
HCT: 41.8 % (ref 36.0–46.0)
Hemoglobin: 14.2 g/dL (ref 12.0–15.0)
MCH: 31.9 pg (ref 26.0–34.0)
MCHC: 34 g/dL (ref 30.0–36.0)
MCV: 93.9 fL (ref 80.0–100.0)
Platelets: 359 K/uL (ref 150–400)
RBC: 4.45 MIL/uL (ref 3.87–5.11)
RDW: 13.9 % (ref 11.5–15.5)
WBC: 8.4 K/uL (ref 4.0–10.5)
nRBC: 0 % (ref 0.0–0.2)

## 2023-11-13 LAB — LIPASE, BLOOD: Lipase: 40 U/L (ref 11–51)

## 2023-11-13 LAB — HCG, SERUM, QUALITATIVE: Preg, Serum: NEGATIVE

## 2023-11-13 MED ORDER — MORPHINE SULFATE (PF) 4 MG/ML IV SOLN
4.0000 mg | Freq: Once | INTRAVENOUS | Status: AC
  Administered 2023-11-13: 4 mg via INTRAVENOUS
  Filled 2023-11-13: qty 1

## 2023-11-13 MED ORDER — IOHEXOL 350 MG/ML SOLN
75.0000 mL | Freq: Once | INTRAVENOUS | Status: AC | PRN
  Administered 2023-11-13: 75 mL via INTRAVENOUS

## 2023-11-13 MED ORDER — ONDANSETRON HCL 4 MG/2ML IJ SOLN
4.0000 mg | Freq: Once | INTRAMUSCULAR | Status: AC
  Administered 2023-11-13: 4 mg via INTRAVENOUS
  Filled 2023-11-13: qty 2

## 2023-11-13 NOTE — ED Provider Notes (Signed)
 [facility] DEPT [facility] Emergency Department Provider Note MRN:  [id number]  Arrival date & time: 11/13/23     Chief Complaint   Abdominal Pain   History of Present Illness   Nancy Hurst is a 33 y.o. year-old female presents to the ED with chief complaint of lower abdominal pain that started yesterday around 4.  She states that she is starting her menstrual cycle.  She states that this is worse than her normal cramps.  She states that she has had some nausea as well as subjective fever.  She denies any successful treatments prior to arrival.  She denies dysuria.SABRA  History provided by patient.   Review of Systems  Pertinent positive and negative review of systems noted in HPI.    Physical Exam   Vitals:   11/13/23 0501  BP: 120/74  Pulse: 91  Resp: 20  Temp: 99 F (37.2 C)  SpO2: 100%    CONSTITUTIONAL:  non toxic-appearing, NAD NEURO:  Alert and oriented x 3, CN 3-12 grossly intact EYES:  eyes equal and reactive ENT/NECK:  Supple, no stridor  CARDIO:  normal rate, regular rhythm, appears well-perfused  PULM:  No respiratory distress, CTAB GI/GU:  non-distended, generalized lower abdominal tenderness/suprapubic tenderness MSK/SPINE:  No gross deformities, no edema, moves all extremities  SKIN:  no rash, atraumatic   *Additional and/or pertinent findings included in MDM below  Diagnostic and Interventional Summary    EKG Interpretation Date/Time:    Ventricular Rate:    PR Interval:    QRS Duration:    QT Interval:    QTC Calculation:   R Axis:      Text Interpretation:         Labs Reviewed  COMPREHENSIVE METABOLIC PANEL WITH GFR - Abnormal; Notable for the following components:      Result Value   Potassium 3.4 (*)    Alkaline Phosphatase 35 (*)    All other components within normal limits  URINALYSIS, ROUTINE W REFLEX MICROSCOPIC - Abnormal; Notable for the following components:   APPearance CLOUDY (*)    Hgb urine  dipstick LARGE (*)    Ketones, ur 5 (*)    Protein, ur 30 (*)    Leukocytes,Ua MODERATE (*)    Bacteria, UA RARE (*)    All other components within normal limits  CBC  HCG, SERUM, QUALITATIVE  LIPASE, BLOOD    CT ABDOMEN PELVIS W CONTRAST    (Results Pending)    Medications  morphine  (PF) 4 MG/ML injection 4 mg (has no administration in time range)  ondansetron  (ZOFRAN ) injection 4 mg (has no administration in time range)     Procedures  /  Critical Care Procedures  ED Course and Medical Decision Making  I have reviewed the triage vital signs, the nursing notes, and pertinent available records from the EMR.  Social Determinants Affecting Complexity of Care: Patient has no clinically significant social determinants affecting this chief complaint..   ED Course: Clinical Course as of 11/13/23 0621  [place] Nov 13, 2023  0621 Comprehensive metabolic panel(!) Mild hypokalemia at 3.4 [RB]  0621 CBC No significant leukocytosis or anemia [RB]  0621 hCG, serum, qualitative Pregnancy test negative [RB]  0621 Urinalysis, Routine w reflex microscopic -Urine, Clean Catch(!) Urinalysis is abnormal, patient states that she is starting her period, could be due to menstruation, but could be developing UTI. [RB]  0621 Patient does have significant lower abdominal tenderness, that seems to be more located over the right lower  quadrant, will check CT abdomen/pelvis. [RB]    Clinical Course User Index [RB] Vicky Charleston, PA-C    Medical Decision Making Patient here with lower abdominal pain that started yesterday.  Pain has been worsening.  She states that she is due to be starting her period..  She does have an abnormal urinalysis, but denies any dysuria or hematuria.  Suspect this is likely from her menstrual cycle.  No leukocytosis.    Amount and/or Complexity of Data Reviewed Labs: ordered. Radiology: ordered.  Risk Prescription drug management.          Consultants:    Treatment and Plan: Patient signed out to oncoming team, who will continue care.  Plan:  Follow-up on CT    Final Clinical Impressions(s) / ED Diagnoses  No diagnosis found.  ED Discharge Orders     None         Discharge Instructions Discussed with and Provided to Patient:   Discharge Instructions   None      Vicky Charleston, PA-C 11/13/23 0622    Haze Lonni PARAS, MD 11/13/23 [phone]

## 2023-11-13 NOTE — ED Triage Notes (Signed)
 Pt is here due to severe lower abdominal/pelvic pain for 10 days.  Pt is having vaginal bleeding which began today and she thinks it might be implantation bleeding but she describes this as spotting light, not like a period.  No nausea or vomting, no urinary symptoms.

## 2023-11-13 NOTE — Discharge Instructions (Addendum)
 Evaluation today revealed that you have a large cystic adnexal mass on the right side.  This is located in between your uterus and your right ovary.  Please follow-up with OB/GYN.  Dr. Burnard Moats has agreed to schedule appointment for you within the next couple of weeks.  If you develop worsening abdominal pain, fever, abnormal vaginal discharge or bleeding or any other concerning symptom please return to the ED for further evaluation.

## 2023-11-13 NOTE — ED Provider Notes (Signed)
 Patient signed out to me by PA Lamar Schlossman.  Here for 10 days of lower abdominal pain.  CT is pending.  Primary concern is rule out appendicitis. Physical Exam  BP 120/74 (BP Location: Right Arm)   Pulse 91   Temp 99 F (37.2 C)   Resp 20   SpO2 100%   Physical Exam Constitutional:      Appearance: Normal appearance.  HENT:     Head: Normocephalic.     Nose: Nose normal.  Eyes:     Conjunctiva/sclera: Conjunctivae normal.  Pulmonary:     Effort: Pulmonary effort is normal.  Neurological:     Mental Status: She is alert.  Psychiatric:        Mood and Affect: Mood normal.     Procedures  Procedures  ED Course / MDM   Clinical Course as of 11/13/23 9187  [place] Nov 13, 2023  0621 Comprehensive metabolic panel(!) Mild hypokalemia at 3.4 [RB]  0621 CBC No significant leukocytosis or anemia [RB]  0621 hCG, serum, qualitative Pregnancy test negative [RB]  0621 Urinalysis, Routine w reflex microscopic -Urine, Clean Catch(!) Urinalysis is abnormal, patient states that she is starting her period, could be due to menstruation, but could be developing UTI. [RB]  0621 Patient does have significant lower abdominal tenderness, that seems to be more located over the right lower quadrant, will check CT abdomen/pelvis. [RB]  [id number] R/o appy. CT pending. UA is abnormal but just started period. [JR]    Clinical Course User Index [JR] Melodi Happel K, PA-C [RB] Schlossman Lamar, PA-C   Medical Decision Making Amount and/or Complexity of Data Reviewed Labs: ordered. Decision-making details documented in ED Course. Radiology: ordered.  Risk Prescription drug management.   CT negative for appendicitis but did show sizable right adnexal cystic mass.  Discussed these findings with patient.  On reassessment she states she was feeling better without abdominal pain.  Discussed patient with Dr. Nicholaus of gynecology who recommended follow-up and has listed her for an appointment within the  next couples of weeks. Advised her to follow-up with OB/GYN.  Discussed return precautions.  Discharged in good condition.       Lang Norleen POUR, PA-C 11/13/23 [id number]    Doretha Folks, MD 11/16/23 [phone]

## 2023-11-13 NOTE — ED Notes (Signed)
 Patient transported to CT

## 2023-12-08 ENCOUNTER — Encounter: Payer: Self-pay | Admitting: Obstetrics and Gynecology

## 2023-12-27 ENCOUNTER — Encounter: Payer: Self-pay | Admitting: Family Medicine
